# Patient Record
Sex: Female | Born: 1935 | Race: White | Hispanic: No | Smoking: Former smoker
Health system: Southern US, Community
[De-identification: ages and names within clinical notes are randomized; demographics above are authoritative.]

## PROBLEM LIST (undated history)

## (undated) DIAGNOSIS — M25512 Pain in left shoulder: Secondary | ICD-10-CM

## (undated) DIAGNOSIS — R269 Unspecified abnormalities of gait and mobility: Secondary | ICD-10-CM

## (undated) DIAGNOSIS — E079 Disorder of thyroid, unspecified: Secondary | ICD-10-CM

## (undated) DIAGNOSIS — IMO0001 Reserved for inherently not codable concepts without codable children: Secondary | ICD-10-CM

## (undated) DIAGNOSIS — H9190 Unspecified hearing loss, unspecified ear: Secondary | ICD-10-CM

## (undated) DIAGNOSIS — Z720 Tobacco use: Secondary | ICD-10-CM

## (undated) DIAGNOSIS — N390 Urinary tract infection, site not specified: Secondary | ICD-10-CM

## (undated) DIAGNOSIS — I639 Cerebral infarction, unspecified: Secondary | ICD-10-CM

## (undated) DIAGNOSIS — Z993 Dependence on wheelchair: Secondary | ICD-10-CM

## (undated) DIAGNOSIS — D649 Anemia, unspecified: Secondary | ICD-10-CM

## (undated) DIAGNOSIS — J449 Chronic obstructive pulmonary disease, unspecified: Secondary | ICD-10-CM

## (undated) DIAGNOSIS — F329 Major depressive disorder, single episode, unspecified: Secondary | ICD-10-CM

## (undated) DIAGNOSIS — K219 Gastro-esophageal reflux disease without esophagitis: Secondary | ICD-10-CM

## (undated) DIAGNOSIS — F32A Depression, unspecified: Secondary | ICD-10-CM

## (undated) DIAGNOSIS — S322XXA Fracture of coccyx, initial encounter for closed fracture: Secondary | ICD-10-CM

## (undated) DIAGNOSIS — I10 Essential (primary) hypertension: Secondary | ICD-10-CM

## (undated) DIAGNOSIS — K56609 Unspecified intestinal obstruction, unspecified as to partial versus complete obstruction: Secondary | ICD-10-CM

## (undated) HISTORY — PX: CATARACT EXTRACTION: SUR2

## (undated) HISTORY — PX: HIP FRACTURE SURGERY: SHX118

## (undated) HISTORY — PX: GASTRECTOMY: SHX58

## (undated) HISTORY — PX: ABDOMINAL HYSTERECTOMY: SHX81

## (undated) HISTORY — PX: HERNIA REPAIR: SHX51

## (undated) HISTORY — PX: JOINT REPLACEMENT: SHX530

---

## 1998-05-07 ENCOUNTER — Encounter (INDEPENDENT_AMBULATORY_CARE_PROVIDER_SITE_OTHER): Payer: Self-pay | Admitting: *Deleted

## 1998-05-07 LAB — CONVERTED CEMR LAB

## 1998-05-14 ENCOUNTER — Other Ambulatory Visit: Admission: RE | Admit: 1998-05-14 | Discharge: 1998-05-14 | Payer: Self-pay | Admitting: *Deleted

## 1998-05-14 ENCOUNTER — Encounter: Admission: RE | Admit: 1998-05-14 | Discharge: 1998-05-14 | Payer: Self-pay | Admitting: Family Medicine

## 1998-06-20 ENCOUNTER — Encounter: Admission: RE | Admit: 1998-06-20 | Discharge: 1998-06-20 | Payer: Self-pay | Admitting: Family Medicine

## 1998-07-04 ENCOUNTER — Encounter: Admission: RE | Admit: 1998-07-04 | Discharge: 1998-07-04 | Payer: Self-pay | Admitting: Family Medicine

## 1998-07-23 ENCOUNTER — Encounter: Admission: RE | Admit: 1998-07-23 | Discharge: 1998-07-23 | Payer: Self-pay | Admitting: Family Medicine

## 1998-08-20 ENCOUNTER — Encounter: Admission: RE | Admit: 1998-08-20 | Discharge: 1998-08-20 | Payer: Self-pay | Admitting: Family Medicine

## 1998-10-29 ENCOUNTER — Encounter: Payer: Self-pay | Admitting: Emergency Medicine

## 1998-10-30 ENCOUNTER — Inpatient Hospital Stay (HOSPITAL_COMMUNITY): Admission: EM | Admit: 1998-10-30 | Discharge: 1998-10-31 | Payer: Self-pay | Admitting: Emergency Medicine

## 1998-11-07 ENCOUNTER — Encounter: Admission: RE | Admit: 1998-11-07 | Discharge: 1998-11-07 | Payer: Self-pay | Admitting: Family Medicine

## 1998-11-14 ENCOUNTER — Encounter: Admission: RE | Admit: 1998-11-14 | Discharge: 1998-11-14 | Payer: Self-pay | Admitting: Family Medicine

## 1998-11-21 ENCOUNTER — Encounter: Admission: RE | Admit: 1998-11-21 | Discharge: 1998-11-21 | Payer: Self-pay | Admitting: Family Medicine

## 1998-11-28 ENCOUNTER — Encounter: Admission: RE | Admit: 1998-11-28 | Discharge: 1998-11-28 | Payer: Self-pay | Admitting: Family Medicine

## 1998-12-04 ENCOUNTER — Encounter: Admission: RE | Admit: 1998-12-04 | Discharge: 1998-12-04 | Payer: Self-pay | Admitting: Family Medicine

## 1999-09-22 ENCOUNTER — Encounter: Admission: RE | Admit: 1999-09-22 | Discharge: 1999-09-22 | Payer: Self-pay | Admitting: Family Medicine

## 1999-09-29 ENCOUNTER — Encounter: Admission: RE | Admit: 1999-09-29 | Discharge: 1999-09-29 | Payer: Self-pay | Admitting: Sports Medicine

## 1999-10-06 ENCOUNTER — Encounter: Admission: RE | Admit: 1999-10-06 | Discharge: 1999-10-06 | Payer: Self-pay | Admitting: Sports Medicine

## 1999-10-21 ENCOUNTER — Encounter: Admission: RE | Admit: 1999-10-21 | Discharge: 1999-10-21 | Payer: Self-pay | Admitting: Family Medicine

## 1999-10-30 ENCOUNTER — Emergency Department (HOSPITAL_COMMUNITY): Admission: EM | Admit: 1999-10-30 | Discharge: 1999-10-30 | Payer: Self-pay | Admitting: Emergency Medicine

## 1999-10-30 ENCOUNTER — Encounter: Payer: Self-pay | Admitting: Emergency Medicine

## 1999-11-12 ENCOUNTER — Encounter: Admission: RE | Admit: 1999-11-12 | Discharge: 1999-11-12 | Payer: Self-pay | Admitting: Family Medicine

## 1999-11-12 ENCOUNTER — Encounter: Admission: RE | Admit: 1999-11-12 | Discharge: 1999-11-12 | Payer: Self-pay | Admitting: Sports Medicine

## 1999-11-12 ENCOUNTER — Encounter: Payer: Self-pay | Admitting: Sports Medicine

## 1999-12-09 ENCOUNTER — Encounter: Admission: RE | Admit: 1999-12-09 | Discharge: 1999-12-09 | Payer: Self-pay | Admitting: Family Medicine

## 1999-12-10 ENCOUNTER — Encounter: Payer: Self-pay | Admitting: Sports Medicine

## 1999-12-10 ENCOUNTER — Encounter: Admission: RE | Admit: 1999-12-10 | Discharge: 1999-12-10 | Payer: Self-pay | Admitting: Sports Medicine

## 2000-12-16 ENCOUNTER — Encounter: Admission: RE | Admit: 2000-12-16 | Discharge: 2000-12-16 | Payer: Self-pay | Admitting: Family Medicine

## 2001-01-26 ENCOUNTER — Emergency Department (HOSPITAL_COMMUNITY): Admission: EM | Admit: 2001-01-26 | Discharge: 2001-01-26 | Payer: Self-pay | Admitting: Emergency Medicine

## 2001-01-26 ENCOUNTER — Encounter: Payer: Self-pay | Admitting: Emergency Medicine

## 2003-05-15 ENCOUNTER — Emergency Department (HOSPITAL_COMMUNITY): Admission: EM | Admit: 2003-05-15 | Discharge: 2003-05-16 | Payer: Self-pay

## 2003-05-16 ENCOUNTER — Encounter: Payer: Self-pay | Admitting: Emergency Medicine

## 2005-03-03 ENCOUNTER — Observation Stay (HOSPITAL_COMMUNITY): Admission: EM | Admit: 2005-03-03 | Discharge: 2005-03-05 | Payer: Self-pay | Admitting: Emergency Medicine

## 2006-11-04 ENCOUNTER — Encounter (INDEPENDENT_AMBULATORY_CARE_PROVIDER_SITE_OTHER): Payer: Self-pay | Admitting: *Deleted

## 2011-04-19 ENCOUNTER — Emergency Department (HOSPITAL_COMMUNITY): Payer: Medicare Other

## 2011-04-19 ENCOUNTER — Inpatient Hospital Stay (HOSPITAL_COMMUNITY)
Admission: EM | Admit: 2011-04-19 | Discharge: 2011-04-23 | DRG: 689 | Disposition: A | Discharge status: Home Health | Payer: Medicare Other | Attending: Emergency Medicine | Admitting: Emergency Medicine

## 2011-04-19 DIAGNOSIS — G119 Hereditary ataxia, unspecified: Secondary | ICD-10-CM | POA: Diagnosis present

## 2011-04-19 DIAGNOSIS — E039 Hypothyroidism, unspecified: Secondary | ICD-10-CM | POA: Diagnosis present

## 2011-04-19 DIAGNOSIS — R5381 Other malaise: Secondary | ICD-10-CM | POA: Diagnosis present

## 2011-04-19 DIAGNOSIS — R1310 Dysphagia, unspecified: Secondary | ICD-10-CM | POA: Diagnosis present

## 2011-04-19 DIAGNOSIS — S32509A Unspecified fracture of unspecified pubis, initial encounter for closed fracture: Secondary | ICD-10-CM | POA: Diagnosis present

## 2011-04-19 DIAGNOSIS — I2510 Atherosclerotic heart disease of native coronary artery without angina pectoris: Secondary | ICD-10-CM | POA: Diagnosis present

## 2011-04-19 DIAGNOSIS — K279 Peptic ulcer, site unspecified, unspecified as acute or chronic, without hemorrhage or perforation: Secondary | ICD-10-CM | POA: Diagnosis present

## 2011-04-19 DIAGNOSIS — I10 Essential (primary) hypertension: Secondary | ICD-10-CM | POA: Diagnosis present

## 2011-04-19 DIAGNOSIS — G92 Toxic encephalopathy: Secondary | ICD-10-CM | POA: Diagnosis present

## 2011-04-19 DIAGNOSIS — K219 Gastro-esophageal reflux disease without esophagitis: Secondary | ICD-10-CM | POA: Diagnosis present

## 2011-04-19 DIAGNOSIS — R627 Adult failure to thrive: Secondary | ICD-10-CM | POA: Diagnosis present

## 2011-04-19 DIAGNOSIS — Z7982 Long term (current) use of aspirin: Secondary | ICD-10-CM

## 2011-04-19 DIAGNOSIS — G929 Unspecified toxic encephalopathy: Secondary | ICD-10-CM | POA: Diagnosis present

## 2011-04-19 DIAGNOSIS — E876 Hypokalemia: Secondary | ICD-10-CM | POA: Diagnosis present

## 2011-04-19 DIAGNOSIS — N12 Tubulo-interstitial nephritis, not specified as acute or chronic: Principal | ICD-10-CM | POA: Diagnosis present

## 2011-04-19 DIAGNOSIS — E785 Hyperlipidemia, unspecified: Secondary | ICD-10-CM | POA: Diagnosis present

## 2011-04-19 DIAGNOSIS — D649 Anemia, unspecified: Secondary | ICD-10-CM | POA: Diagnosis present

## 2011-04-19 LAB — DIFFERENTIAL
Basophils Absolute: 0 10*3/uL (ref 0.0–0.1)
Basophils Relative: 0 % (ref 0–1)
Lymphocytes Relative: 10 % — ABNORMAL LOW (ref 12–46)
Neutro Abs: 7.8 10*3/uL — ABNORMAL HIGH (ref 1.7–7.7)
Neutrophils Relative %: 78 % — ABNORMAL HIGH (ref 43–77)

## 2011-04-19 LAB — POCT I-STAT, CHEM 8
HCT: 35 % — ABNORMAL LOW (ref 36.0–46.0)
Hemoglobin: 11.9 g/dL — ABNORMAL LOW (ref 12.0–15.0)
Potassium: 3.3 mEq/L — ABNORMAL LOW (ref 3.5–5.1)
Sodium: 140 mEq/L (ref 135–145)
TCO2: 29 mmol/L (ref 0–100)

## 2011-04-19 LAB — CBC
HCT: 35 % — ABNORMAL LOW (ref 36.0–46.0)
Hemoglobin: 11.3 g/dL — ABNORMAL LOW (ref 12.0–15.0)
MCHC: 32.3 g/dL (ref 30.0–36.0)
RBC: 3.95 MIL/uL (ref 3.87–5.11)
WBC: 10.1 10*3/uL (ref 4.0–10.5)

## 2011-04-20 ENCOUNTER — Inpatient Hospital Stay (HOSPITAL_COMMUNITY): Payer: Medicare Other

## 2011-04-20 LAB — IRON AND TIBC
Iron: 13 ug/dL — ABNORMAL LOW (ref 42–135)
UIBC: 194 ug/dL

## 2011-04-20 LAB — FERRITIN: Ferritin: 191 ng/mL (ref 10–291)

## 2011-04-20 LAB — URINALYSIS, ROUTINE W REFLEX MICROSCOPIC
Nitrite: POSITIVE — AB
Protein, ur: 30 mg/dL — AB
Urobilinogen, UA: 1 mg/dL (ref 0.0–1.0)

## 2011-04-20 LAB — FOLATE: Folate: 12.2 ng/mL

## 2011-04-20 LAB — LIPID PANEL
Cholesterol: 155 mg/dL (ref 0–200)
Total CHOL/HDL Ratio: 4.2 RATIO
Triglycerides: 123 mg/dL (ref <150)

## 2011-04-20 LAB — URINE MICROSCOPIC-ADD ON

## 2011-04-20 LAB — CARDIAC PANEL(CRET KIN+CKTOT+MB+TROPI)
Relative Index: INVALID (ref 0.0–2.5)
Total CK: 11 U/L (ref 7–177)

## 2011-04-21 LAB — CBC
Hemoglobin: 10.4 g/dL — ABNORMAL LOW (ref 12.0–15.0)
MCH: 28.3 pg (ref 26.0–34.0)
MCV: 89.4 fL (ref 78.0–100.0)
Platelets: 203 10*3/uL (ref 150–400)
RBC: 3.67 MIL/uL — ABNORMAL LOW (ref 3.87–5.11)

## 2011-04-21 LAB — BASIC METABOLIC PANEL
CO2: 29 mEq/L (ref 19–32)
Calcium: 10.4 mg/dL (ref 8.4–10.5)
Creatinine, Ser: <0.47 mg/dL — ABNORMAL LOW (ref 0.50–1.10)
Glucose, Bld: 113 mg/dL — ABNORMAL HIGH (ref 70–99)

## 2011-04-22 LAB — CBC
Platelets: 224 10*3/uL (ref 150–400)
RBC: 3.83 MIL/uL — ABNORMAL LOW (ref 3.87–5.11)
WBC: 7.2 10*3/uL (ref 4.0–10.5)

## 2011-04-22 LAB — COMPREHENSIVE METABOLIC PANEL
ALT: 7 U/L (ref 0–35)
AST: 9 U/L (ref 0–37)
CO2: 27 mEq/L (ref 19–32)
Calcium: 10.3 mg/dL (ref 8.4–10.5)
Chloride: 106 mEq/L (ref 96–112)
Sodium: 138 mEq/L (ref 135–145)
Total Bilirubin: 0.2 mg/dL — ABNORMAL LOW (ref 0.3–1.2)

## 2011-04-23 LAB — URINE CULTURE

## 2011-05-25 NOTE — Discharge Summary (Signed)
April Mccoy, RIDGELY NO.:  [id number]  MEDICAL RECORD NO.:  [id number]  LOCATION:  3013                         FACILITY:  [facility]  PHYSICIAN:  Salman Wellen, DO         DATE OF BIRTH:  07/18/1929  DATE OF ADMISSION:  04/19/2011 DATE OF DISCHARGE:  04/23/2011                              DISCHARGE SUMMARY   ADMISSION DIAGNOSES:  Generalized weakness, failure to thrive, dysphasia, coronary artery disease, hypertension, gastroesophageal reflux disease, peptic ulcer disease, cerebellar ataxia, hyperlipidemia, urinary tract infection, normocytic anemia, and hypokalemia.  The patient was admitted, was given IV antibiotics.  Speech Pathology was consulted for the patient's dysphasia.  She was found to have a stage II dysphasia.  The consistencies for diet were changed and measures were taken to lower her risk of aspiration.  The patient was also found to be dehydrated.  She was given IV fluids.  She also had some thrush and was given Diflucan for that likely due to her malnutrition.  The patient was evaluated by Physical Therapy.  Their recommendation was for SNF as of her evaluation on April 22, 2011.  The patient was noted to have some difficulty due to her ataxia and difficulty with foot placement.  She has a flat footed gait pattern and that she required prompting for safety and hand placement with rolling walker.  The patient was ultimately diagnosed with pyelonephritis.  Her antibiotics were continued.  Her delirium cleared.  Her hypercalcemia cleared with IV fluids.  I spoke with the patient's family this morning and strongly recommended SNF for this patient versus 24 hours supervision and outpatient PT, OT and ST.  The daughter, [name], whom I spoke to this morning was in agreement and stated that she would counsel the patient to accept SNF.  I discussed this with the patient in detail and the patient seemed to agree at the time.  Since received notification  back from nursing, the patient's family now refuses SNF, the patient refuses SNF and she wishes to go home.  Nutrition has been by to assess her in the meantime.  They found her to be somewhat malnourished and recommended Ensure t.i.d.  This patient will be discharged to home today in fair condition.  She is discharged to her family who will provide 24 hours supervision.  The patient's ambulation is to be with assistance only with a rolling walker.  She is to have home health PT, OT and ST and a home health aide.  MEDICATIONS AT HOME: 1. Keflex 500 mg one p.o. b.i.d. times 4 days. 2. Aspirin 81 mg one p.o. daily. 3. Benazepril 10 mg one p.o. daily. 4. Lexapro 5 mg one p.o. daily. 5. Synthroid 25 mcg one p.o. daily. 6. Omeprazole 40 mg one p.o. daily. 7. Percocet 5/325 one p.o. q. 6 h. p.r.n. pain.  She is to follow up with her PCP in 2-4 weeks.  DIET:  Cardiac.  She is to have Ensure 1 can t.i.d. with meals.  DISCHARGE DIAGNOSES:  Pyelonephritis, delirium which is resolved, hypercalcemia secondary to dehydration, resolved, chronic dysphasia, generalized weakness, ataxic gait.  I spent 40 minutes on this discharge.  ______________________________ Fran Lowes, DO     AS/MEDQ  D:  04/23/2011  T:  04/23/2011  Job:  [id number]  Electronically Signed by Fran Lowes DO on 05/25/2011 10:35:26 PM

## 2011-05-25 NOTE — Discharge Summary (Signed)
  NAMELITSY, EPTING NO.:  [id number]  MEDICAL RECORD NO.:  [id number]  LOCATION:  3013                         FACILITY:  [facility]  PHYSICIAN:  Jeronimo Hellberg, DO         DATE OF BIRTH:  07/22/1929  DATE OF ADMISSION:  04/19/2011 DATE OF DISCHARGE:  04/23/2011                              DISCHARGE SUMMARY   ADDENDUM  The patient has a stage II dysphasia.  Aspiration precautions include mechanical soft diet, no straws, nectar thick liquids.  Pills are to be administered whole in applesauce.  The patient is to sit up at 90 degrees during meals and for 1 hour afterwards.  The patient is to follow swallows, follow each bite with free swallows.  She is to have small bites followed by small sips of liquids.  She is to eat slowly.  DISCHARGE MEDICATIONS:  Thick-It.  This is to be added to thin liquids to a nectar-thick consistency whenever she drinks liquids.          ______________________________ Fran Lowes, DO    AS/MEDQ  D:  04/23/2011  T:  04/23/2011  Job:  [id number]  Electronically Signed by Fran Lowes DO on 05/25/2011 10:35:29 PM

## 2011-06-19 NOTE — H&P (Signed)
NAMERENDA, POHLMAN NO.:  [id number]  MEDICAL RECORD NO.:  [id number]  LOCATION:  3733                         FACILITY:  [facility]  PHYSICIAN:  Lonia Blood, M.D.      DATE OF BIRTH:  02-20-1929  DATE OF ADMISSION:  04/19/2011 DATE OF DISCHARGE:                             HISTORY & PHYSICAL   PRIMARY CARE PHYSICIAN:  She is unassigned to [place].  PRESENTING COMPLAINT:  Fever, nausea, and weakness.  HISTORY OF PRESENT ILLNESS:  The patient is an 77 year old female who was apparently discharged from [facility] only recently.  She went home and staying with family.  She left 10 days ago and initially she was doing fine, but now has generalized weakness and poor oral intake today.  She has been lethargic according to the family.  She had some abdominal pain.  She had 1 episode of vomiting prior to arrival in the ED.  She has had fever but no chills and these all started only recently.  PAST MEDICAL HISTORY:  Significant for coronary artery disease, hypertension, GERD, hypothyroidism, history of peptic ulcer disease, history of cerebellar ataxia, hyperlipidemia, among other things.  ALLERGIES:  No known drug allergies.  CURRENT MEDICATIONS:  Include aspirin, benazepril, citalopram, Levothroid, and Prilosec.  SOCIAL HISTORY:  The patient currently lives in [place] since she left the [facility] 10 days ago.  She has people coming in to help out during the day.  No tobacco, alcohol, or IV drug use.  FAMILY HISTORY:  Noncontributory due to the patient's age.  REVIEW OF SYSTEMS:  Generalized weakness, otherwise all systems reviewed and negative except per HPI.  PHYSICAL EXAMINATION:  VITAL SIGNS:  T-max here 101, blood pressure 129/77 with pulse 86, respiratory rate 16, sats 94% on room air. GENERAL:  She is awake, alert, oriented, in no obvious distress, but looks weak globally. HEENT:  PERRLA.  EOMI.  No pallor, no jaundice, no rhinorrhea. NECK:   Supple.  No visible JVD, no lymphadenopathy, no carotid bruits. RESPIRATORY:  Good air entry bilaterally.  No significant wheezing or rales. CARDIOVASCULAR:  She has S1 and S2.  No murmur. ABDOMEN:  Soft, full, nontender with positive bowel sounds. EXTREMITIES:  No edema, cyanosis, or clubbing. SKIN:  No rashes or ulcers.  LABS:  Sodium is 140, potassium 3.3, chloride 102, BUN 17, creatinine 0.80 with glucose 118, and ionized calcium 1.37.  White count 10.1 with left shift ANC of 7.8, her hemoglobin is 11.3 with platelet count of 222.  Urinalysis showed cloudy urine with moderate hemoglobin, some proteinuria, positive nitrite, small leukocyte esterase.  Urine wbc's 7- 10, rbc's 3-6 with many bacteria.  Chest x-ray showed emphysematous changes in the lungs, but no active disease.  Head CT without contrast showed periventricular and corona radiata white matter hypodensities, compatible with chronic ischemic white matter change.  Otherwise, no significant change.  ASSESSMENT:  This is an 77 year old female, presenting with fever, weakness, and dysphagia.  She is apparently failed swallow evaluation in the ED.  From all indication, this is coming from urinary tract infection.  She may have actual pyelo based on her presentation with abdominal pain and CVA tenderness.  PLAN:  1. Generalized weakness.  We will treat underlying cause, hydrate the     patient, giver her some antibiotics, replete her potassium.  She     will get PT/OT consult.  She just left the rehab and may be able to     go back or at least go home with some Home Health.  We will get     PT/OT to make recommendations. 2. Failure to thrive.  The patient again had deteriorated apparently     since going home.  We will address her underlying problems and then     see if that improves her condition. 3. UTI/pyelonephritis.  I will keep her on IV Rocephin until we get     blood cultures and urine cultures back, then we can  narrow down her     antibiotic. 4. Hypothyroidism.  Check her TSH level and continue levothyroxine. 5. GERD.  Continue with PPI. 6. Hypertension, seems controlled.  Continue with current medications. 7. Normocytic anemia.  We will check anemia panel. 8. Hypokalemia.  Again, we will replete her potassium. 9. Dysphagia.  I will her keep her n.p.o. until she will get formal     speech therapy evaluation for swallowing.  This may all be due to     some weakness from her UTI and general medical condition and she     might improve after antibiotics and hydration.     Lonia Blood, M.D.     Verlin Grills  D:  04/20/2011  T:  04/20/2011  Job:  [id number]  Electronically Signed by Lonia Blood M.D. on 06/19/2011 02:54:33 PM

## 2014-08-13 ENCOUNTER — Observation Stay (HOSPITAL_COMMUNITY): Payer: Medicare Other

## 2014-08-13 ENCOUNTER — Encounter (HOSPITAL_COMMUNITY): Payer: Self-pay | Admitting: Emergency Medicine

## 2014-08-13 ENCOUNTER — Emergency Department (HOSPITAL_COMMUNITY): Payer: Medicare Other

## 2014-08-13 ENCOUNTER — Observation Stay (HOSPITAL_COMMUNITY)
Admission: EM | Admit: 2014-08-13 | Discharge: 2014-08-15 | Disposition: A | Discharge status: Home / Self Care | Payer: Medicare Other | Attending: Internal Medicine | Admitting: Internal Medicine

## 2014-08-13 DIAGNOSIS — F329 Major depressive disorder, single episode, unspecified: Secondary | ICD-10-CM | POA: Insufficient documentation

## 2014-08-13 DIAGNOSIS — R1310 Dysphagia, unspecified: Secondary | ICD-10-CM

## 2014-08-13 DIAGNOSIS — Z7982 Long term (current) use of aspirin: Secondary | ICD-10-CM | POA: Insufficient documentation

## 2014-08-13 DIAGNOSIS — M8580 Other specified disorders of bone density and structure, unspecified site: Secondary | ICD-10-CM | POA: Diagnosis present

## 2014-08-13 DIAGNOSIS — E079 Disorder of thyroid, unspecified: Secondary | ICD-10-CM | POA: Diagnosis present

## 2014-08-13 DIAGNOSIS — R269 Unspecified abnormalities of gait and mobility: Secondary | ICD-10-CM

## 2014-08-13 DIAGNOSIS — R072 Precordial pain: Principal | ICD-10-CM | POA: Insufficient documentation

## 2014-08-13 DIAGNOSIS — Z720 Tobacco use: Secondary | ICD-10-CM | POA: Insufficient documentation

## 2014-08-13 DIAGNOSIS — S2249XA Multiple fractures of ribs, unspecified side, initial encounter for closed fracture: Secondary | ICD-10-CM | POA: Diagnosis present

## 2014-08-13 DIAGNOSIS — Z87440 Personal history of urinary (tract) infections: Secondary | ICD-10-CM | POA: Diagnosis not present

## 2014-08-13 DIAGNOSIS — J441 Chronic obstructive pulmonary disease with (acute) exacerbation: Secondary | ICD-10-CM | POA: Insufficient documentation

## 2014-08-13 DIAGNOSIS — M549 Dorsalgia, unspecified: Secondary | ICD-10-CM | POA: Diagnosis present

## 2014-08-13 DIAGNOSIS — R7989 Other specified abnormal findings of blood chemistry: Secondary | ICD-10-CM

## 2014-08-13 DIAGNOSIS — J449 Chronic obstructive pulmonary disease, unspecified: Secondary | ICD-10-CM | POA: Diagnosis present

## 2014-08-13 DIAGNOSIS — D649 Anemia, unspecified: Secondary | ICD-10-CM | POA: Diagnosis not present

## 2014-08-13 DIAGNOSIS — J410 Simple chronic bronchitis: Secondary | ICD-10-CM

## 2014-08-13 DIAGNOSIS — K219 Gastro-esophageal reflux disease without esophagitis: Secondary | ICD-10-CM | POA: Diagnosis not present

## 2014-08-13 DIAGNOSIS — R079 Chest pain, unspecified: Secondary | ICD-10-CM | POA: Diagnosis present

## 2014-08-13 DIAGNOSIS — R109 Unspecified abdominal pain: Secondary | ICD-10-CM | POA: Diagnosis present

## 2014-08-13 DIAGNOSIS — R0781 Pleurodynia: Secondary | ICD-10-CM

## 2014-08-13 DIAGNOSIS — I10 Essential (primary) hypertension: Secondary | ICD-10-CM | POA: Diagnosis present

## 2014-08-13 DIAGNOSIS — Z79899 Other long term (current) drug therapy: Secondary | ICD-10-CM | POA: Insufficient documentation

## 2014-08-13 DIAGNOSIS — Z8673 Personal history of transient ischemic attack (TIA), and cerebral infarction without residual deficits: Secondary | ICD-10-CM | POA: Diagnosis not present

## 2014-08-13 DIAGNOSIS — F32A Depression, unspecified: Secondary | ICD-10-CM | POA: Diagnosis present

## 2014-08-13 HISTORY — DX: Depression, unspecified: F32.A

## 2014-08-13 HISTORY — DX: Major depressive disorder, single episode, unspecified: F32.9

## 2014-08-13 HISTORY — DX: Dependence on wheelchair: Z99.3

## 2014-08-13 HISTORY — DX: Essential (primary) hypertension: I10

## 2014-08-13 HISTORY — DX: Anemia, unspecified: D64.9

## 2014-08-13 HISTORY — DX: Urinary tract infection, site not specified: N39.0

## 2014-08-13 HISTORY — DX: Unspecified abnormalities of gait and mobility: R26.9

## 2014-08-13 HISTORY — DX: Fracture of coccyx, initial encounter for closed fracture: S32.2XXA

## 2014-08-13 HISTORY — DX: Disorder of thyroid, unspecified: E07.9

## 2014-08-13 HISTORY — DX: Gastro-esophageal reflux disease without esophagitis: K21.9

## 2014-08-13 HISTORY — DX: Cerebral infarction, unspecified: I63.9

## 2014-08-13 HISTORY — DX: Unspecified hearing loss, unspecified ear: H91.90

## 2014-08-13 HISTORY — DX: Pain in left shoulder: M25.512

## 2014-08-13 HISTORY — DX: Chronic obstructive pulmonary disease, unspecified: J44.9

## 2014-08-13 HISTORY — DX: Reserved for inherently not codable concepts without codable children: IMO0001

## 2014-08-13 HISTORY — DX: Unspecified intestinal obstruction, unspecified as to partial versus complete obstruction: K56.609

## 2014-08-13 HISTORY — DX: Tobacco use: Z72.0

## 2014-08-13 LAB — CBC
HCT: 39.7 % (ref 36.0–46.0)
Hemoglobin: 11.8 g/dL — ABNORMAL LOW (ref 12.0–15.0)
MCH: 25.2 pg — ABNORMAL LOW (ref 26.0–34.0)
MCHC: 29.7 g/dL — AB (ref 30.0–36.0)
MCV: 84.6 fL (ref 78.0–100.0)
PLATELETS: 238 10*3/uL (ref 150–400)
RBC: 4.69 MIL/uL (ref 3.87–5.11)
RDW: 13.7 % (ref 11.5–15.5)
WBC: 8 10*3/uL (ref 4.0–10.5)

## 2014-08-13 LAB — LIPID PANEL
CHOL/HDL RATIO: 4.7 ratio
CHOLESTEROL: 192 mg/dL (ref 0–200)
HDL: 41 mg/dL (ref >39)
LDL Cholesterol: 124 mg/dL — ABNORMAL HIGH (ref 0–99)
Triglycerides: 133 mg/dL (ref <150)
VLDL: 27 mg/dL (ref 0–40)

## 2014-08-13 LAB — RAPID URINE DRUG SCREEN, HOSP PERFORMED
Amphetamines: NOT DETECTED
Barbiturates: NOT DETECTED
Benzodiazepines: NOT DETECTED
Cocaine: NOT DETECTED
Opiates: NOT DETECTED
TETRAHYDROCANNABINOL: NOT DETECTED

## 2014-08-13 LAB — COMPREHENSIVE METABOLIC PANEL
ALBUMIN: 3.7 g/dL (ref 3.5–5.2)
ALT: 20 U/L (ref 0–35)
AST: 15 U/L (ref 0–37)
Alkaline Phosphatase: 104 U/L (ref 39–117)
Anion gap: 10 (ref 5–15)
BUN: 32 mg/dL — ABNORMAL HIGH (ref 6–23)
CO2: 26 meq/L (ref 19–32)
CREATININE: 0.65 mg/dL (ref 0.50–1.10)
Calcium: 10.4 mg/dL (ref 8.4–10.5)
Chloride: 105 mEq/L (ref 96–112)
GFR calc Af Amer: >90 mL/min (ref >90)
GFR, EST NON AFRICAN AMERICAN: 80 mL/min — AB (ref >90)
Glucose, Bld: 91 mg/dL (ref 70–99)
Potassium: 4.6 mEq/L (ref 3.7–5.3)
Sodium: 141 mEq/L (ref 137–147)
Total Bilirubin: 0.3 mg/dL (ref 0.3–1.2)
Total Protein: 7.1 g/dL (ref 6.0–8.3)

## 2014-08-13 LAB — TROPONIN I
Troponin I: <0.3 ng/mL (ref <0.30)
Troponin I: <0.3 ng/mL (ref <0.30)
Troponin I: <0.3 ng/mL (ref <0.30)

## 2014-08-13 LAB — I-STAT TROPONIN, ED: TROPONIN I, POC: 0.02 ng/mL (ref 0.00–0.08)

## 2014-08-13 LAB — HEMOGLOBIN A1C
Hgb A1c MFr Bld: 5.9 % — ABNORMAL HIGH (ref <5.7)
Mean Plasma Glucose: 123 mg/dL — ABNORMAL HIGH (ref <117)

## 2014-08-13 LAB — TSH: TSH: 1.99 u[IU]/mL (ref 0.350–4.500)

## 2014-08-13 LAB — D-DIMER, QUANTITATIVE: D-Dimer, Quant: 1.91 ug/mL-FEU — ABNORMAL HIGH (ref 0.00–0.48)

## 2014-08-13 LAB — PRO B NATRIURETIC PEPTIDE: Pro B Natriuretic peptide (BNP): 230.3 pg/mL (ref 0–450)

## 2014-08-13 MED ORDER — POLYETHYLENE GLYCOL 3350 17 G PO PACK: 17.0000 g | PACK | Freq: Every day | ORAL | Status: DC | PRN

## 2014-08-13 MED ORDER — ASPIRIN 81 MG PO CHEW
324.0000 mg | CHEWABLE_TABLET | Freq: Once | ORAL | Status: AC
  Administered 2014-08-13: 324 mg via ORAL
  Filled 2014-08-13: qty 4

## 2014-08-13 MED ORDER — ESCITALOPRAM OXALATE 10 MG PO TABS
5.0000 mg | ORAL_TABLET | Freq: Every day | ORAL | Status: DC
  Administered 2014-08-13 – 2014-08-15 (×3): 5 mg via ORAL
  Filled 2014-08-13 (×3): qty 1

## 2014-08-13 MED ORDER — ENSURE COMPLETE PO LIQD
237.0000 mL | Freq: Two times a day (BID) | ORAL | Status: DC
  Administered 2014-08-14 – 2014-08-15 (×2): 237 mL via ORAL

## 2014-08-13 MED ORDER — SODIUM CHLORIDE 0.9 % IJ SOLN
3.0000 mL | Freq: Two times a day (BID) | INTRAMUSCULAR | Status: DC
  Administered 2014-08-13 – 2014-08-14 (×2): 3 mL via INTRAVENOUS

## 2014-08-13 MED ORDER — NYSTATIN 100000 UNIT/GM EX POWD
Freq: Three times a day (TID) | CUTANEOUS | Status: DC
  Administered 2014-08-14 (×3): via TOPICAL
  Administered 2014-08-15: 1 via TOPICAL
  Filled 2014-08-13: qty 15

## 2014-08-13 MED ORDER — DM-GUAIFENESIN ER 30-600 MG PO TB12
1.0000 | ORAL_TABLET | Freq: Two times a day (BID) | ORAL | Status: DC
  Administered 2014-08-14 – 2014-08-15 (×2): 1 via ORAL
  Filled 2014-08-13 (×3): qty 1

## 2014-08-13 MED ORDER — IOHEXOL 350 MG/ML SOLN
100.0000 mL | Freq: Once | INTRAVENOUS | Status: AC | PRN
  Administered 2014-08-13: 100 mL via INTRAVENOUS

## 2014-08-13 MED ORDER — PANTOPRAZOLE SODIUM 40 MG PO TBEC
40.0000 mg | DELAYED_RELEASE_TABLET | Freq: Every day | ORAL | Status: DC
  Administered 2014-08-13 – 2014-08-15 (×3): 40 mg via ORAL
  Filled 2014-08-13 (×3): qty 1

## 2014-08-13 MED ORDER — NICOTINE 21 MG/24HR TD PT24
21.0000 mg | MEDICATED_PATCH | Freq: Every day | TRANSDERMAL | Status: DC
  Administered 2014-08-14 – 2014-08-15 (×2): 21 mg via TRANSDERMAL
  Filled 2014-08-13 (×2): qty 1

## 2014-08-13 MED ORDER — LEVOTHYROXINE SODIUM 25 MCG PO TABS
25.0000 ug | ORAL_TABLET | Freq: Every day | ORAL | Status: DC
  Administered 2014-08-14 – 2014-08-15 (×2): 25 ug via ORAL
  Filled 2014-08-13 (×2): qty 1

## 2014-08-13 MED ORDER — ASPIRIN EC 81 MG PO TBEC
81.0000 mg | DELAYED_RELEASE_TABLET | Freq: Every day | ORAL | Status: DC
  Administered 2014-08-14 – 2014-08-15 (×2): 81 mg via ORAL
  Filled 2014-08-13 (×2): qty 1

## 2014-08-13 MED ORDER — IPRATROPIUM-ALBUTEROL 0.5-2.5 (3) MG/3ML IN SOLN: 3.0000 mL | Freq: Four times a day (QID) | RESPIRATORY_TRACT | Status: DC | PRN

## 2014-08-13 MED ORDER — ACETAMINOPHEN 650 MG RE SUPP: 650.0000 mg | Freq: Four times a day (QID) | RECTAL | Status: DC | PRN

## 2014-08-13 MED ORDER — ACETAMINOPHEN 325 MG PO TABS: 650.0000 mg | ORAL_TABLET | Freq: Four times a day (QID) | ORAL | Status: DC | PRN

## 2014-08-13 MED ORDER — BENAZEPRIL HCL 5 MG PO TABS
5.0000 mg | ORAL_TABLET | Freq: Every day | ORAL | Status: DC
  Administered 2014-08-14 – 2014-08-15 (×2): 5 mg via ORAL
  Filled 2014-08-13 (×3): qty 1

## 2014-08-13 MED ORDER — ENOXAPARIN SODIUM 40 MG/0.4ML ~~LOC~~ SOLN
40.0000 mg | SUBCUTANEOUS | Status: DC
  Administered 2014-08-13 – 2014-08-14 (×2): 40 mg via SUBCUTANEOUS
  Filled 2014-08-13 (×2): qty 0.4

## 2014-08-13 MED ORDER — IPRATROPIUM-ALBUTEROL 0.5-2.5 (3) MG/3ML IN SOLN
3.0000 mL | RESPIRATORY_TRACT | Status: DC
  Administered 2014-08-13: 3 mL via RESPIRATORY_TRACT
  Filled 2014-08-13: qty 3

## 2014-08-13 NOTE — ED Notes (Signed)
Pt returned from xray; coffee given to family; call bell at bedside.

## 2014-08-13 NOTE — ED Notes (Signed)
PT in xray 

## 2014-08-13 NOTE — ED Notes (Signed)
Pt reports waking up with centralized chest pain; hurts with palpation and deep inspiration. Also c/o right sided flank pain from where she fell a week ago. Pt lives alone at home.

## 2014-08-13 NOTE — ED Provider Notes (Signed)
CSN: [id number]     Arrival date & time 08/13/14  [id number] History   First MD Initiated Contact with Patient 08/13/14 [phone]     Chief Complaint  Patient presents with  . Chest Pain     (Consider location/radiation/quality/duration/timing/severity/associated sxs/prior Treatment) HPI  The patient reports right sided chest pain for a week. The patient had a fall approximately a week ago. The pain is worse with pressure and with movement. She reports this morning she awoke with a central chest pain which was new for her. It has a pressure quality to it. She denies associated symptoms. The patient reports she has had a cough for "a long time". She does cough up mucus intermittently. She denies fever. The patient denies increased short of breath. The patient endorses nausea but denies vomiting or diarrhea. She denies abdominal pain. The patient has a long smoking history. She reports she has had to cut back over the past year. Now she reports she only smokes intermittently. Past Medical History  Diagnosis Date  . Hypertension   . Stroke     with h/o dysphagia post stroke  . Thyroid disease   . Left shoulder pain     DJD  . UTI (lower urinary tract infection)     Enterobacter aerogenes  . COPD (chronic obstructive pulmonary disease)   . GERD (gastroesophageal reflux disease)   . Abnormality of gait   . Hard of hearing   . Anemia   . Depression   . Tobacco abuse    Past Surgical History  Procedure Laterality Date  . Hernia repair    . Hip fracture surgery    . Joint replacement    . Gastrectomy    . Other surgical history      hysterectomy    Family History  Problem Relation Age of Onset  . Heart disease Mother   . Hypertension Mother   . Alcohol abuse Father   . Cancer      sister, brother, daughter x 2   History  Substance Use Topics  . Smoking status: Current Some Day Smoker  . Smokeless tobacco: Not on file  . Alcohol Use: Not on file   OB History    No data available      Review of Systems 10 Systems reviewed and are negative for acute change except as noted in the HPI.    Allergies  Review of patient's allergies indicates no known allergies.  Home Medications   Prior to Admission medications   Medication Sig Start Date End Date Taking? Authorizing Provider  aspirin EC 81 MG tablet Take 81 mg by mouth daily.   Yes Historical Provider, MD  benazepril (LOTENSIN) 10 MG tablet Take 5 mg by mouth. 02/28/14  Yes Historical Provider, MD  ciprofloxacin (CIPRO) 500 MG tablet TAKE 1 TABLET BY MOUTH TWO TIMES A DAY. FOR 10 DAYS 04/26/14  Yes Historical Provider, MD  escitalopram (LEXAPRO) 5 MG tablet Take 5 mg by mouth daily.   Yes Historical Provider, MD  levothyroxine (SYNTHROID, LEVOTHROID) 25 MCG tablet Take 25 mcg by mouth daily before breakfast.   Yes Historical Provider, MD  nystatin (MYCOSTATIN) powder Apply to affected area 3 times daily 02/28/14 02/28/15 Yes Historical Provider, MD  omeprazole (PRILOSEC) 40 MG capsule Take 40 mg by mouth daily.   Yes Historical Provider, MD  polyethylene glycol (MIRALAX / GLYCOLAX) packet Take by mouth. 11/28/13  Yes Historical Provider, MD  traMADol (ULTRAM) 50 MG tablet Take 50 mg by mouth  every 6 (six) hours as needed (for back pain).   Yes Historical Provider, MD  alendronate (FOSAMAX) 70 MG tablet  07/31/14   Historical Provider, MD  amoxicillin (AMOXIL) 875 MG tablet  06/30/14   Historical Provider, MD  ipratropium-albuterol (DUONEB) 0.5-2.5 (3) MG/3ML SOLN  07/25/14   Historical Provider, MD   BP 125/67 mmHg  Pulse 77  Temp(Src) 98.3 F (36.8 C) (Oral)  Resp 25  SpO2 89% Physical Exam  Constitutional: She is oriented to person, place, and time. She appears well-nourished. No distress.  HENT:  Head: Normocephalic and atraumatic.  Nose: Nose normal.  Mouth/Throat: Oropharynx is clear and moist.  Eyes: EOM are normal. Pupils are equal, round, and reactive to light.  Neck: Neck supple.  Cardiovascular: Normal  rate, regular rhythm, normal heart sounds and intact distal pulses.   Pulmonary/Chest: Effort normal. She has wheezes.  The patient has bilateral expiratory wheeze. Soft breath sounds at the bases consistent with COPD. Chest wall has reproducible tenderness on the right lateral and right anterior portions at approximately the fourth rib through the 10th. There is no crepitus present. No objective abrasions or contusions.  Abdominal: Soft. Bowel sounds are normal. She exhibits no distension and no mass. There is no tenderness. There is no rebound and no guarding.  Musculoskeletal: Normal range of motion. She exhibits no edema or tenderness.  Neurological: She is alert and oriented to person, place, and time. No cranial nerve deficit. Coordination normal.  Skin: Skin is warm and dry.  Psychiatric: She has a normal mood and affect.    ED Course  Procedures (including critical care time) Labs Review Labs Reviewed  CBC - Abnormal; Notable for the following:    Hemoglobin 11.8 (*)    MCH 25.2 (*)    MCHC 29.7 (*)    All other components within normal limits  COMPREHENSIVE METABOLIC PANEL - Abnormal; Notable for the following:    BUN 32 (*)    GFR calc non Af Amer 80 (*)    All other components within normal limits  PRO B NATRIURETIC PEPTIDE  TROPONIN I  TROPONIN I  TROPONIN I  URINE RAPID DRUG SCREEN (HOSP PERFORMED)  TSH  I-STAT TROPOININ, ED    Imaging Review Dg Chest 2 View  08/13/2014   CLINICAL DATA:  Chest pain  EXAM: CHEST  2 VIEW  COMPARISON:  04/19/2011  FINDINGS: Atelectasis versus airspace disease at the left base. Upper normal heart size. No pneumothorax. No pleural effusion. Osteopenia. Kyphosis of the upper thoracic spine is stable. No new compression deformities.  IMPRESSION: Atelectasis versus airspace disease at the left base.   Electronically Signed   By: Maryclare Bean M.D.   On: 08/13/2014 08:39     EKG Interpretation   Date/Time:  Tuesday August 13 2014 07:46:36  EST Ventricular Rate:  77 PR Interval:  164 QRS Duration: 85 QT Interval:  398 QTC Calculation: 450 R Axis:   8 Text Interpretation:  Sinus rhythm Borderline repolarization abnormality  no STEMI. no change Confirmed by Donnald Garre, MD, Lebron Conners [phone]) on 08/13/2014  7:54:58 AM     Consult: Patient's case is consult with internal medicine for admission. MDM   Final diagnoses:  Precordial pain  Simple chronic bronchitis    The patient has developed chest pain this morning that is acute in onset and new for her. Her daughter reports that she had a pale appearance and it is very atypical for her to wish to come to the hospital for  any condition. At this point time there is concern for possible ischemia. Her first set of cardiac enzymes and EKG do not show acute ischemic changes. The patient has COPD and at this point does not have acute respiratory distress. Chest x-ray does not show infiltrate and no fever has been reported.    Arby BarretteMarcy Maekayla Giorgio, MD 08/13/14 [phone]

## 2014-08-13 NOTE — ED Notes (Signed)
Admitting MD at bedside; snack given.

## 2014-08-13 NOTE — H&P (Signed)
Date: 08/13/2014               Patient Name:  April Mccoy MRN: [id number]  DOB: [birth year] Age / Sex: 80 y.o., female   PCP: [facility]         Medical Service: Internal Medicine Teaching Service         Attending Physician: Dr. Rogelia Boga    First Contact: Dr. Tasia Catchings Pager: [phone]  Second Contact: Dr. Delane Ginger  Pager: [phone]       After Hours (After 5p/  First Contact Pager: [phone]  weekends / holidays): Second Contact Pager: [phone]   Chief Complaint: chest pain, back pain, right flank pain  History of Present Illness:  80 y.o PMH stroke, HTN, tobacco abuse, GERD, depression anemia, dysphagia, osteopenia with fractures (osteoporosis), falls.  She presented after waking up with mid chest pain which is now improve but initially "hurt" this am was 10/10 now 5/10. Chest pain was constant w/o radiation. Nothing made the chest pain worse or better.  She had shortness of breath initially with chest pain that is now improved/resolved.  She denies nausea/vomiting or sweating.  Daughter noted paleness to skin associated with episode of chest pain this am.  Ms. Padia was scared this am and told her daughter she wanted to come to the hospital which she never wants to come in to the hospital so her daughter was concerned as well.   She also reports other symptoms.  She has a chronic cough with white mucous intermittently but her cough is worse the last couple of days.  She complains of right flank/upper quadrant pain noticed since fall within the las week. Pain is worse with with coughing and deep breathing (nothing makes better).  She has a history of falls with a recent fall a few days ago when her wheelchair flipped over while she was getting up from the toilet.  Patient also complains of mid back pain resolved now was 3/10 now 0/10.  She has had this back pain previously relieved in the past with Tylenol and nothing makes worse but it has been present for years.  ED RN checked BP  in both arms and it was 127/67 and 125/64 per report.    Meds: Current Facility-Administered Medications  Medication Dose Route Frequency Provider Last Rate Last Dose  . ipratropium-albuterol (DUONEB) 0.5-2.5 (3) MG/3ML nebulizer solution 3 mL  3 mL Nebulization Q6H PRN Annett Gula, MD       Current Outpatient Prescriptions  Medication Sig Dispense Refill  . acetaminophen (TYLENOL) 325 MG tablet Take 650 mg by mouth every 6 (six) hours as needed for mild pain.    Marland Kitchen alendronate (FOSAMAX) 70 MG tablet Take 70 mg by mouth once a week.   4  . aspirin EC 81 MG tablet Take 81 mg by mouth daily.    . benazepril (LOTENSIN) 10 MG tablet Take 5 mg by mouth daily.     Marland Kitchen escitalopram (LEXAPRO) 5 MG tablet Take 5 mg by mouth daily.    Marland Kitchen ipratropium-albuterol (DUONEB) 0.5-2.5 (3) MG/3ML SOLN Inhale 3 mLs into the lungs daily.   5  . levothyroxine (SYNTHROID, LEVOTHROID) 25 MCG tablet Take 25 mcg by mouth daily before breakfast.    . omeprazole (PRILOSEC) 40 MG capsule Take 40 mg by mouth daily.    . traMADol (ULTRAM) 50 MG tablet Take 50 mg by mouth every 6 (six) hours as needed (for back pain).    Pt is not  taking Ultram at home  Fosamax is on Mondays Duonebs only doing 1x per day    Allergies: Allergies as of 08/13/2014  . (No Known Allergies)   Past Medical History  Diagnosis Date  . Hypertension   . Stroke     with h/o dysphagia post stroke  . Thyroid disease   . Left shoulder pain     DJD  . UTI (lower urinary tract infection)     Enterobacter aerogenes  . COPD (chronic obstructive pulmonary disease)   . GERD (gastroesophageal reflux disease)   . Abnormality of gait   . Hard of hearing   . Anemia   . Depression   . Tobacco abuse   . Wheelchair bound   . Bowel obstruction   . Fracture of coccyx     history   Past Surgical History  Procedure Laterality Date  . Hernia repair    . Hip fracture surgery    . Joint replacement      b/l hips  . Gastrectomy      for ulcer  .  Other surgical history      hysterectomy   . Other surgical history     Family History  Problem Relation Age of Onset  . Heart disease Mother     died age 78 MI  . Hypertension Mother   . Alcohol abuse Father   . Cancer      sister, 1 brother (lung) 1 brother (colon), daughter x 2 deceased 1 died age 78 pancreatitic another died age 78 lung  . Diabetes Son   . COPD Son    History   Social History  . Marital Status: Widowed    Spouse Name: N/A    Number of Children: N/A  . Years of Education: N/A   Occupational History  . Not on file.   Social History Main Topics  . Smoking status: Current Some Day Smoker  . Smokeless tobacco: Not on file  . Alcohol Use: Not on file  . Drug Use: Not on file  . Sexual Activity: Not on file   Other Topics Concern  . Not on file   Social History Narrative   [place]   Smoking since age 80 3 packs will last 3-4 days, trying to cut back and quit   1 living daughter, 2 daughters deceased    3 living sons    Unable to do ADLs daughter Darel HongJudy is helping with these at home 6 days per week from 7:30 a to 5:30/6 p       Review of Systems: General: denies fever/chills/appetite changes/sweating HEENT: +hard of hearing  Cardiac: +chest pain improved. Initially chest pain "hurt" this am was 10/10 now 5/10. CP was constant w/o radiation. Nothing made the chest pain worse or better.   Pulm: +chronic cough with white mucous intermittently but cough worse the last couple of days, denies sob now but was sob with chest pain earlier Abd/GU: denies nausea or vomiting or diarrhea, +right flank pain from fall a week ago, denies abdominal pain, denies dysuria, + urinary incontinence (wears depends) and increased frequency and urine odor Ext: denies calf pain b/l Neuro: +falls a few days ago when wheelchair flipped over while pt getting up from the toilet, +resolved dizziness, denies syncope, +right leg weakness (chronic and worsening) MSK:  +mid back pain resolved was 3/10 now 0/10 (relieved in the past with Tylenol nothing makes worse but been present for years), +right flank pain with  coughing and deep breathing (nothing makes better) Skin: paleness noted with chest pain this am Other:+wheelchair bound  Physical Exam: Vital signs on exam 88, 91% on room air, 16-22-->24, 113/77 (81) Blood pressure 121/90, pulse 88, temperature 98.3 F (36.8 C), temperature source Oral, resp. rate 18, SpO2 100 %. Vitals reviewed. General: resting in bed, NAD, frail appearing HEENT: PERRL b/l, [place]/at, no scleral icterus Cardiac: RRR, no rubs, murmurs or gallops, no chest wall ttp Pulm: clear to auscultation bilaterally, no wheezes, rales, or rhonchi Abd: soft, mildly tender to palpation right flank, nondistended, BS present Ext: warm and well perfused, no pedal edema MSK: no ttp in mid back Neuro: alert and oriented X3, cranial nerves II-XII grossly intact, moving all 4 extremities though pt is physically deconditioned.     Lab results: Basic Metabolic Panel:  Recent Labs  40/98/1111/04/21 0806  NA 141  K 4.6  CL 105  CO2 26  GLUCOSE 91  BUN 32*  CREATININE 0.65  CALCIUM 10.4   Liver Function Tests:  Recent Labs  08/13/14 0806  AST 15  ALT 20  ALKPHOS 104  BILITOT 0.3  PROT 7.1  ALBUMIN 3.7   CBC:  Recent Labs  08/13/14 0806  WBC 8.0  HGB 11.8*  HCT 39.7  MCV 84.6  PLT 238   Cardiac Enzymes:  Recent Labs  08/13/14 0915  TROPONINI <0.30   BNP:  Recent Labs  08/13/14 0806  PROBNP 230.3   D-Dimer: No results for input(s): DDIMER in the last 72 hours.  Hemoglobin A1C: No results for input(s): HGBA1C in the last 72 hours. Fasting Lipid Panel: No results for input(s): CHOL, HDL, LDLCALC, TRIG, CHOLHDL, LDLDIRECT in the last 72 hours. Thyroid Function Tests: No results for input(s): TSH, T4TOTAL, FREET4, T3FREE, THYROIDAB in the last 72 hours.  Urine Drug Screen: Drugs of Abuse  No results found for:  LABOPIA, COCAINSCRNUR, LABBENZ, AMPHETMU, THCU, LABBARB   Misc. Labs: UDS Trop x 2  Lipid HA1C  TSH D dimer UA  Imaging results:  Dg Chest 2 View  08/13/2014   CLINICAL DATA:  Chest pain  EXAM: CHEST  2 VIEW  COMPARISON:  04/19/2011  FINDINGS: Atelectasis versus airspace disease at the left base. Upper normal heart size. No pneumothorax. No pleural effusion. Osteopenia. Kyphosis of the upper thoracic spine is stable. No new compression deformities.  IMPRESSION: Atelectasis versus airspace disease at the left base.   Electronically Signed   By: Maryclare BeanArt  Hoss M.D.   On: 08/13/2014 08:39    Other results: EKG: NSR, rate 77, nl axis, nl intervals, no acute ST changes, no LVH, artifact, pending repeat EKG  Assessment & Plan by Problem: 80 y.o presented with chest pain, right flank pain and back pain.   #Chest pain -Unclear etiology at this time.  Will trend troponin x 3 to r/o ACS (negative x 1). Timi score 2 (for aspirin use and age).  BP in both arms almost equal 127/67 and 125/64 per RN report in the ED and no mediastinal widening on CXR less likely aortic dissection. Will check D dimer to r/o thromboembolism (at 3:15 pm). Wells score low risk and Geneva moderate risk PE.  CP does not seem MSK as not reproducible on exam.  Pt denies heartburn at the time of chest pain.   -admit to tele-obs -check UDS, lipid, HA1C, pending repeat EKG a lot of artifact in initial EKG, f/u d dimer if + get further CTA chest  -prn O2 if needed  -  Given Aspirin 324 in the ED. Continue Aspirin 81 mg qd in am  #Right flank pain  -with recent fall ? Rib fracture -will do rib xray right -prn Tylenol, incentive spirometry  #mid back pain -kyhphosis may be etiology no evidence of compression fracture on CXR -prn Tylenol 650 mg q6 prn  #dizziness  -mentioned initially in the ED.  Pt states the room was spinning during last fall so may be BPPV but will check orthostatics as well  #COPD (chronic obstructive  pulmonary disease) -stable. No acute exacerbation.   -no pfts on file per chart review has COPD -Duonebs q 6 prn, pt takes daily at home, given Mucinex DM for cough  #Abnormality of gait/physical deconditioning  -PT/OT prior to discharge for falls. Pt prefers to stay at home with care of daughter 6 days of week for 9 hours.    #Hypertension, uncontrolled.  -BP as high as 140/111.  Pt had not taken BP medication this am -will resume home Lotensin 5 mg qd, verify dose per pharm  #GERD (gastroesophageal reflux disease) -on Prilosec 40 mg at home given Protonix 40 mg qd now to continue   #Normocytic Anemia -at baseline  #History of stroke -continue Aspirin 81   #Thyroid disease -will check tsh continue synthryoid 25 mcg verify correct dose   #History of osteopenia with fractures likely osteoporosis  -Pt takes Fosamax on Mondays  -DEXA 07/2014 per care everywhere but unable to see result -will check Vitamin D level in am  #Depression -continued Lexapro 5 mg, verify dose  #Tobacco abuse -counseling cessation -Nicotine 21 mcg patch  #Chronic constipation -Miralax qd prn  #history of dysphagia  -previously barium swallow indicated aspiration in 07/2013 and rec SLP at that time.  ? If needs speech now with h/o aspiration -pt currently does not have special diet though does have trouble swallowing food (i.e meats)  #DVT px -Lov, scds   #F/E/N -no IVF -BMET in am -HH diet   Dispo: Disposition is deferred at this time, awaiting improvement of current medical problems. Anticipated discharge in approximately 1-2 day(s).   The patient does have a current PCP [facility]-Dr. Hamrick) and does not need an [facility] hospital follow-up appointment after discharge.  The patient does not have transportation limitations that hinder transportation to clinic appointments.  Signed: Annett Gula, MD [phone] 08/13/2014, 11:47 AM

## 2014-08-13 NOTE — ED Notes (Signed)
TSH cannot be added on. Gold top needs to be drawn.

## 2014-08-13 NOTE — Progress Notes (Signed)
Order received to perform RN stroke swallow screen. Unable to perform due to patient's history of dysphagia. MD notified. NPO order placed pending speech eval; SLP evaluation already ordered.

## 2014-08-14 ENCOUNTER — Observation Stay (HOSPITAL_COMMUNITY): Payer: Medicare Other

## 2014-08-14 DIAGNOSIS — J441 Chronic obstructive pulmonary disease with (acute) exacerbation: Secondary | ICD-10-CM | POA: Diagnosis not present

## 2014-08-14 DIAGNOSIS — M8580 Other specified disorders of bone density and structure, unspecified site: Secondary | ICD-10-CM | POA: Diagnosis present

## 2014-08-14 DIAGNOSIS — I10 Essential (primary) hypertension: Secondary | ICD-10-CM | POA: Diagnosis not present

## 2014-08-14 DIAGNOSIS — S2249XA Multiple fractures of ribs, unspecified side, initial encounter for closed fracture: Secondary | ICD-10-CM | POA: Diagnosis present

## 2014-08-14 DIAGNOSIS — R072 Precordial pain: Secondary | ICD-10-CM | POA: Diagnosis not present

## 2014-08-14 DIAGNOSIS — R1310 Dysphagia, unspecified: Secondary | ICD-10-CM

## 2014-08-14 DIAGNOSIS — R0789 Other chest pain: Secondary | ICD-10-CM

## 2014-08-14 DIAGNOSIS — K219 Gastro-esophageal reflux disease without esophagitis: Secondary | ICD-10-CM | POA: Diagnosis not present

## 2014-08-14 DIAGNOSIS — M810 Age-related osteoporosis without current pathological fracture: Secondary | ICD-10-CM

## 2014-08-14 DIAGNOSIS — S2241XA Multiple fractures of ribs, right side, initial encounter for closed fracture: Secondary | ICD-10-CM

## 2014-08-14 LAB — BASIC METABOLIC PANEL
Anion gap: 10 (ref 5–15)
BUN: 23 mg/dL (ref 6–23)
CALCIUM: 9.6 mg/dL (ref 8.4–10.5)
CO2: 26 meq/L (ref 19–32)
Chloride: 103 mEq/L (ref 96–112)
Creatinine, Ser: 0.67 mg/dL (ref 0.50–1.10)
GFR calc Af Amer: >90 mL/min (ref >90)
GFR, EST NON AFRICAN AMERICAN: 79 mL/min — AB (ref >90)
Glucose, Bld: 83 mg/dL (ref 70–99)
Potassium: 3.9 mEq/L (ref 3.7–5.3)
Sodium: 139 mEq/L (ref 137–147)

## 2014-08-14 LAB — URINALYSIS, ROUTINE W REFLEX MICROSCOPIC
Bilirubin Urine: NEGATIVE
GLUCOSE, UA: NEGATIVE mg/dL
HGB URINE DIPSTICK: NEGATIVE
Ketones, ur: NEGATIVE mg/dL
Leukocytes, UA: NEGATIVE
Nitrite: NEGATIVE
Protein, ur: NEGATIVE mg/dL
Urobilinogen, UA: 1 mg/dL (ref 0.0–1.0)
pH: 6.5 (ref 5.0–8.0)

## 2014-08-14 LAB — VITAMIN D 25 HYDROXY (VIT D DEFICIENCY, FRACTURES): Vit D, 25-Hydroxy: 16 ng/mL — ABNORMAL LOW (ref 30–100)

## 2014-08-14 MED ORDER — CALCIUM CARBONATE 1250 (500 CA) MG PO TABS: 1.0000 | ORAL_TABLET | Freq: Two times a day (BID) | ORAL | Status: AC

## 2014-08-14 MED ORDER — VITAMIN D (ERGOCALCIFEROL) 1.25 MG (50000 UNIT) PO CAPS: 50000.0000 [IU] | ORAL_CAPSULE | ORAL | Status: DC

## 2014-08-14 MED ORDER — CALCIUM CARBONATE ANTACID 420 MG PO CHEW: 420.0000 mg | CHEWABLE_TABLET | Freq: Two times a day (BID) | ORAL | Status: DC

## 2014-08-14 MED ORDER — VITAMIN D (ERGOCALCIFEROL) 1.25 MG (50000 UNIT) PO CAPS
50000.0000 [IU] | ORAL_CAPSULE | ORAL | Status: DC
Start: 2014-08-14 — End: 2014-08-15
  Administered 2014-08-14: 50000 [IU] via ORAL
  Filled 2014-08-14: qty 1

## 2014-08-14 MED ORDER — CALCIUM CARBONATE 1250 (500 CA) MG PO TABS
1.0000 | ORAL_TABLET | Freq: Two times a day (BID) | ORAL | Status: DC
  Administered 2014-08-14 – 2014-08-15 (×2): 500 mg via ORAL
  Filled 2014-08-14 (×2): qty 1

## 2014-08-14 MED ORDER — NICOTINE 21 MG/24HR TD PT24: 21.0000 mg | MEDICATED_PATCH | Freq: Every day | TRANSDERMAL | Status: DC

## 2014-08-14 NOTE — Discharge Summary (Signed)
Name: April Mccoy MRN: [id number] DOB: 09/06/1929 80 y.o. PCP: Ailene RavelMaura L Hamrick, MD ______________________________________________________________  Date of Admission: 08/13/2014  7:36 AM Date of Discharge: 08/14/2014 Attending Physician: Burns SpainElizabeth A Butcher, MD   Discharge Diagnosis: Patient Active Problem List   Diagnosis Date Noted  . Fracture of multiple ribs 08/14/2014  . Osteopenia 08/14/2014  . Chest pain 08/13/2014  . GERD (gastroesophageal reflux disease) 08/13/2014  . Anemia 08/13/2014  . Depression 08/13/2014  . History of stroke 08/13/2014  . Hypertension 08/13/2014  . Thyroid disease 08/13/2014  . COPD (chronic obstructive pulmonary disease) 08/13/2014  . Dysphagia 08/13/2014  . Right flank pain 08/13/2014  . Mid back pain 08/13/2014  . Abnormality of gait 08/13/2014  . Tobacco abuse 08/13/2014     Discharge Medications:   Medication List    STOP taking these medications        nystatin powder  Commonly known as:  MYCOSTATIN     polyethylene glycol packet  Commonly known as:  MIRALAX / GLYCOLAX      TAKE these medications        acetaminophen 325 MG tablet  Commonly known as:  TYLENOL  Take 650 mg by mouth every 6 (six) hours as needed for mild pain.     alendronate 70 MG tablet  Commonly known as:  FOSAMAX  Take 70 mg by mouth once a week.     aspirin EC 81 MG tablet  Take 81 mg by mouth daily.     benazepril 10 MG tablet  Commonly known as:  LOTENSIN  Take 5 mg by mouth daily.     calcium carbonate 1250 MG tablet  Commonly known as:  OS-CAL - dosed in mg of elemental calcium  Take 1 tablet (500 mg of elemental calcium total) by mouth 2 (two) times daily with a meal.     escitalopram 5 MG tablet  Commonly known as:  LEXAPRO  Take 5 mg by mouth daily.     ipratropium-albuterol 0.5-2.5 (3) MG/3ML Soln  Commonly known as:  DUONEB  Inhale 3 mLs into the lungs daily.     levothyroxine 25 MCG tablet  Commonly known as:  SYNTHROID,  LEVOTHROID  Take 25 mcg by mouth daily before breakfast.     nicotine 21 mg/24hr patch  Commonly known as:  NICODERM CQ - dosed in mg/24 hours  Place 1 patch (21 mg total) onto the skin daily.     omeprazole 40 MG capsule  Commonly known as:  PRILOSEC  Take 40 mg by mouth daily.     traMADol 50 MG tablet  Commonly known as:  ULTRAM  Take 50 mg by mouth every 6 (six) hours as needed (for back pain).     Vitamin D (Ergocalciferol) 50000 UNITS Caps capsule  Commonly known as:  DRISDOL  Take 1 capsule (50,000 Units total) by mouth every 7 (seven) days.        Disposition and follow-up:   Ms.April Judie PetitM Hollice Esp[facility]Gibson [facility].  Please address the following problems post-discharge:  1. Please consider parathyroid treatment for her severe osteoporesis. Please consider GI follow up for her dysphagia.  Added vitamin D 50,000 weekly and calcium.   Has 9 mm pseudoaneurysm seen on CTA chest. This may need to be followed up.  Discharging with PT/OT and SLP at home.  Discussed with daughter and patient about DNR option. Form was given to them for discussion.  Labs / imaging needed at time of follow-up:   Pending labs/ test needing follow-up:   Follow-up Appointments: Follow-up Information    Follow up with [facility], MD On 08/23/2014.   Specialty:  Family Medicine   Why:  2:15PM   Contact information:   Dr. [name] [address] [place] [address] [phone]       Discharge Instructions: Discharge Instructions    Call MD for:  difficulty breathing, headache or visual disturbances    Complete by:  As directed      Call MD for:  persistant dizziness or light-headedness    Complete by:  As directed      Call MD for:  severe uncontrolled pain    Complete by:  As directed      Diet - low sodium heart healthy    Complete by:  As directed      Discharge instructions    Complete by:  As  directed   Please keep your follow-up appointments.     Increase activity slowly    Complete by:  As directed            Consultations:    Procedures Performed:  Dg Chest 2 View  08/13/2014   CLINICAL DATA:  Chest pain  EXAM: CHEST  2 VIEW  COMPARISON:  04/19/2011  FINDINGS: Atelectasis versus airspace disease at the left base. Upper normal heart size. No pneumothorax. No pleural effusion. Osteopenia. Kyphosis of the upper thoracic spine is stable. No new compression deformities.  IMPRESSION: Atelectasis versus airspace disease at the left base.   Electronically Signed   By: Maryclare Bean M.D.   On: 08/13/2014 08:39   Dg Ribs Unilateral Right  08/13/2014   CLINICAL DATA:  Larey Seat in bathroom yesterday.  Rib pain  EXAM: RIGHT RIBS - 2 VIEW  COMPARISON:  Chest x-ray today  FINDINGS: Fractures of 2 right lower lateral ribs with minimal displacement. Generalized osteopenia. Right lung is clear without effusion.  IMPRESSION: Fracture of 2 right lower lateral ribs.   Electronically Signed   By: Marlan Palau M.D.   On: 08/13/2014 13:36   Ct Angio Chest Pe W/cm &/or Wo Cm  08/13/2014   CLINICAL DATA:  Elevated D-dimer, chest pain  EXAM: CT ANGIOGRAPHY CHEST WITH CONTRAST  TECHNIQUE: Multidetector CT imaging of the chest was performed using the standard protocol during bolus administration of intravenous contrast. Multiplanar CT image reconstructions and MIPs were obtained to evaluate the vascular anatomy.  CONTRAST:  OMNIPAQUE IOHEXOL 350 MG/ML SOLN  COMPARISON:  None.  FINDINGS: There is adequate opacification of the pulmonary arteries. There is no pulmonary embolus. The main pulmonary artery, right main pulmonary artery and left main pulmonary arteries are normal in size. The heart size is normal. There is no pericardial effusion. There is a small, 9 mm, pseudoaneurysm arising from the distal aortic arch along the anterolateral aspect.  There is a small right pleural effusion. There is no focal  consolidation or pneumothorax. There is bibasilar atelectasis. There is mild centrilobular emphysema.  There is no axillary, hilar, or mediastinal adenopathy.  There is no lytic or blastic osseous lesion. Chronic T9 and T11 compression fractures.  The visualized portions of the upper abdomen are unremarkable.  Review of the MIP images confirms the above findings.  IMPRESSION: 1. No evidence of pulmonary embolus. 2. Small right pleural effusion. 3. Small 9 mm pseudoaneurysm arising from the distal intra lateral aortic arch.   Electronically Signed  By: Elige Ko   On: 08/13/2014 20:59   Dg Swallowing Func-speech Pathology  08/14/2014   Mammie Lorenzo, CCC-SLP     08/14/2014 12:11 PM Objective Swallowing Evaluation: Bedside swallow evaluation  Patient Details  Name: CHANICE BRENTON MRN: [id number] Date of Birth: 25-Feb-1929  Today's Date: 08/14/2014 Time: 7829-5621 SLP Time Calculation (min) (ACUTE ONLY): 14 min  Past Medical History:  Past Medical History  Diagnosis Date  . Hypertension   . Stroke     with h/o dysphagia post stroke  . Thyroid disease   . Left shoulder pain     DJD  . UTI (lower urinary tract infection)     Enterobacter aerogenes  . COPD (chronic obstructive pulmonary disease)   . GERD (gastroesophageal reflux disease)   . Abnormality of gait   . Hard of hearing   . Anemia   . Depression   . Tobacco abuse   . Wheelchair bound   . Bowel obstruction   . Fracture of coccyx     history  . Shortness of breath dyspnea    Past Surgical History:  Past Surgical History  Procedure Laterality Date  . Hernia repair    . Hip fracture surgery    . Joint replacement      b/l hips  . Gastrectomy      for ulcer  . Other surgical history      hysterectomy   . Other surgical history    . Cataract extraction    . Abdominal hysterectomy     HPI:  80 y.o  female admitted 08/13/14 with PMH stroke, HTN, tobacco  abuse, GERD, depression anemia, dysphagia, osteopenia with  fractures (osteoporosis), falls.  She presented  after waking up  with mild chest pain.She had shortness of breath initially with  chest pain that is now improved/resolved.  She denies  nausea/vomiting or sweating.  She has a chronic cough with white  mucous intermittently but her cough is worse the last couple of  days.  She complains of right flank/upper quadrant pain noticed  since fall within the las week. Pain is worse with coughing and  deep breathing (nothing makes better).  She has a history of  falls with a recent fall a few days ago when her wheelchair  flipped over while she was getting up from the toilet.       Assessment / Plan / Recommendation Clinical Impression  Dysphagia Diagnosis: Moderate pharyngeal phase dysphagia  Clinical impression: Pt has a moderate pharyngeal dysphagia with  suspected esophageal component. Oral phase is functional overall  with quick transit into the pharynx; however, there is a delay in  pharyngeal response that leads to intermittently silent  aspiration with thin and nectar thick liquids. Sensed aspiration  elicits a weak cough response that is not able to clear  aspirates. A chin tuck is effective at containing nectar thick  liquids in the valleculae until swallow response can be  triggered, therefore effectively increasing airway protection.  Solids transit through the pharynx well without airway compromise  or significant residual. There appeared to be barium material  retained in the esophagus (MD not present to confirm), which may  also be contributing to patient's reported symptoms. Recommend  Dys 3 diet and nectar thick liquids via chin tuck. SLP to  continue to follow for tolerance and utilization of compensatory  strategies.     Treatment Recommendation  Therapy as outlined in treatment plan below    Diet Recommendation Dysphagia 3 (  Mechanical Soft);Nectar-thick  liquid   Liquid Administration via: Straw Medication Administration: Whole meds with puree Supervision: Patient able to self feed;Full  supervision/cueing  for compensatory strategies Compensations: Slow rate;Small sips/bites;Follow solids with  liquid Postural Changes and/or Swallow Maneuvers: Seated upright 90  degrees;Upright 30-60 min after meal;Chin tuck    Other  Recommendations Recommended Consults: MBS Oral Care Recommendations: Oral care BID Other Recommendations: Order thickener from pharmacy;Prohibited  food (jello, ice cream, thin soups);Remove water pitcher   Follow Up Recommendations  Home health SLP    Frequency and Duration min 2x/week  2 weeks   Pertinent Vitals/Pain n/a    SLP Swallow Goals     General Date of Onset: 08/13/14 HPI: 80 y.o  female admitted 08/13/14 with PMH stroke, HTN,  tobacco abuse, GERD, depression anemia, dysphagia, osteopenia  with fractures (osteoporosis), falls.  She presented after waking  up with mild chest pain.She had shortness of breath initially  with chest pain that is now improved/resolved.  She denies  nausea/vomiting or sweating.  She has a chronic cough with white  mucous intermittently but her cough is worse the last couple of  days.  She complains of right flank/upper quadrant pain noticed  since fall within the las week. Pain is worse with coughing and  deep breathing (nothing makes better).  She has a history of  falls with a recent fall a few days ago when her wheelchair  flipped over while she was getting up from the toilet.   Type of Study: Bedside swallow evaluation Reason for Referral: Objectively evaluate swallowing function Previous Swallow Assessment: none in chart, however documentation  of need for nectar thick liquids in the past Diet Prior to this Study: NPO Temperature Spikes Noted: Yes (low grade) Respiratory Status: Room air History of Recent Intubation: No Behavior/Cognition: Alert;Cooperative;Pleasant mood;Requires  cueing Oral Cavity - Dentition: Dentures, top Self-Feeding Abilities: Able to feed self Patient Positioning: Upright in chair Baseline Vocal Quality: Clear  Volitional Cough: Weak Volitional Swallow: Able to elicit Anatomy: Within functional limits Pharyngeal Secretions: Not observed secondary MBS    Reason for Referral Objectively evaluate swallowing function   Oral Phase Oral Preparation/Oral Phase Oral Phase: WFL   Pharyngeal Phase Pharyngeal Phase Pharyngeal Phase: Impaired Pharyngeal - Nectar Pharyngeal - Nectar Cup: Delayed swallow initiation;Reduced  anterior laryngeal mobility;Reduced laryngeal  elevation;Penetration/Aspiration during swallow Penetration/Aspiration details (nectar cup): Material enters  airway, passes BELOW cords without attempt by patient to eject  out (silent aspiration) Pharyngeal - Nectar Straw: Delayed swallow initiation;Reduced  anterior laryngeal mobility;Reduced laryngeal  elevation;Compensatory strategies attempted (Comment) (chin tuck  effective at increasing airway protection) Penetration/Aspiration details (nectar straw): Material does not  enter airway Pharyngeal - Thin Pharyngeal - Thin Teaspoon: Delayed swallow initiation;Reduced  anterior laryngeal mobility;Reduced laryngeal  elevation;Penetration/Aspiration before swallow Penetration/Aspiration details (thin teaspoon): Material enters  airway, passes BELOW cords without attempt by patient to eject  out (silent aspiration) Pharyngeal - Thin Cup: Delayed swallow initiation;Reduced  anterior laryngeal mobility;Reduced laryngeal  elevation;Penetration/Aspiration before swallow;Compensatory  strategies attempted (Comment) (chin tuck ineffective) Penetration/Aspiration details (thin cup): Material enters  airway, passes BELOW cords and not ejected out despite cough  attempt by patient Pharyngeal - Solids Pharyngeal - Puree: Delayed swallow initiation;Reduced anterior  laryngeal mobility;Reduced laryngeal elevation Penetration/Aspiration details (puree): Material does not enter  airway Pharyngeal - Mechanical Soft: Delayed swallow initiation;Reduced  anterior laryngeal mobility;Reduced  laryngeal elevation Penetration/Aspiration details (mechanical soft): Material does  not enter airway  Cervical Esophageal Phase    GO  Cervical Esophageal Phase Cervical Esophageal Phase: Impaired Cervical Esophageal Phase - Comment Cervical Esophageal Comment: barium material appeared to remain  in esophagus    Functional Assessment Tool Used: skilled clinical judgment Functional Limitations: Swallowing Swallow Current Status ([id number]): At least 40 percent but less than  60 percent impaired, limited or restricted Swallow Goal Status [phone]): At least 20 percent but less than 40  percent impaired, limited or restricted     Maxcine Ham, M.A. CCC-SLP [phone]  Maxcine Ham 08/14/2014, 12:10 PM     2D Echo: N/A  Cardiac Cath: N/A  Admission HPI:  80 y.o PMH stroke, HTN, tobacco abuse, GERD, depression anemia, dysphagia, osteopenia with fractures (osteoporosis), falls. She presented after waking up with mid chest pain which is now improve but initially "hurt" this am was 10/10 now 5/10. Chest pain was constant w/o radiation. Nothing made the chest pain worse or better. She had shortness of breath initially with chest pain that is now improved/resolved. She denies nausea/vomiting or sweating. Daughter noted paleness to skin associated with episode of chest pain this am. Ms. Netzer was scared this am and told her daughter she wanted to come to the hospital which she never wants to come in to the hospital so her daughter was concerned as well.   She also reports other symptoms. She has a chronic cough with white mucous intermittently but her cough is worse the last couple of days. She complains of right flank/upper quadrant pain noticed since fall within the las week. Pain is worse with with coughing and deep breathing (nothing makes better). She has a history of falls with a recent fall a few days ago when her wheelchair flipped over while she was getting up from the toilet. Patient also  complains of mid back pain resolved now was 3/10 now 0/10. She has had this back pain previously relieved in the past with Tylenol and nothing makes worse but it has been present for years. ED RN checked BP in both arms and it was 127/67 and 125/64 per report.   Annett Gula, MD  Hospital Course by problem list: Principal Problem:   Fracture of multiple ribs Active Problems:   Chest pain   GERD (gastroesophageal reflux disease)   Anemia   Depression   History of stroke   Hypertension   Thyroid disease   COPD (chronic obstructive pulmonary disease)   Dysphagia   Right flank pain   Mid back pain   Abnormality of gait   Tobacco abuse   Osteopenia  80 yo female with HTN, Stroke, COPD, GERD, gait abnormality and recurrent falls comes in with chest pain found to have right ribs fractures.   Chest pain/rib pain - 2/2 to rib fracture . had a fall. Had chest pain s/p fall. Ruled out MI (trops negative, no ekg changes), D-dimer elevated, CT angio negative for PE. Does show small right pleural effusion, 9mm pseudoaneurysm arising from distal lateral aortic arch. No esophageal rupture seen. - Xray rib shows fracture of 2 right lower lateral ribs. pain remained well controlled on tylenol.  - gave incenstive spirometer to avoid atelectasis in the setting of rib fracture. - should work with PT/OT at home.  Dysphagia - has dysphagia chronically due to previous CVA. Usually she coughs when eats. Daughter was surprised that she wasn't coughing during SLP eval. Suspected silent aspiration. Did barium swallow test. Shows normal oral phase, but has delayed pharyngeal response that leads to intermittent silent aspiration with thin and nectar thick  liquids. Has weak cough responsive not able to clear aspirates. Some barium retained in the esophagus.  - Dysphagia diet 3 now and chin tuck recommended. Appreciate SLP recs. - likely needs outpatient GI workup. Will leave upto PCP.  Osteoporesis - with  had dexa at PCP office 3 weeks ago per daughter which showed severe osteoporesis. Was put on fosamax weekly for her osteopenia previously with fracture.  - continue fosamax outpatient. Likely needs PTH therapy since osteoporesis is progressing despite being on fosamax. This should be decided outpatient with PCP - vit D 16. Started vit d 50,000 weekly + calcium carbonate 500mg  BID - advised smoking cessation and sent nicotine patches.  Mid back pain - chronic. No compression fracture visible on CXR - improving. Continue tylenol.   COPD - stable.  - duonebs q6hr prn, mucinex for cough.  GERD - prilosec at home. protonix here. Hx of Stroke - on asa 34. Continue Thyroid disease: on synthyroid . TSH 1.99. Continue synthyroid.    Discharge Vitals:   BP 150/76 mmHg  Pulse 79  Temp(Src) 98 F (36.7 C) (Oral)  Resp 18  Ht 5\' 5"  (1.651 m)  Wt 102 lb 9.6 oz (46.539 kg)  BMI 17.07 kg/m2  SpO2 94%  Discharge Labs:  Results for orders placed or performed during the hospital encounter of 08/13/14 (from the past 24 hour(s))  Basic metabolic panel     Status: Abnormal   Collection Time: 08/14/14  2:57 AM  Result Value Ref Range   Sodium 139 137 - 147 mEq/L   Potassium 3.9 3.7 - 5.3 mEq/L   Chloride 103 96 - 112 mEq/L   CO2 26 19 - 32 mEq/L   Glucose, Bld 83 70 - 99 mg/dL   BUN 23 6 - 23 mg/dL   Creatinine, Ser 1.61 0.50 - 1.10 mg/dL   Calcium 9.6 8.4 - 09.6 mg/dL   GFR calc non Af Amer 79 (L) >90 mL/min   GFR calc Af Amer >90 >90 mL/min   Anion gap 10 5 - 15  Vit D  25 hydroxy (rtn osteoporosis monitoring)     Status: Abnormal   Collection Time: 08/14/14  2:57 AM  Result Value Ref Range   Vit D, 25-Hydroxy 16 (L) 30 - 100 ng/mL  Urinalysis, Routine w reflex microscopic     Status: Abnormal   Collection Time: 08/14/14  3:11 AM  Result Value Ref Range   Color, Urine YELLOW YELLOW   APPearance CLEAR CLEAR   Specific Gravity, Urine <1.005 (L) 1.005 - 1.030   pH 6.5 5.0 - 8.0     Glucose, UA NEGATIVE NEGATIVE mg/dL   Hgb urine dipstick NEGATIVE NEGATIVE   Bilirubin Urine NEGATIVE NEGATIVE   Ketones, ur NEGATIVE NEGATIVE mg/dL   Protein, ur NEGATIVE NEGATIVE mg/dL   Urobilinogen, UA 1.0 0.0 - 1.0 mg/dL   Nitrite NEGATIVE NEGATIVE   Leukocytes, UA NEGATIVE NEGATIVE    Signed: Marrian Salvage, MD 08/14/2014, 9:12 PM   Services Ordered on Discharge:PT/OT/Speech  Equipment Ordered on Discharge: None

## 2014-08-14 NOTE — Progress Notes (Signed)
Subjective:  Patient doing well today. Pain is very mild today on her right rib area. Has some with coughing. Talked with daughter in the room. Has hx of falls, lives at elderly community where is alone most of the time. Had DEXA 3 weeks ago, per daughter, it showed severe osteoporesis. She has been on fosamax. She has been wheel chair bound because of her hx of falls.   Rib xray shows right sided rib fracture.  Breathing fine. Denies any sob, n/v, fever, chills, headache, numbness, weakness, tingling.  Objective: Vital signs in last 24 hours: Filed Vitals:   08/13/14 1215 08/13/14 2103 08/14/14 0545 08/14/14 1340  BP: 121/61 125/74 119/62 116/69  Pulse: 81 84 81 79  Temp: 98.9 F (37.2 C) 99.3 F (37.4 C) 98.1 F (36.7 C) 98.5 F (36.9 C)  TempSrc: Oral Oral Oral Oral  Resp: 18 18 18 18   Height: 5\' 5"  (1.651 m)     Weight: 216 lb 14.9 oz (98.4 kg)  102 lb 9.6 oz (46.539 kg)   SpO2: 93% 94% 92% 91%   Weight change:   Intake/Output Summary (Last 24 hours) at 08/14/14 1402 Last data filed at 08/13/14 2200  Gross per 24 hour  Intake      3 ml  Output      0 ml  Net      3 ml   Vitals reviewed. General: resting in bed, NAD HEENT: PERRL, EOMI, no scleral icterus Cardiac: RRR, no rubs, murmurs or gallops Pulm: clear to auscultation bilaterally, no wheezes, rales, or rhonchi. Chest wall and rib non tender. Abd: soft, nontender, nondistended, BS present Ext: warm and well perfused, no pedal edema Neuro: alert and oriented X3, cranial nerves II-XII grossly intact, strength and sensation to light touch equal in bilateral upper and lower extremities  Lab Results: Basic Metabolic Panel:  Recent Labs Lab 08/13/14 0806 08/14/14 0257  NA 141 139  K 4.6 3.9  CL 105 103  CO2 26 26  GLUCOSE 91 83  BUN 32* 23  CREATININE 0.65 0.67  CALCIUM 10.4 9.6   Liver Function Tests:  Recent Labs Lab 08/13/14 0806  AST 15  ALT 20  ALKPHOS 104  BILITOT 0.3  PROT 7.1  ALBUMIN  3.7   No results for input(s): LIPASE, AMYLASE in the last 168 hours. No results for input(s): AMMONIA in the last 168 hours. CBC:  Recent Labs Lab 08/13/14 0806  WBC 8.0  HGB 11.8*  HCT 39.7  MCV 84.6  PLT 238   Cardiac Enzymes:  Recent Labs Lab 08/13/14 0915 08/13/14 1500 08/13/14 2104  TROPONINI <0.30 <0.30 <0.30   BNP:  Recent Labs Lab 08/13/14 0806  PROBNP 230.3   D-Dimer:  Recent Labs Lab 08/13/14 1500  DDIMER 1.91*   CBG: No results for input(s): GLUCAP in the last 168 hours. Hemoglobin A1C:  Recent Labs Lab 08/13/14 1500  HGBA1C 5.9*   Fasting Lipid Panel:  Recent Labs Lab 08/13/14 1500  CHOL 192  HDL 41  LDLCALC 124*  TRIG 133  CHOLHDL 4.7   Thyroid Function Tests:  Recent Labs Lab 08/13/14 0815  TSH 1.990   Coagulation: No results for input(s): LABPROT, INR in the last 168 hours. Anemia Panel: No results for input(s): VITAMINB12, FOLATE, FERRITIN, TIBC, IRON, RETICCTPCT in the last 168 hours. Urine Drug Screen: Drugs of Abuse     Component Value Date/Time   LABOPIA NONE DETECTED 08/13/2014 1105   COCAINSCRNUR NONE DETECTED 08/13/2014 1105  LABBENZ NONE DETECTED 08/13/2014 1105   AMPHETMU NONE DETECTED 08/13/2014 1105   THCU NONE DETECTED 08/13/2014 1105   LABBARB NONE DETECTED 08/13/2014 1105    Alcohol Level: No results for input(s): ETH in the last 168 hours. Urinalysis:  Recent Labs Lab 08/14/14 0311  COLORURINE YELLOW  LABSPEC <1.005*  PHURINE 6.5  GLUCOSEU NEGATIVE  HGBUR NEGATIVE  BILIRUBINUR NEGATIVE  KETONESUR NEGATIVE  PROTEINUR NEGATIVE  UROBILINOGEN 1.0  NITRITE NEGATIVE  LEUKOCYTESUR NEGATIVE   Misc. Labs:  Micro Results: No results found for this or any previous visit (from the past 240 hour(s)). Studies/Results: Dg Chest 2 View  08/13/2014   CLINICAL DATA:  Chest pain  EXAM: CHEST  2 VIEW  COMPARISON:  04/19/2011  FINDINGS: Atelectasis versus airspace disease at the left base. Upper  normal heart size. No pneumothorax. No pleural effusion. Osteopenia. Kyphosis of the upper thoracic spine is stable. No new compression deformities.  IMPRESSION: Atelectasis versus airspace disease at the left base.   Electronically Signed   By: Maryclare BeanArt  Hoss M.D.   On: 08/13/2014 08:39   Dg Ribs Unilateral Right  08/13/2014   CLINICAL DATA:  Larey SeatFell in bathroom yesterday.  Rib pain  EXAM: RIGHT RIBS - 2 VIEW  COMPARISON:  Chest x-ray today  FINDINGS: Fractures of 2 right lower lateral ribs with minimal displacement. Generalized osteopenia. Right lung is clear without effusion.  IMPRESSION: Fracture of 2 right lower lateral ribs.   Electronically Signed   By: Marlan Palauharles  Clark M.D.   On: 08/13/2014 13:36   Ct Angio Chest Pe W/cm &/or Wo Cm  08/13/2014   CLINICAL DATA:  Elevated D-dimer, chest pain  EXAM: CT ANGIOGRAPHY CHEST WITH CONTRAST  TECHNIQUE: Multidetector CT imaging of the chest was performed using the standard protocol during bolus administration of intravenous contrast. Multiplanar CT image reconstructions and MIPs were obtained to evaluate the vascular anatomy.  CONTRAST:  100mL OMNIPAQUE IOHEXOL 350 MG/ML SOLN  COMPARISON:  None.  FINDINGS: There is adequate opacification of the pulmonary arteries. There is no pulmonary embolus. The main pulmonary artery, right main pulmonary artery and left main pulmonary arteries are normal in size. The heart size is normal. There is no pericardial effusion. There is a small, 9 mm, pseudoaneurysm arising from the distal aortic arch along the anterolateral aspect.  There is a small right pleural effusion. There is no focal consolidation or pneumothorax. There is bibasilar atelectasis. There is mild centrilobular emphysema.  There is no axillary, hilar, or mediastinal adenopathy.  There is no lytic or blastic osseous lesion. Chronic T9 and T11 compression fractures.  The visualized portions of the upper abdomen are unremarkable.  Review of the MIP images confirms the above  findings.  IMPRESSION: 1. No evidence of pulmonary embolus. 2. Small right pleural effusion. 3. Small 9 mm pseudoaneurysm arising from the distal intra lateral aortic arch.   Electronically Signed   By: Elige KoHetal  Patel   On: 08/13/2014 20:59   Dg Swallowing Func-speech Pathology  08/14/2014   Mammie LorenzoLaura A Paiewonsky, CCC-SLP     08/14/2014 12:11 PM Objective Swallowing Evaluation: Bedside swallow evaluation  Patient Details  Name: Henderson NewcomerVallie M Coiner MRN: [id number] Date of Birth: 07/22/1929  Today's Date: 08/14/2014 Time: [id number] SLP Time Calculation (min) (ACUTE ONLY): 14 min  Past Medical History:  Past Medical History  Diagnosis Date  . Hypertension   . Stroke     with h/o dysphagia post stroke  . Thyroid disease   . Left shoulder pain  DJD  . UTI (lower urinary tract infection)     Enterobacter aerogenes  . COPD (chronic obstructive pulmonary disease)   . GERD (gastroesophageal reflux disease)   . Abnormality of gait   . Hard of hearing   . Anemia   . Depression   . Tobacco abuse   . Wheelchair bound   . Bowel obstruction   . Fracture of coccyx     history  . Shortness of breath dyspnea    Past Surgical History:  Past Surgical History  Procedure Laterality Date  . Hernia repair    . Hip fracture surgery    . Joint replacement      b/l hips  . Gastrectomy      for ulcer  . Other surgical history      hysterectomy   . Other surgical history    . Cataract extraction    . Abdominal hysterectomy     HPI:  80 y.o  female admitted 08/13/14 with PMH stroke, HTN, tobacco  abuse, GERD, depression anemia, dysphagia, osteopenia with  fractures (osteoporosis), falls.  She presented after waking up  with mild chest pain.She had shortness of breath initially with  chest pain that is now improved/resolved.  She denies  nausea/vomiting or sweating.  She has a chronic cough with white  mucous intermittently but her cough is worse the last couple of  days.  She complains of right flank/upper quadrant pain noticed  since fall within the las  week. Pain is worse with coughing and  deep breathing (nothing makes better).  She has a history of  falls with a recent fall a few days ago when her wheelchair  flipped over while she was getting up from the toilet.       Assessment / Plan / Recommendation Clinical Impression  Dysphagia Diagnosis: Moderate pharyngeal phase dysphagia  Clinical impression: Pt has a moderate pharyngeal dysphagia with  suspected esophageal component. Oral phase is functional overall  with quick transit into the pharynx; however, there is a delay in  pharyngeal response that leads to intermittently silent  aspiration with thin and nectar thick liquids. Sensed aspiration  elicits a weak cough response that is not able to clear  aspirates. A chin tuck is effective at containing nectar thick  liquids in the valleculae until swallow response can be  triggered, therefore effectively increasing airway protection.  Solids transit through the pharynx well without airway compromise  or significant residual. There appeared to be barium material  retained in the esophagus (MD not present to confirm), which may  also be contributing to patient's reported symptoms. Recommend  Dys 3 diet and nectar thick liquids via chin tuck. SLP to  continue to follow for tolerance and utilization of compensatory  strategies.     Treatment Recommendation  Therapy as outlined in treatment plan below    Diet Recommendation Dysphagia 3 (Mechanical Soft);Nectar-thick  liquid   Liquid Administration via: Straw Medication Administration: Whole meds with puree Supervision: Patient able to self feed;Full supervision/cueing  for compensatory strategies Compensations: Slow rate;Small sips/bites;Follow solids with  liquid Postural Changes and/or Swallow Maneuvers: Seated upright 90  degrees;Upright 30-60 min after meal;Chin tuck    Other  Recommendations Recommended Consults: MBS Oral Care Recommendations: Oral care BID Other Recommendations: Order thickener from  pharmacy;Prohibited  food (jello, ice cream, thin soups);Remove water pitcher   Follow Up Recommendations  Home health SLP    Frequency and Duration min 2x/week  2 weeks   Pertinent Vitals/Pain  n/a    SLP Swallow Goals     General Date of Onset: 08/13/14 HPI: 80 y.o  female admitted 08/13/14 with PMH stroke, HTN,  tobacco abuse, GERD, depression anemia, dysphagia, osteopenia  with fractures (osteoporosis), falls.  She presented after waking  up with mild chest pain.She had shortness of breath initially  with chest pain that is now improved/resolved.  She denies  nausea/vomiting or sweating.  She has a chronic cough with white  mucous intermittently but her cough is worse the last couple of  days.  She complains of right flank/upper quadrant pain noticed  since fall within the las week. Pain is worse with coughing and  deep breathing (nothing makes better).  She has a history of  falls with a recent fall a few days ago when her wheelchair  flipped over while she was getting up from the toilet.   Type of Study: Bedside swallow evaluation Reason for Referral: Objectively evaluate swallowing function Previous Swallow Assessment: none in chart, however documentation  of need for nectar thick liquids in the past Diet Prior to this Study: NPO Temperature Spikes Noted: Yes (low grade) Respiratory Status: Room air History of Recent Intubation: No Behavior/Cognition: Alert;Cooperative;Pleasant mood;Requires  cueing Oral Cavity - Dentition: Dentures, top Self-Feeding Abilities: Able to feed self Patient Positioning: Upright in chair Baseline Vocal Quality: Clear Volitional Cough: Weak Volitional Swallow: Able to elicit Anatomy: Within functional limits Pharyngeal Secretions: Not observed secondary MBS    Reason for Referral Objectively evaluate swallowing function   Oral Phase Oral Preparation/Oral Phase Oral Phase: WFL   Pharyngeal Phase Pharyngeal Phase Pharyngeal Phase: Impaired Pharyngeal - Nectar Pharyngeal - Nectar Cup:  Delayed swallow initiation;Reduced  anterior laryngeal mobility;Reduced laryngeal  elevation;Penetration/Aspiration during swallow Penetration/Aspiration details (nectar cup): Material enters  airway, passes BELOW cords without attempt by patient to eject  out (silent aspiration) Pharyngeal - Nectar Straw: Delayed swallow initiation;Reduced  anterior laryngeal mobility;Reduced laryngeal  elevation;Compensatory strategies attempted (Comment) (chin tuck  effective at increasing airway protection) Penetration/Aspiration details (nectar straw): Material does not  enter airway Pharyngeal - Thin Pharyngeal - Thin Teaspoon: Delayed swallow initiation;Reduced  anterior laryngeal mobility;Reduced laryngeal  elevation;Penetration/Aspiration before swallow Penetration/Aspiration details (thin teaspoon): Material enters  airway, passes BELOW cords without attempt by patient to eject  out (silent aspiration) Pharyngeal - Thin Cup: Delayed swallow initiation;Reduced  anterior laryngeal mobility;Reduced laryngeal  elevation;Penetration/Aspiration before swallow;Compensatory  strategies attempted (Comment) (chin tuck ineffective) Penetration/Aspiration details (thin cup): Material enters  airway, passes BELOW cords and not ejected out despite cough  attempt by patient Pharyngeal - Solids Pharyngeal - Puree: Delayed swallow initiation;Reduced anterior  laryngeal mobility;Reduced laryngeal elevation Penetration/Aspiration details (puree): Material does not enter  airway Pharyngeal - Mechanical Soft: Delayed swallow initiation;Reduced  anterior laryngeal mobility;Reduced laryngeal elevation Penetration/Aspiration details (mechanical soft): Material does  not enter airway  Cervical Esophageal Phase    GO    Cervical Esophageal Phase Cervical Esophageal Phase: Impaired Cervical Esophageal Phase - Comment Cervical Esophageal Comment: barium material appeared to remain  in esophagus    Functional Assessment Tool Used: skilled clinical  judgment Functional Limitations: Swallowing Swallow Current Status (Z6109): At least 40 percent but less than  60 percent impaired, limited or restricted Swallow Goal Status [phone]): At least 20 percent but less than 40  percent impaired, limited or restricted     Maxcine Ham, M.A. CCC-SLP [phone]  Maxcine Ham 08/14/2014, 12:10 PM    Medications: I have reviewed the patient's current medications. Scheduled Meds: . aspirin EC  81 mg Oral Daily  . benazepril  5 mg Oral Daily  . dextromethorphan-guaiFENesin  1 tablet Oral BID  . enoxaparin (LOVENOX) injection  40 mg Subcutaneous Q24H  . escitalopram  5 mg Oral Daily  . feeding supplement (ENSURE COMPLETE)  237 mL Oral BID BM  . levothyroxine  25 mcg Oral QAC breakfast  . nicotine  21 mg Transdermal Daily  . nystatin   Topical TID  . pantoprazole  40 mg Oral Daily  . sodium chloride  3 mL Intravenous Q12H   Continuous Infusions:  PRN Meds:.acetaminophen **OR** acetaminophen, ipratropium-albuterol, polyethylene glycol Assessment/Plan: Principal Problem:   Chest pain Active Problems:   GERD (gastroesophageal reflux disease)   Anemia   Depression   History of stroke   Hypertension   Thyroid disease   COPD (chronic obstructive pulmonary disease)   Dysphagia   Right flank pain   Mid back pain   Abnormality of gait   Tobacco abuse  80 yo female with HTN, Stroke, COPD, GERD, gait abnormality and recurrent falls comes in with chest pain found to have right ribs fractures.   Chest pain/rib pain - 2/2 to rib fracture . had a fall. Cp s/p fall. Ruled out MI (trops negative, no ekg changes), D-dimer elevated, CT angio negative for PE. Does show small right pleural effusion, 9mm pseudoaneurysm arising from distal lateral aortic arch. No esophageal rupture seen. - Xray rib shows fracture of 2 right lower lateral ribs.  - pain well controlled on tylenol. Continue this. - SSI to avoid atelectasis in the setting of rib  fracture.  Dysphagia - has dysphagia chronically. Usually she coughs when eats. Today daughter was surprised that she wasn't coughing during SLP eval. Suspected silent aspiration. Did barium swallow test.  Shows normal oral phase, but has delayed pharyngeal response that leads to intermittent silent aspiration with thin and nectar thick liquids. Has weak cough responsive not able to clear aspirates. Some barium retained in the esophagus.  - Dysphagia diet 3 now and chin tuck recommended. Appreciate SLP recs. - likely needs outpatient GI workup.  Osteoporesis - with had dexa at PCP office 3 weeks ago per daughter which showed severe osteoporesis. Already was on fosamax weekly for her osteopenia previously with fracture.  - continue fosamax outpatient. Likely needs PTH therapy since osteoporesis is progressing despite being on fosamax. This should be decided outpatient with PCP - vit D 16. Starting vit d 50,000.  Mid back pain - chronic. No compression fracture visible on CXR - improving. Continue tylenol.   COPD - stable.  - duonebs q6hr prn, mucinex for cough.  GERD - prilosec at home. protonix here. Hx of Stroke - on asa 56. Continue Thyroid disease: on synthyroid . TSH 1.99. Continue synthyroid.  Dispo: Disposition is deferred at this time, awaiting improvement of current medical problems.  Anticipated discharge in approximately 1-2 day(s).   The patient does have a current PCP (Ailene Ravel, MD) and does need an [facility] hospital follow-up appointment after discharge.  The patient does have transportation limitations that hinder transportation to clinic appointments.  .Services Needed at time of discharge: Y = Yes, Blank = No PT:   OT:   RN:   Equipment:   Other:     LOS: 1 day   Hyacinth Meeker, MD 08/14/2014, 2:02 PM

## 2014-08-14 NOTE — Evaluation (Signed)
Physical Therapy Evaluation Patient Details Name: April Mccoy MRN: [id number] DOB: 12/15/1928 Today's Date: 08/14/2014   History of Present Illness  Pt admitted with cp determined to be caused by R rib fractures sustained in a recent fall while transfering w/c to/from toilet.  Clinical Impression  Pt admitted with/for cp from rib fracture.  Pt currently limited functionally due to the problems listed below.  (see problems list.)  Pt will benefit from PT to maximize function and safety to be able to get home safely with available assist of family .     Follow Up Recommendations Home health PT    Equipment Recommendations  None recommended by PT    Recommendations for Other Services       Precautions / Restrictions Precautions Precautions: Fall      Mobility  Bed Mobility Overal bed mobility: Needs Assistance Bed Mobility: Supine to Sit;Sit to Supine     Supine to sit: Supervision Sit to supine: Supervision   General bed mobility comments: pt able to mobilize to EOB and back to supine including repositioning, but rather uncoordinated at each task.  Transfers Overall transfer level: Needs assistance   Transfers: Sit to/from BJ'sStand;Stand Pivot Transfers Sit to Stand: Min guard Stand pivot transfers: Min guard       General transfer comment: pt does not stay in full control of the transfer once she has to move her feet and reach back to sit.  The result is an uncontrolled fall into the chair or bed.  Pt able to mildly improve mobility with cueing.  Ambulation/Gait                Stairs            Wheelchair Mobility    Modified Rankin (Stroke Patients Only)       Balance Overall balance assessment: Needs assistance Sitting-balance support: No upper extremity supported Sitting balance-Leahy Scale: Fair Sitting balance - Comments: loses balance posteriorly with any challenge     Standing balance-Leahy Scale: Poor Standing balance comment: must  hold to w/c for stability                             Pertinent Vitals/Pain Pain Assessment: No/denies pain    Home Living Family/patient expects to be discharged to:: Private residence Living Arrangements: Children Available Help at Discharge: Family;Other (Comment) (daughter comes to pt's home to help out from 6:30 to 14:00) Type of Home: Apartment Home Access: Level entry     Home Layout: One level Home Equipment: Wheelchair - manual;Shower seat;Bedside commode      Prior Function Level of Independence: Needs assistance   Gait / Transfers Assistance Needed: uses w/c for daily mobility  ADL's / Homemaking Assistance Needed: daughter assist with bathing, dressing and meal prep        Hand Dominance        Extremity/Trunk Assessment   Upper Extremity Assessment: Overall WFL for tasks assessed           Lower Extremity Assessment: Generalized weakness         Communication   Communication: Other (comment);No difficulties (slurred speech)  Cognition Arousal/Alertness: Awake/alert Behavior During Therapy: WFL for tasks assessed/performed Overall Cognitive Status: Within Functional Limits for tasks assessed                      General Comments      Exercises  Assessment/Plan    PT Assessment Patient needs continued PT services  PT Diagnosis Difficulty walking   PT Problem List Decreased strength;Decreased activity tolerance;Decreased balance;Decreased mobility;Decreased knowledge of use of DME;Decreased knowledge of precautions;Pain  PT Treatment Interventions DME instruction;Gait training;Functional mobility training;Therapeutic activities;Therapeutic exercise;Balance training;Neuromuscular re-education;Patient/family education   PT Goals (Current goals can be found in the Care Plan section) Acute Rehab PT Goals Patient Stated Goal: get back home PT Goal Formulation: With patient Time For Goal Achievement:  08/21/14 Potential to Achieve Goals: Fair    Frequency Min 3X/week   Barriers to discharge   pt is alone from 14:00 until bedtime and through the night    Co-evaluation               End of Session   Activity Tolerance: Patient tolerated treatment well Patient left: in bed;with call bell/phone within reach;with bed alarm set Nurse Communication: Mobility status    Functional Assessment Tool Used: clinical judgement Functional Limitation: Mobility: Walking and moving around Mobility: Walking and Moving Around Current Status ([id number](G8978): At least 1 percent but less than 20 percent impaired, limited or restricted Mobility: Walking and Moving Around Goal Status [phone](G8979): At least 1 percent but less than 20 percent impaired, limited or restricted    Time: 1650-1712 PT Time Calculation (min) (ACUTE ONLY): 22 min   Charges:   PT Evaluation $Initial PT Evaluation Tier I: 1 Procedure PT Treatments $Gait Training: 8-22 mins   PT G Codes:   Functional Assessment Tool Used: clinical judgement Functional Limitation: Mobility: Walking and moving around    April Mccoy, April Mccoy 08/14/2014, 5:26 PM 08/14/2014  [place] BingKen Reathel Turi, PT [phone]  (pager)

## 2014-08-14 NOTE — Progress Notes (Signed)
  Date: 08/14/2014  Patient name: April Mccoy  Medical record number: [id number]  Date of birth: 03/31/1929   I have seen and evaluated April Mccoy and discussed their care with the Residency Team. [name] was admitted for a CP R/O. She had a fall a few days ago [facility] She is non ambulatory. Her daughter visits and stays for hours QD. She lives in a retirement community and plans to return there.   Vitals stable HOH HRRR LCTAB ABD benign Ext no edema Able to sit up in bed rather independently, able to lift LE off bed  Labs and imaging reviewed.  Assessment and Plan: I have seen and evaluated the patient as outlined above. I agree with the formulated Assessment and Plan as detailed in the residents' admission note, with the following changes:   1. R rib fractures - this is the cause of her R sided pain as all other causes of CP (AMI, PE, pericarditis, eso rupture) have been r/o. The fx resulted from a fall. She has adequate pain control now. She is WC confined 2/2 freq falls and gen weakness and we will get PT consult to eval transfers.   2. Osteoporosis - she had a DEXA 3 weeks ago and started on alendronate. Her Vit d is 16 and we will provide high dose Vit D tx to replete. She will also need calcium supplementation.   3. Dysphagia - Dys 3 with nectar thick and chin tuck has been rec.   4. End of life - daughter has not have GOC discussion nor DNR/DNI discussion with her mother. We encouraged this as we would break every rib with CPR.  Likely D/C 10th with home health, PT, speech.   Burns SpainElizabeth A Butcher, MD 12/9/20153:14 PM

## 2014-08-14 NOTE — Progress Notes (Signed)
UR completed 

## 2014-08-14 NOTE — Procedures (Signed)
Objective Swallowing Evaluation: Bedside swallow evaluation  Patient Details  Name: April Mccoy MRN: [id number] Date of Birth: 12/19/1928  Today's Date: 08/14/2014 Time: [id number] SLP Time Calculation (min) (ACUTE ONLY): 14 min  Past Medical History:  Past Medical History  Diagnosis Date  . Hypertension   . Stroke     with h/o dysphagia post stroke  . Thyroid disease   . Left shoulder pain     DJD  . UTI (lower urinary tract infection)     Enterobacter aerogenes  . COPD (chronic obstructive pulmonary disease)   . GERD (gastroesophageal reflux disease)   . Abnormality of gait   . Hard of hearing   . Anemia   . Depression   . Tobacco abuse   . Wheelchair bound   . Bowel obstruction   . Fracture of coccyx     history  . Shortness of breath dyspnea    Past Surgical History:  Past Surgical History  Procedure Laterality Date  . Hernia repair    . Hip fracture surgery    . Joint replacement      b/l hips  . Gastrectomy      for ulcer  . Other surgical history      hysterectomy   . Other surgical history    . Cataract extraction    . Abdominal hysterectomy     HPI:  80 y.o  female admitted 08/13/14 with PMH stroke, HTN, tobacco abuse, GERD, depression anemia, dysphagia, osteopenia with fractures (osteoporosis), falls.  She presented after waking up with mild chest pain.She had shortness of breath initially with chest pain that is now improved/resolved.  She denies nausea/vomiting or sweating.  She has a chronic cough with white mucous intermittently but her cough is worse the last couple of days.  She complains of right flank/upper quadrant pain noticed since fall within the las week. Pain is worse with coughing and deep breathing (nothing makes better).  She has a history of falls with a recent fall a few days ago when her wheelchair flipped over while she was getting up from the toilet.       Assessment / Plan / Recommendation Clinical Impression  Dysphagia Diagnosis:  Moderate pharyngeal phase dysphagia  Clinical impression: Pt has a moderate pharyngeal dysphagia with suspected esophageal component. Oral phase is functional overall with quick transit into the pharynx; however, there is a delay in pharyngeal response that leads to intermittently silent aspiration with thin and nectar thick liquids. Sensed aspiration elicits a weak cough response that is not able to clear aspirates. A chin tuck is effective at containing nectar thick liquids in the valleculae until swallow response can be triggered, therefore effectively increasing airway protection. Solids transit through the pharynx well without airway compromise or significant residual. There appeared to be barium material retained in the esophagus (MD not present to confirm), which may also be contributing to patient's reported symptoms. Recommend Dys 3 diet and nectar thick liquids via chin tuck. SLP to continue to follow for tolerance and utilization of compensatory strategies.     Treatment Recommendation  Therapy as outlined in treatment plan below    Diet Recommendation Dysphagia 3 (Mechanical Soft);Nectar-thick liquid   Liquid Administration via: Straw Medication Administration: Whole meds with puree Supervision: Patient able to self feed;Full supervision/cueing for compensatory strategies Compensations: Slow rate;Small sips/bites;Follow solids with liquid Postural Changes and/or Swallow Maneuvers: Seated upright 90 degrees;Upright 30-60 min after meal;Chin tuck    Other  Recommendations Recommended Consults: MBS Oral  Care Recommendations: Oral care BID Other Recommendations: Order thickener from pharmacy;Prohibited food (jello, ice cream, thin soups);Remove water pitcher   Follow Up Recommendations  Home health SLP    Frequency and Duration min 2x/week  2 weeks   Pertinent Vitals/Pain n/a    SLP Swallow Goals     General Date of Onset: 08/13/14 HPI: 80 y.o  female admitted 08/13/14 with PMH  stroke, HTN, tobacco abuse, GERD, depression anemia, dysphagia, osteopenia with fractures (osteoporosis), falls.  She presented after waking up with mild chest pain.She had shortness of breath initially with chest pain that is now improved/resolved.  She denies nausea/vomiting or sweating.  She has a chronic cough with white mucous intermittently but her cough is worse the last couple of days.  She complains of right flank/upper quadrant pain noticed since fall within the las week. Pain is worse with coughing and deep breathing (nothing makes better).  She has a history of falls with a recent fall a few days ago when her wheelchair flipped over while she was getting up from the toilet.   Type of Study: Bedside swallow evaluation Reason for Referral: Objectively evaluate swallowing function Previous Swallow Assessment: none in chart, however documentation of need for nectar thick liquids in the past Diet Prior to this Study: NPO Temperature Spikes Noted: Yes (low grade) Respiratory Status: Room air History of Recent Intubation: No Behavior/Cognition: Alert;Cooperative;Pleasant mood;Requires cueing Oral Cavity - Dentition: Dentures, top Self-Feeding Abilities: Able to feed self Patient Positioning: Upright in chair Baseline Vocal Quality: Clear Volitional Cough: Weak Volitional Swallow: Able to elicit Anatomy: Within functional limits Pharyngeal Secretions: Not observed secondary MBS    Reason for Referral Objectively evaluate swallowing function   Oral Phase Oral Preparation/Oral Phase Oral Phase: WFL   Pharyngeal Phase Pharyngeal Phase Pharyngeal Phase: Impaired Pharyngeal - Nectar Pharyngeal - Nectar Cup: Delayed swallow initiation;Reduced anterior laryngeal mobility;Reduced laryngeal elevation;Penetration/Aspiration during swallow Penetration/Aspiration details (nectar cup): Material enters airway, passes BELOW cords without attempt by patient to eject out (silent aspiration) Pharyngeal  - Nectar Straw: Delayed swallow initiation;Reduced anterior laryngeal mobility;Reduced laryngeal elevation;Compensatory strategies attempted (Comment) (chin tuck effective at increasing airway protection) Penetration/Aspiration details (nectar straw): Material does not enter airway Pharyngeal - Thin Pharyngeal - Thin Teaspoon: Delayed swallow initiation;Reduced anterior laryngeal mobility;Reduced laryngeal elevation;Penetration/Aspiration before swallow Penetration/Aspiration details (thin teaspoon): Material enters airway, passes BELOW cords without attempt by patient to eject out (silent aspiration) Pharyngeal - Thin Cup: Delayed swallow initiation;Reduced anterior laryngeal mobility;Reduced laryngeal elevation;Penetration/Aspiration before swallow;Compensatory strategies attempted (Comment) (chin tuck ineffective) Penetration/Aspiration details (thin cup): Material enters airway, passes BELOW cords and not ejected out despite cough attempt by patient Pharyngeal - Solids Pharyngeal - Puree: Delayed swallow initiation;Reduced anterior laryngeal mobility;Reduced laryngeal elevation Penetration/Aspiration details (puree): Material does not enter airway Pharyngeal - Mechanical Soft: Delayed swallow initiation;Reduced anterior laryngeal mobility;Reduced laryngeal elevation Penetration/Aspiration details (mechanical soft): Material does not enter airway  Cervical Esophageal Phase    GO    Cervical Esophageal Phase Cervical Esophageal Phase: Impaired Cervical Esophageal Phase - Comment Cervical Esophageal Comment: barium material appeared to remain in esophagus    Functional Assessment Tool Used: skilled clinical judgment Functional Limitations: Swallowing Swallow Current Status (N5621(G8996): At least 40 percent but less than 60 percent impaired, limited or restricted Swallow Goal Status [phone](G8997): At least 20 percent but less than 40 percent impaired, limited or restricted     Maxcine HamLaura Paiewonsky,  M.A. CCC-SLP [phone]  Maxcine Hamaiewonsky, Manaal Mandala 08/14/2014, 12:10 PM

## 2014-08-14 NOTE — Evaluation (Signed)
Clinical/Bedside Swallow Evaluation Patient Details  Name: April NewcomerVallie M Renier MRN: [id number] Date of Birth: 05/07/1929  Today's Date: 08/14/2014 Time: 0950-1015 SLP Time Calculation (min) (ACUTE ONLY): 25 min  Past Medical History:  Past Medical History  Diagnosis Date  . Hypertension   . Stroke     with h/o dysphagia post stroke  . Thyroid disease   . Left shoulder pain     DJD  . UTI (lower urinary tract infection)     Enterobacter aerogenes  . COPD (chronic obstructive pulmonary disease)   . GERD (gastroesophageal reflux disease)   . Abnormality of gait   . Hard of hearing   . Anemia   . Depression   . Tobacco abuse   . Wheelchair bound   . Bowel obstruction   . Fracture of coccyx     history  . Shortness of breath dyspnea    Past Surgical History:  Past Surgical History  Procedure Laterality Date  . Hernia repair    . Hip fracture surgery    . Joint replacement      b/l hips  . Gastrectomy      for ulcer  . Other surgical history      hysterectomy   . Other surgical history    . Cataract extraction    . Abdominal hysterectomy     HPI:  80 y.o  female admitted 08/13/14 with PMH stroke, HTN, tobacco abuse, GERD, depression anemia, dysphagia, osteopenia with fractures (osteoporosis), falls.  She presented after waking up with mild chest pain.She had shortness of breath initially with chest pain that is now improved/resolved.  She denies nausea/vomiting or sweating.  She has a chronic cough with white mucous intermittently but her cough is worse the last couple of days.  She complains of right flank/upper quadrant pain noticed since fall within the las week. Pain is worse with coughing and deep breathing (nothing makes better).  She has a history of falls with a recent fall a few days ago when her wheelchair flipped over while she was getting up from the toilet.     Assessment / Plan / Recommendation Clinical Impression  Orders received and BSE administered today. Per  patient's daughter, patient has a long history of dysphagia with constant coughing while eating and drinking. Patient also avoids certain meats like chicken and steak due to "choking" with shortness of breath. Patient was administered thin liquids via cup and puree textures during the evaluation and demonstrated an intermittent wet vocal quality, however, she did not demonstrate any overt coughing episodes which the daughter reports is "shocking."  Due to patient's h/o of CVA's, GERD and dysphagia and recent difficulty at home, recommend a MBS to objectively assess her swallow function and for possible silent aspiration.  Patient's daughter verbalized understanding and agreement.     Aspiration Risk  Moderate    Diet Recommendation NPO        Other  Recommendations Recommended Consults: MBS   Follow Up Recommendations   (TBD)    Frequency and Duration TBD    Pertinent Vitals/Pain N/A      Swallow Study       General Date of Onset: 08/13/14 HPI: 80 y.o  female admitted 08/13/14 with PMH stroke, HTN, tobacco abuse, GERD, depression anemia, dysphagia, osteopenia with fractures (osteoporosis), falls.  She presented after waking up with mild chest pain.She had shortness of breath initially with chest pain that is now improved/resolved.  She denies nausea/vomiting or sweating.  She has a  chronic cough with white mucous intermittently but her cough is worse the last couple of days.  She complains of right flank/upper quadrant pain noticed since fall within the las week. Pain is worse with coughing and deep breathing (nothing makes better).  She has a history of falls with a recent fall a few days ago when her wheelchair flipped over while she was getting up from the toilet.   Type of Study: Bedside swallow evaluation Previous Swallow Assessment: N/A Diet Prior to this Study: NPO Temperature Spikes Noted: No (low grade fever) Respiratory Status: Room air History of Recent Intubation:  No Behavior/Cognition: Alert;Cooperative;Pleasant mood Oral Cavity - Dentition: Dentures, top (edentulous on bottom) Self-Feeding Abilities: Able to feed self Patient Positioning: Upright in bed Baseline Vocal Quality: Clear Volitional Cough: Weak Volitional Swallow: Able to elicit    Oral/Motor/Sensory Function Overall Oral Motor/Sensory Function: Appears within functional limits for tasks assessed   Ice Chips Ice chips: Impaired Presentation: Spoon Pharyngeal Phase Impairments: Wet Vocal Quality;Throat Clearing - Immediate   Thin Liquid Thin Liquid: Impaired Presentation: Cup;Self Fed Pharyngeal  Phase Impairments: Wet Vocal Quality    Nectar Thick Nectar Thick Liquid: Not tested   Honey Thick Honey Thick Liquid: Not tested   Puree Puree: Impaired Presentation: Self Fed;Spoon Pharyngeal Phase Impairments: Wet Vocal Quality   Solid   GO    Solid: Not tested       Meerab Maselli 08/14/2014,10:40 AM  Feliberto Gottronourtney Jessicamarie Amiri, MA, CCC-SLP [phone]

## 2014-08-14 NOTE — Plan of Care (Signed)
Problem: Phase II Progression Outcomes Goal: Hemodynamically stable Outcome: Completed/Met Date Met:  08/14/14 Goal: Anginal pain relieved Outcome: Completed/Met Date Met:  08/14/14 Goal: CV Risk Factors identified Outcome: Completed/Met Date Met:  08/14/14     

## 2014-08-15 DIAGNOSIS — R072 Precordial pain: Secondary | ICD-10-CM | POA: Diagnosis not present

## 2014-08-15 DIAGNOSIS — E039 Hypothyroidism, unspecified: Secondary | ICD-10-CM

## 2014-08-15 DIAGNOSIS — K219 Gastro-esophageal reflux disease without esophagitis: Secondary | ICD-10-CM

## 2014-08-15 DIAGNOSIS — I712 Thoracic aortic aneurysm, without rupture: Secondary | ICD-10-CM

## 2014-08-15 DIAGNOSIS — J449 Chronic obstructive pulmonary disease, unspecified: Secondary | ICD-10-CM

## 2014-08-15 MED ORDER — RESOURCE THICKENUP CLEAR PO POWD
ORAL | Status: DC | PRN
  Filled 2014-08-15: qty 125

## 2014-08-15 NOTE — Progress Notes (Addendum)
Subjective:   Day of hospitalization: 2  VSS.  No overnight events.  Pt is feeling better, denies any pain.  Wants to go home.     Objective:   Vital signs in last 24 hours: Filed Vitals:   08/14/14 0545 08/14/14 1340 08/14/14 2111 08/15/14 0406  BP: 119/62 116/69 150/76 142/75  Pulse: 81 79 79 83  Temp: 98.1 F (36.7 C) 98.5 F (36.9 C) 98 F (36.7 C) 98.4 F (36.9 C)  TempSrc: Oral Oral Oral Oral  Resp: 18 18 18 18   Height:      Weight: 102 lb 9.6 oz (46.539 kg)   102 lb 11.2 oz (46.584 kg)  SpO2: 92% 91% 94% 91%    Weight: Filed Weights   08/13/14 1215 08/14/14 0545 08/15/14 0406  Weight: 216 lb 14.9 oz (98.4 kg) 102 lb 9.6 oz (46.539 kg) 102 lb 11.2 oz (46.584 kg)    I/Os:  Intake/Output Summary (Last 24 hours) at 08/15/14 1109 Last data filed at 08/14/14 1700  Gross per 24 hour  Intake    480 ml  Output      0 ml  Net    480 ml    Physical Exam: Constitutional: Vital signs reviewed.  Patient is sitting up in bed in NAD and cooperative with exam.   HEENT: [place]/AT; PERRL, EOMI, conjunctivae normal, no scleral icterus  Cardiovascular: RRR, no MRG Pulmonary/Chest: normal respiratory effort, no accessory muscle use, CTAB, no wheezes, rales, or rhonchi Abdominal: Soft. +BS, NT/ND Neurological: A&O x3, CN II-XII grossly intact; non-focal exam Extremities: 2+DP b/l, no C/C/E  Skin: Warm, dry and intact.   Lab Results:  BMP:  Recent Labs Lab 08/13/14 0806 08/14/14 0257  NA 141 139  K 4.6 3.9  CL 105 103  CO2 26 26  GLUCOSE 91 83  BUN 32* 23  CREATININE 0.65 0.67  CALCIUM 10.4 9.6    CBC:  Recent Labs Lab 08/13/14 0806  WBC 8.0  HGB 11.8*  HCT 39.7  MCV 84.6  PLT 238      HA1C:     Recent Labs Lab 08/13/14 1500  HGBA1C 5.9*    Lipid Panel:  Recent Labs Lab 08/13/14 1500  CHOL 192  HDL 41  LDLCALC 124*  TRIG 133  CHOLHDL 4.7    LFTs:  Recent Labs Lab 08/13/14 0806  AST 15  ALT 20  ALKPHOS 104  BILITOT 0.3    PROT 7.1  ALBUMIN 3.7   Cardiac Enzymes:  Recent Labs Lab 08/13/14 0915 08/13/14 1500 08/13/14 2104  TROPONINI <0.30 <0.30 <0.30    EKG: EKG Interpretation  Date/Time:  Tuesday August 13 2014 07:46:36 EST Ventricular Rate:  77 PR Interval:  164 QRS Duration: 85 QT Interval:  398 QTC Calculation: 450 R Axis:   8 Text Interpretation:  Sinus rhythm Borderline repolarization abnormality no STEMI. no change Confirmed by Donnald GarrePfeiffer, MD, Lebron ConnersMarcy [phone]([id number]) on 08/13/2014 7:54:58 AM   BNP:  Recent Labs Lab 08/13/14 0806  PROBNP 230.3    D-Dimer:  Recent Labs Lab 08/13/14 1500  DDIMER 1.91*    Urinalysis:  Recent Labs Lab 08/14/14 0311  COLORURINE YELLOW  LABSPEC <1.005*  PHURINE 6.5  GLUCOSEU NEGATIVE  HGBUR NEGATIVE  BILIRUBINUR NEGATIVE  KETONESUR NEGATIVE  PROTEINUR NEGATIVE  UROBILINOGEN 1.0  NITRITE NEGATIVE  LEUKOCYTESUR NEGATIVE    Micro Results: No results found for this or any previous visit (from the past 240 hour(s)).  Blood Culture:    Component Value Date/Time  SDES URINE, RANDOM 04/19/2011 2110   SPECREQUEST WU:JWJXB ON [id number] @0041  04/19/2011 2110   CULT KLEBSIELLA OXYTOCA 04/19/2011 2110   REPTSTATUS 04/23/2011 FINAL 04/19/2011 2110    Studies/Results: Dg Ribs Unilateral Right  08/13/2014   CLINICAL DATA:  Larey Seat in bathroom yesterday.  Rib pain  EXAM: RIGHT RIBS - 2 VIEW  COMPARISON:  Chest x-ray today  FINDINGS: Fractures of 2 right lower lateral ribs with minimal displacement. Generalized osteopenia. Right lung is clear without effusion.  IMPRESSION: Fracture of 2 right lower lateral ribs.   Electronically Signed   By: Marlan Palau M.D.   On: 08/13/2014 13:36   Ct Angio Chest Pe W/cm &/or Wo Cm  08/13/2014   CLINICAL DATA:  Elevated D-dimer, chest pain  EXAM: CT ANGIOGRAPHY CHEST WITH CONTRAST  TECHNIQUE: Multidetector CT imaging of the chest was performed using the standard protocol during bolus administration of intravenous  contrast. Multiplanar CT image reconstructions and MIPs were obtained to evaluate the vascular anatomy.  CONTRAST:  OMNIPAQUE IOHEXOL 350 MG/ML SOLN  COMPARISON:  None.  FINDINGS: There is adequate opacification of the pulmonary arteries. There is no pulmonary embolus. The main pulmonary artery, right main pulmonary artery and left main pulmonary arteries are normal in size. The heart size is normal. There is no pericardial effusion. There is a small, 9 mm, pseudoaneurysm arising from the distal aortic arch along the anterolateral aspect.  There is a small right pleural effusion. There is no focal consolidation or pneumothorax. There is bibasilar atelectasis. There is mild centrilobular emphysema.  There is no axillary, hilar, or mediastinal adenopathy.  There is no lytic or blastic osseous lesion. Chronic T9 and T11 compression fractures.  The visualized portions of the upper abdomen are unremarkable.  Review of the MIP images confirms the above findings.  IMPRESSION: 1. No evidence of pulmonary embolus. 2. Small right pleural effusion. 3. Small 9 mm pseudoaneurysm arising from the distal intra lateral aortic arch.   Electronically Signed   By: Elige Ko   On: 08/13/2014 20:59   Dg Swallowing Func-speech Pathology  08/14/2014   Mammie Lorenzo, CCC-SLP     08/14/2014 12:11 PM Objective Swallowing Evaluation: Bedside swallow evaluation  Patient Details  Name: April Mccoy MRN: [id number] Date of Birth: Jan 15, 1929  Today's Date: 08/14/2014 Time: 7846-9629 SLP Time Calculation (min) (ACUTE ONLY): 14 min  Past Medical History:  Past Medical History  Diagnosis Date  . Hypertension   . Stroke     with h/o dysphagia post stroke  . Thyroid disease   . Left shoulder pain     DJD  . UTI (lower urinary tract infection)     Enterobacter aerogenes  . COPD (chronic obstructive pulmonary disease)   . GERD (gastroesophageal reflux disease)   . Abnormality of gait   . Hard of hearing   . Anemia   . Depression   .  Tobacco abuse   . Wheelchair bound   . Bowel obstruction   . Fracture of coccyx     history  . Shortness of breath dyspnea    Past Surgical History:  Past Surgical History  Procedure Laterality Date  . Hernia repair    . Hip fracture surgery    . Joint replacement      b/l hips  . Gastrectomy      for ulcer  . Other surgical history      hysterectomy   . Other surgical history    . Cataract extraction    .  Abdominal hysterectomy     HPI:  80 y.o  female admitted 08/13/14 with PMH stroke, HTN, tobacco  abuse, GERD, depression anemia, dysphagia, osteopenia with  fractures (osteoporosis), falls.  She presented after waking up  with mild chest pain.She had shortness of breath initially with  chest pain that is now improved/resolved.  She denies  nausea/vomiting or sweating.  She has a chronic cough with white  mucous intermittently but her cough is worse the last couple of  days.  She complains of right flank/upper quadrant pain noticed  since fall within the las week. Pain is worse with coughing and  deep breathing (nothing makes better).  She has a history of  falls with a recent fall a few days ago when her wheelchair  flipped over while she was getting up from the toilet.       Assessment / Plan / Recommendation Clinical Impression  Dysphagia Diagnosis: Moderate pharyngeal phase dysphagia  Clinical impression: Pt has a moderate pharyngeal dysphagia with  suspected esophageal component. Oral phase is functional overall  with quick transit into the pharynx; however, there is a delay in  pharyngeal response that leads to intermittently silent  aspiration with thin and nectar thick liquids. Sensed aspiration  elicits a weak cough response that is not able to clear  aspirates. A chin tuck is effective at containing nectar thick  liquids in the valleculae until swallow response can be  triggered, therefore effectively increasing airway protection.  Solids transit through the pharynx well without airway compromise  or  significant residual. There appeared to be barium material  retained in the esophagus (MD not present to confirm), which may  also be contributing to patient's reported symptoms. Recommend  Dys 3 diet and nectar thick liquids via chin tuck. SLP to  continue to follow for tolerance and utilization of compensatory  strategies.     Treatment Recommendation  Therapy as outlined in treatment plan below    Diet Recommendation Dysphagia 3 (Mechanical Soft);Nectar-thick  liquid   Liquid Administration via: Straw Medication Administration: Whole meds with puree Supervision: Patient able to self feed;Full supervision/cueing  for compensatory strategies Compensations: Slow rate;Small sips/bites;Follow solids with  liquid Postural Changes and/or Swallow Maneuvers: Seated upright 90  degrees;Upright 30-60 min after meal;Chin tuck    Other  Recommendations Recommended Consults: MBS Oral Care Recommendations: Oral care BID Other Recommendations: Order thickener from pharmacy;Prohibited  food (jello, ice cream, thin soups);Remove water pitcher   Follow Up Recommendations  Home health SLP    Frequency and Duration min 2x/week  2 weeks   Pertinent Vitals/Pain n/a    SLP Swallow Goals     General Date of Onset: 08/13/14 HPI: 80 y.o  female admitted 08/13/14 with PMH stroke, HTN,  tobacco abuse, GERD, depression anemia, dysphagia, osteopenia  with fractures (osteoporosis), falls.  She presented after waking  up with mild chest pain.She had shortness of breath initially  with chest pain that is now improved/resolved.  She denies  nausea/vomiting or sweating.  She has a chronic cough with white  mucous intermittently but her cough is worse the last couple of  days.  She complains of right flank/upper quadrant pain noticed  since fall within the las week. Pain is worse with coughing and  deep breathing (nothing makes better).  She has a history of  falls with a recent fall a few days ago when her wheelchair  flipped over while she was  getting up from the toilet.   Type of  Study: Bedside swallow evaluation Reason for Referral: Objectively evaluate swallowing function Previous Swallow Assessment: none in chart, however documentation  of need for nectar thick liquids in the past Diet Prior to this Study: NPO Temperature Spikes Noted: Yes (low grade) Respiratory Status: Room air History of Recent Intubation: No Behavior/Cognition: Alert;Cooperative;Pleasant mood;Requires  cueing Oral Cavity - Dentition: Dentures, top Self-Feeding Abilities: Able to feed self Patient Positioning: Upright in chair Baseline Vocal Quality: Clear Volitional Cough: Weak Volitional Swallow: Able to elicit Anatomy: Within functional limits Pharyngeal Secretions: Not observed secondary MBS    Reason for Referral Objectively evaluate swallowing function   Oral Phase Oral Preparation/Oral Phase Oral Phase: WFL   Pharyngeal Phase Pharyngeal Phase Pharyngeal Phase: Impaired Pharyngeal - Nectar Pharyngeal - Nectar Cup: Delayed swallow initiation;Reduced  anterior laryngeal mobility;Reduced laryngeal  elevation;Penetration/Aspiration during swallow Penetration/Aspiration details (nectar cup): Material enters  airway, passes BELOW cords without attempt by patient to eject  out (silent aspiration) Pharyngeal - Nectar Straw: Delayed swallow initiation;Reduced  anterior laryngeal mobility;Reduced laryngeal  elevation;Compensatory strategies attempted (Comment) (chin tuck  effective at increasing airway protection) Penetration/Aspiration details (nectar straw): Material does not  enter airway Pharyngeal - Thin Pharyngeal - Thin Teaspoon: Delayed swallow initiation;Reduced  anterior laryngeal mobility;Reduced laryngeal  elevation;Penetration/Aspiration before swallow Penetration/Aspiration details (thin teaspoon): Material enters  airway, passes BELOW cords without attempt by patient to eject  out (silent aspiration) Pharyngeal - Thin Cup: Delayed swallow initiation;Reduced  anterior  laryngeal mobility;Reduced laryngeal  elevation;Penetration/Aspiration before swallow;Compensatory  strategies attempted (Comment) (chin tuck ineffective) Penetration/Aspiration details (thin cup): Material enters  airway, passes BELOW cords and not ejected out despite cough  attempt by patient Pharyngeal - Solids Pharyngeal - Puree: Delayed swallow initiation;Reduced anterior  laryngeal mobility;Reduced laryngeal elevation Penetration/Aspiration details (puree): Material does not enter  airway Pharyngeal - Mechanical Soft: Delayed swallow initiation;Reduced  anterior laryngeal mobility;Reduced laryngeal elevation Penetration/Aspiration details (mechanical soft): Material does  not enter airway  Cervical Esophageal Phase    GO    Cervical Esophageal Phase Cervical Esophageal Phase: Impaired Cervical Esophageal Phase - Comment Cervical Esophageal Comment: barium material appeared to remain  in esophagus    Functional Assessment Tool Used: skilled clinical judgment Functional Limitations: Swallowing Swallow Current Status ([id number]): At least 40 percent but less than  60 percent impaired, limited or restricted Swallow Goal Status [phone]): At least 20 percent but less than 40  percent impaired, limited or restricted     Maxcine Ham, M.A. CCC-SLP [phone]  Maxcine Ham 08/14/2014, 12:10 PM     Medications:  Scheduled Meds: . aspirin EC  81 mg Oral Daily  . benazepril  5 mg Oral Daily  . calcium carbonate  1 tablet Oral BID WC  . dextromethorphan-guaiFENesin  1 tablet Oral BID  . enoxaparin (LOVENOX) injection  40 mg Subcutaneous Q24H  . escitalopram  5 mg Oral Daily  . feeding supplement (ENSURE COMPLETE)  237 mL Oral BID BM  . levothyroxine  25 mcg Oral QAC breakfast  . nicotine  21 mg Transdermal Daily  . nystatin   Topical TID  . pantoprazole  40 mg Oral Daily  . sodium chloride  3 mL Intravenous Q12H  . Vitamin D (Ergocalciferol)  50,000 Units Oral Q7 days   Continuous Infusions:  PRN  Meds: acetaminophen **OR** acetaminophen, ipratropium-albuterol, polyethylene glycol, RESOURCE THICKENUP CLEAR  Antibiotics: Antibiotics Given (last 72 hours)    None      Day of Hospitalization: 2  Consults:    Assessment/Plan:   Principal Problem:  Fracture of multiple ribs Active Problems:   Chest pain   GERD (gastroesophageal reflux disease)   Anemia   Depression   History of stroke   Hypertension   Thyroid disease   COPD (chronic obstructive pulmonary disease)   Dysphagia   Right flank pain   Mid back pain   Abnormality of gait   Tobacco abuse   Osteopenia  Right rib fractures S/p fall.  Complains of minimal pain.  Has severe osteoporosis on fosamax for 3 weeks; placed on VitD and calcium yesterday.   -PT/OT HH -tylenol for pain   Dysphagia Dysphagia is chronic due to previous CVA.  MBS shows normal oral phase, but has delayed pharyngeal response that leads to intermittent silent aspiration with thin and nectar thick liquids. Has weak cough responsive not able to clear aspirates.  There was some barium retained barium retained in the esophagus.  Recommended dysphagia 3 diet with chin tuck recommended.   -will defer further w/u to PCP if need GI f/u  Osteoporosis DEXA at PCP office 3 weeks ago per daughter which showed severe osteoporesis-->was put on fosamax at that time.  VitD level low checked during admission and is 16.   -continue fosamax outpatient -added VitD 50000 units for 8 weeks -added calcium carbonate 500mg  bid  -sent nicotine patches to pharmacy  -advised smoking cessation   Chronic back pain -continue tylenol    COPD -duonebs q6hr prn, mucinex for cough  GERD  Prilosec at home.  -continue PPI   H/O CVA -continue ASA 81mg   Hypothyroidism On synthyroid 25mcg at home.  TSH level 1.99. -continue synthyroid  Aortic arch pseudoaneurysm 9mm pseudoaneurysm seen on CTA chest.   -f/u with PCP   Disposition Discharge today with PT/OT,  SLP.  Daughter and patient were given information regarding DNR, etc. to discuss further with PCP.   LOS: 2 days   Marrian SalvageJacquelyn S Shacola Schussler, MD PGY-2, Internal Medicine Teaching Service 08/15/2014, 11:09 AM

## 2014-08-15 NOTE — Progress Notes (Signed)
CSW (Clinical Child psychotherapistocial Worker) received consult. CSW to provide pt and family with Advanced Directives booklet. Please consider palliative care consult for goals of care and DNR/code status discussion or MD will need to address. CSW unable to assist with this.  Kevyn Wengert, LCSWA [phone]

## 2014-08-15 NOTE — Care Management Note (Signed)
    Page 1 of 1   08/15/2014     10:18:21 AM CARE MANAGEMENT NOTE 08/15/2014  Patient:  April Mccoy,April Mccoy   Account Number:  [id number]  Date Initiated:  08/15/2014  Documentation initiated by:  GRAVES-BIGELOW,Jamen Loiseau  Subjective/Objective Assessment:   Pt admitted for fall with rib fractures. [facility] Pt is from home alone, however daughter is her aide that stays in the home each day until 1:30. Per daughter she will not need HH RN at this time.     Action/Plan:   [facility] and SOC ot begin within 24-48 hrs post d/c.   Anticipated DC Date:  08/15/2014   Anticipated DC Plan:  HOME W HOME HEALTH SERVICES      DC Planning Services  CM consult      [facility]   Choice offered to / List presented to:  C-4 Adult Children        HH arranged  HH-2 PT  HH-3 OT  HH-5 SPEECH THERAPY      HH agency  [facility]   Status of service:  Completed, signed off Medicare Important Message given?  NO (If response is "NO", the following Medicare IM given date fields will be blank) Date Medicare IM given:   Medicare IM given by:   Date Additional Medicare IM given:   Additional Medicare IM given by:    Discharge Disposition:  HOME W HOME HEALTH SERVICES  Per UR Regulation:  Reviewed for med. necessity/level of care/duration of stay  If discussed at Long Length of Stay Meetings, dates discussed:    Comments:

## 2014-08-15 NOTE — Plan of Care (Signed)
Problem: Discharge Progression Outcomes Goal: No anginal pain Outcome: Completed/Met Date Met:  08/15/14 CONTINUES TO HAVE RIB FX PAIN Goal: Complications resolved/controlled Outcome: Adequate for Discharge Goal: Barriers To Progression Addressed/Resolved Outcome: Not Applicable Date Met:  79/48/01 Goal: Discharge plan in place and appropriate Outcome: Completed/Met Date Met:  08/15/14 Goal: Vascular site scale level 0 - I Vascular Site Scale Level 0: No bruising/bleeding/hematoma Level I (Mild): Bruising/Ecchymosis, minimal bleeding/ooozing, palpable hematoma < 3 cm Level II (Moderate): Bleeding not affecting hemodynamic parameters, pseudoaneurysm, palpable hematoma > 3 cm Level III (Severe) Bleeding which affects hemodynamic parameters or retroperitoneal hemorrhage  Outcome: Not Applicable Date Met:  65/53/74 Goal: Tolerates diet Outcome: Completed/Met Date Met:  08/15/14 Goal: Activity appropriate for discharge plan Outcome: Completed/Met Date Met:  08/15/14 BACK TO BASELINE

## 2014-08-15 NOTE — Progress Notes (Signed)
Speech Language Pathology Treatment: Dysphagia  Patient Details Name: April Mccoy MRN: [id number] DOB: 09/25/1928 Today's Date: 08/15/2014 Time: [id number] SLP Time Calculation (min) (ACUTE ONLY): 12 min  Assessment / Plan / Recommendation Clinical Impression  Skilled treatment session focused on addressing dysphagia goals.  Patient and daughter report that patient will be discharging today; SLP asked if there were questions present prior to discharge.  Daughter asked appropriate question regarding what liquids need to be thickened, where to obtain thickener and how to thicken liquids to a nectar-thick consistency; SLP educated using teach back method.  Patient reported being full but was agreeable to consuming nectar-thick liquid sips via cup, which resulted in overt cough in 50% of trials due to difficulty with timely coordination of chin prior to swallow initiation.  Per objective assessment straw was helpful in facilitating chin tuck throughout swallow and bedside presentation reflected this with cough x1.  Daughter observed and verbalized understanding.  Patient ready for discharge with home health follow-up.     HPI HPI: 80 y.o  female admitted 08/13/14 with PMH stroke, HTN, tobacco abuse, GERD, depression anemia, dysphagia, osteopenia with fractures (osteoporosis), falls.  She presented after waking up with mild chest pain. She had shortness of breath initially with chest pain that is now improved/resolved.  She denies nausea/vomiting or sweating.  She has a chronic cough with white mucous intermittently but her cough is worse the last couple of days.  She complains of right flank/upper quadrant pain noticed since fall within the las week. Pain is worse with coughing and deep breathing (nothing makes better).  She has a history of falls with a recent fall a few days ago when her wheelchair flipped over while she was getting up from the toilet.     Pertinent Vitals Pain Assessment: No/denies pain   SLP Plan  Continue with current plan of care    Recommendations Diet recommendations: Dysphagia 3 (mechanical soft);Nectar-thick liquid Liquids provided via: Cup;Straw Medication Administration: Whole meds with puree Supervision: Patient able to self feed;Full supervision/cueing for compensatory strategies Compensations: Slow rate;Small sips/bites;Follow solids with liquid Postural Changes and/or Swallow Maneuvers: Seated upright 90 degrees;Upright 30-60 min after meal;Chin tuck              Oral Care Recommendations: Oral care BID Follow up Recommendations: Home health SLP Plan: Continue with current plan of care    GO Functional Assessment Tool Used: skilled clinical judgment Functional Limitations: Swallowing Swallow Current Status (H0865(G8996): At least 40 percent but less than 60 percent impaired, limited or restricted Swallow Goal Status [phone](G8997): At least 20 percent but less than 40 percent impaired, limited or restricted   Charlane FerrettiMelissa Allen Basista, M.A., CCC-SLP [phone]  Dorrene Bently 08/15/2014, 11:46 AM

## 2014-08-15 NOTE — Progress Notes (Signed)
  Date: 08/15/2014  Patient name: April Mccoy  Medical record number: [id number]  Date of birth: 12/04/1928   This patient has been seen and the plan of care was discussed with the house staff. Please see their note for complete details. I concur with their findings with the following additions/corrections: [name] looks great today. She is ready to home home. Her pain is still present but controlled. Home with PT/OT/speech. Her daughter is working on DNR/POA/etc.   Burns SpainElizabeth A Butcher, MD 08/15/2014, 11:46 AM

## 2014-09-09 DIAGNOSIS — F329 Major depressive disorder, single episode, unspecified: Secondary | ICD-10-CM | POA: Diagnosis not present

## 2014-09-09 DIAGNOSIS — R32 Unspecified urinary incontinence: Secondary | ICD-10-CM | POA: Diagnosis not present

## 2014-09-09 DIAGNOSIS — I10 Essential (primary) hypertension: Secondary | ICD-10-CM | POA: Diagnosis not present

## 2014-09-09 DIAGNOSIS — R1310 Dysphagia, unspecified: Secondary | ICD-10-CM | POA: Diagnosis not present

## 2014-09-09 DIAGNOSIS — Z720 Tobacco use: Secondary | ICD-10-CM | POA: Diagnosis not present

## 2014-09-09 DIAGNOSIS — S2249XD Multiple fractures of ribs, unspecified side, subsequent encounter for fracture with routine healing: Secondary | ICD-10-CM | POA: Diagnosis not present

## 2014-09-09 DIAGNOSIS — W19XXXD Unspecified fall, subsequent encounter: Secondary | ICD-10-CM | POA: Diagnosis not present

## 2014-09-09 DIAGNOSIS — J449 Chronic obstructive pulmonary disease, unspecified: Secondary | ICD-10-CM | POA: Diagnosis not present

## 2014-09-09 DIAGNOSIS — H9193 Unspecified hearing loss, bilateral: Secondary | ICD-10-CM | POA: Diagnosis not present

## 2014-10-15 DIAGNOSIS — F418 Other specified anxiety disorders: Secondary | ICD-10-CM | POA: Diagnosis not present

## 2014-10-15 DIAGNOSIS — Z8673 Personal history of transient ischemic attack (TIA), and cerebral infarction without residual deficits: Secondary | ICD-10-CM | POA: Diagnosis not present

## 2014-10-15 DIAGNOSIS — E039 Hypothyroidism, unspecified: Secondary | ICD-10-CM | POA: Diagnosis not present

## 2014-10-15 DIAGNOSIS — Z79899 Other long term (current) drug therapy: Secondary | ICD-10-CM | POA: Diagnosis not present

## 2014-10-15 DIAGNOSIS — M810 Age-related osteoporosis without current pathological fracture: Secondary | ICD-10-CM | POA: Diagnosis not present

## 2014-10-15 DIAGNOSIS — I10 Essential (primary) hypertension: Secondary | ICD-10-CM | POA: Diagnosis not present

## 2014-10-19 ENCOUNTER — Inpatient Hospital Stay: Admit: 2014-10-19 | Payer: Self-pay | Admitting: Gastroenterology

## 2014-10-25 DIAGNOSIS — R32 Unspecified urinary incontinence: Secondary | ICD-10-CM | POA: Diagnosis not present

## 2014-10-28 DIAGNOSIS — R32 Unspecified urinary incontinence: Secondary | ICD-10-CM | POA: Diagnosis not present

## 2014-12-02 DIAGNOSIS — R739 Hyperglycemia, unspecified: Secondary | ICD-10-CM | POA: Diagnosis not present

## 2014-12-02 DIAGNOSIS — M6281 Muscle weakness (generalized): Secondary | ICD-10-CM | POA: Diagnosis not present

## 2014-12-02 DIAGNOSIS — R21 Rash and other nonspecific skin eruption: Secondary | ICD-10-CM | POA: Diagnosis not present

## 2014-12-02 DIAGNOSIS — R1310 Dysphagia, unspecified: Secondary | ICD-10-CM | POA: Diagnosis not present

## 2014-12-02 DIAGNOSIS — K219 Gastro-esophageal reflux disease without esophagitis: Secondary | ICD-10-CM | POA: Diagnosis not present

## 2014-12-02 DIAGNOSIS — R42 Dizziness and giddiness: Secondary | ICD-10-CM | POA: Diagnosis not present

## 2014-12-02 DIAGNOSIS — R05 Cough: Secondary | ICD-10-CM | POA: Diagnosis not present

## 2014-12-02 DIAGNOSIS — F329 Major depressive disorder, single episode, unspecified: Secondary | ICD-10-CM | POA: Diagnosis not present

## 2014-12-02 DIAGNOSIS — Z87891 Personal history of nicotine dependence: Secondary | ICD-10-CM | POA: Diagnosis not present

## 2014-12-02 DIAGNOSIS — J189 Pneumonia, unspecified organism: Secondary | ICD-10-CM | POA: Diagnosis not present

## 2014-12-02 DIAGNOSIS — D509 Iron deficiency anemia, unspecified: Secondary | ICD-10-CM | POA: Diagnosis not present

## 2014-12-02 DIAGNOSIS — H9190 Unspecified hearing loss, unspecified ear: Secondary | ICD-10-CM | POA: Diagnosis not present

## 2014-12-02 DIAGNOSIS — I10 Essential (primary) hypertension: Secondary | ICD-10-CM | POA: Diagnosis not present

## 2014-12-02 DIAGNOSIS — R0989 Other specified symptoms and signs involving the circulatory and respiratory systems: Secondary | ICD-10-CM | POA: Diagnosis not present

## 2014-12-02 DIAGNOSIS — R7309 Other abnormal glucose: Secondary | ICD-10-CM | POA: Diagnosis not present

## 2015-01-25 ENCOUNTER — Emergency Department (HOSPITAL_COMMUNITY): Payer: Medicare Other

## 2015-01-25 ENCOUNTER — Encounter (HOSPITAL_COMMUNITY): Payer: Self-pay | Admitting: Emergency Medicine

## 2015-01-25 ENCOUNTER — Observation Stay (HOSPITAL_COMMUNITY)
Admission: EM | Admit: 2015-01-25 | Discharge: 2015-01-29 | Disposition: A | Discharge status: Home / Self Care | Payer: Medicare Other | Attending: Internal Medicine | Admitting: Internal Medicine

## 2015-01-25 DIAGNOSIS — F329 Major depressive disorder, single episode, unspecified: Secondary | ICD-10-CM | POA: Insufficient documentation

## 2015-01-25 DIAGNOSIS — K2210 Ulcer of esophagus without bleeding: Principal | ICD-10-CM | POA: Diagnosis present

## 2015-01-25 DIAGNOSIS — Z87891 Personal history of nicotine dependence: Secondary | ICD-10-CM | POA: Insufficient documentation

## 2015-01-25 DIAGNOSIS — Z993 Dependence on wheelchair: Secondary | ICD-10-CM | POA: Diagnosis not present

## 2015-01-25 DIAGNOSIS — Z90710 Acquired absence of both cervix and uterus: Secondary | ICD-10-CM | POA: Diagnosis not present

## 2015-01-25 DIAGNOSIS — R0602 Shortness of breath: Secondary | ICD-10-CM

## 2015-01-25 DIAGNOSIS — Z833 Family history of diabetes mellitus: Secondary | ICD-10-CM | POA: Insufficient documentation

## 2015-01-25 DIAGNOSIS — J432 Centrilobular emphysema: Secondary | ICD-10-CM | POA: Diagnosis not present

## 2015-01-25 DIAGNOSIS — Z720 Tobacco use: Secondary | ICD-10-CM | POA: Diagnosis present

## 2015-01-25 DIAGNOSIS — R531 Weakness: Secondary | ICD-10-CM | POA: Diagnosis not present

## 2015-01-25 DIAGNOSIS — K219 Gastro-esophageal reflux disease without esophagitis: Secondary | ICD-10-CM | POA: Insufficient documentation

## 2015-01-25 DIAGNOSIS — R0902 Hypoxemia: Secondary | ICD-10-CM

## 2015-01-25 DIAGNOSIS — Z8249 Family history of ischemic heart disease and other diseases of the circulatory system: Secondary | ICD-10-CM | POA: Insufficient documentation

## 2015-01-25 DIAGNOSIS — R1310 Dysphagia, unspecified: Secondary | ICD-10-CM

## 2015-01-25 DIAGNOSIS — R05 Cough: Secondary | ICD-10-CM | POA: Diagnosis not present

## 2015-01-25 DIAGNOSIS — I10 Essential (primary) hypertension: Secondary | ICD-10-CM

## 2015-01-25 DIAGNOSIS — Z8673 Personal history of transient ischemic attack (TIA), and cerebral infarction without residual deficits: Secondary | ICD-10-CM | POA: Diagnosis not present

## 2015-01-25 DIAGNOSIS — R112 Nausea with vomiting, unspecified: Secondary | ICD-10-CM | POA: Diagnosis present

## 2015-01-25 DIAGNOSIS — Z809 Family history of malignant neoplasm, unspecified: Secondary | ICD-10-CM | POA: Insufficient documentation

## 2015-01-25 DIAGNOSIS — K449 Diaphragmatic hernia without obstruction or gangrene: Secondary | ICD-10-CM | POA: Diagnosis not present

## 2015-01-25 DIAGNOSIS — K224 Dyskinesia of esophagus: Secondary | ICD-10-CM | POA: Diagnosis present

## 2015-01-25 DIAGNOSIS — Z96643 Presence of artificial hip joint, bilateral: Secondary | ICD-10-CM | POA: Diagnosis not present

## 2015-01-25 DIAGNOSIS — J90 Pleural effusion, not elsewhere classified: Secondary | ICD-10-CM | POA: Diagnosis not present

## 2015-01-25 DIAGNOSIS — E079 Disorder of thyroid, unspecified: Secondary | ICD-10-CM

## 2015-01-25 DIAGNOSIS — Z7982 Long term (current) use of aspirin: Secondary | ICD-10-CM | POA: Diagnosis not present

## 2015-01-25 DIAGNOSIS — J449 Chronic obstructive pulmonary disease, unspecified: Secondary | ICD-10-CM

## 2015-01-25 DIAGNOSIS — R404 Transient alteration of awareness: Secondary | ICD-10-CM | POA: Diagnosis not present

## 2015-01-25 DIAGNOSIS — K269 Duodenal ulcer, unspecified as acute or chronic, without hemorrhage or perforation: Secondary | ICD-10-CM | POA: Diagnosis present

## 2015-01-25 LAB — I-STAT ARTERIAL BLOOD GAS, ED
ACID-BASE DEFICIT: 1 mmol/L (ref 0.0–2.0)
Bicarbonate: 25.5 mEq/L — ABNORMAL HIGH (ref 20.0–24.0)
O2 SAT: 95 %
PCO2 ART: 49.6 mmHg — AB (ref 35.0–45.0)
PO2 ART: 83 mmHg (ref 80.0–100.0)
Patient temperature: 98.6
TCO2: 27 mmol/L (ref 0–100)
pH, Arterial: 7.32 — ABNORMAL LOW (ref 7.350–7.450)

## 2015-01-25 LAB — CBC WITH DIFFERENTIAL/PLATELET
BASOS ABS: 0.1 10*3/uL (ref 0.0–0.1)
Basophils Relative: 1 % (ref 0–1)
Eosinophils Absolute: 0.2 10*3/uL (ref 0.0–0.7)
Eosinophils Relative: 3 % (ref 0–5)
HCT: 33.3 % — ABNORMAL LOW (ref 36.0–46.0)
HEMOGLOBIN: 9.9 g/dL — AB (ref 12.0–15.0)
LYMPHS PCT: 21 % (ref 12–46)
Lymphs Abs: 1.4 10*3/uL (ref 0.7–4.0)
MCH: 23.9 pg — ABNORMAL LOW (ref 26.0–34.0)
MCHC: 29.7 g/dL — ABNORMAL LOW (ref 30.0–36.0)
MCV: 80.4 fL (ref 78.0–100.0)
MONO ABS: 0.7 10*3/uL (ref 0.1–1.0)
Monocytes Relative: 11 % (ref 3–12)
NEUTROS ABS: 4.1 10*3/uL (ref 1.7–7.7)
Neutrophils Relative %: 64 % (ref 43–77)
Platelets: 238 10*3/uL (ref 150–400)
RBC: 4.14 MIL/uL (ref 3.87–5.11)
RDW: 14.1 % (ref 11.5–15.5)
WBC: 6.5 10*3/uL (ref 4.0–10.5)

## 2015-01-25 LAB — COMPREHENSIVE METABOLIC PANEL
ALBUMIN: 3.1 g/dL — AB (ref 3.5–5.0)
ALT: 14 U/L (ref 14–54)
AST: 16 U/L (ref 15–41)
Alkaline Phosphatase: 70 U/L (ref 38–126)
Anion gap: 7 (ref 5–15)
BILIRUBIN TOTAL: 0.5 mg/dL (ref 0.3–1.2)
BUN: 15 mg/dL (ref 6–20)
CALCIUM: 9.4 mg/dL (ref 8.9–10.3)
CO2: 25 mmol/L (ref 22–32)
Chloride: 106 mmol/L (ref 101–111)
Creatinine, Ser: 0.67 mg/dL (ref 0.44–1.00)
GFR calc Af Amer: >60 mL/min (ref >60)
GFR calc non Af Amer: >60 mL/min (ref >60)
GLUCOSE: 96 mg/dL (ref 65–99)
Potassium: 4 mmol/L (ref 3.5–5.1)
SODIUM: 138 mmol/L (ref 135–145)
Total Protein: 6 g/dL — ABNORMAL LOW (ref 6.5–8.1)

## 2015-01-25 LAB — URINALYSIS, ROUTINE W REFLEX MICROSCOPIC
Bilirubin Urine: NEGATIVE
GLUCOSE, UA: NEGATIVE mg/dL
Hgb urine dipstick: NEGATIVE
Ketones, ur: NEGATIVE mg/dL
Leukocytes, UA: NEGATIVE
NITRITE: NEGATIVE
PH: 7 (ref 5.0–8.0)
Protein, ur: NEGATIVE mg/dL
SPECIFIC GRAVITY, URINE: 1.011 (ref 1.005–1.030)
Urobilinogen, UA: 1 mg/dL (ref 0.0–1.0)

## 2015-01-25 LAB — LIPASE, BLOOD: Lipase: 17 U/L — ABNORMAL LOW (ref 22–51)

## 2015-01-25 LAB — TROPONIN I: Troponin I: 0.03 ng/mL (ref <0.031)

## 2015-01-25 MED ORDER — SODIUM CHLORIDE 0.9 % IV SOLN: 250.0000 mL | INTRAVENOUS | Status: DC | PRN

## 2015-01-25 MED ORDER — IPRATROPIUM-ALBUTEROL 0.5-2.5 (3) MG/3ML IN SOLN
3.0000 mL | RESPIRATORY_TRACT | Status: DC
  Filled 2015-01-25: qty 3

## 2015-01-25 MED ORDER — HYDROMORPHONE HCL 1 MG/ML IJ SOLN: 0.5000 mg | INTRAMUSCULAR | Status: DC | PRN

## 2015-01-25 MED ORDER — SODIUM CHLORIDE 0.9 % IJ SOLN
3.0000 mL | Freq: Two times a day (BID) | INTRAMUSCULAR | Status: DC
  Administered 2015-01-26 – 2015-01-28 (×4): 3 mL via INTRAVENOUS

## 2015-01-25 MED ORDER — ESCITALOPRAM OXALATE 5 MG PO TABS
5.0000 mg | ORAL_TABLET | Freq: Every day | ORAL | Status: DC
  Administered 2015-01-26 – 2015-01-29 (×4): 5 mg via ORAL
  Filled 2015-01-25 (×4): qty 1

## 2015-01-25 MED ORDER — ALUM & MAG HYDROXIDE-SIMETH 200-200-20 MG/5ML PO SUSP: 30.0000 mL | Freq: Four times a day (QID) | ORAL | Status: DC | PRN

## 2015-01-25 MED ORDER — ONDANSETRON HCL 4 MG/2ML IJ SOLN
4.0000 mg | Freq: Once | INTRAMUSCULAR | Status: AC
  Administered 2015-01-25: 4 mg via INTRAVENOUS
  Filled 2015-01-25: qty 2

## 2015-01-25 MED ORDER — ACETAMINOPHEN 650 MG RE SUPP: 650.0000 mg | Freq: Four times a day (QID) | RECTAL | Status: DC | PRN

## 2015-01-25 MED ORDER — IOHEXOL 350 MG/ML SOLN
100.0000 mL | Freq: Once | INTRAVENOUS | Status: AC | PRN
  Administered 2015-01-25: 100 mL via INTRAVENOUS

## 2015-01-25 MED ORDER — ENOXAPARIN SODIUM 30 MG/0.3ML ~~LOC~~ SOLN
30.0000 mg | SUBCUTANEOUS | Status: DC
  Administered 2015-01-26 – 2015-01-27 (×2): 30 mg via SUBCUTANEOUS
  Filled 2015-01-25 (×3): qty 0.3

## 2015-01-25 MED ORDER — CALCIUM CARBONATE 1250 (500 CA) MG PO TABS
1.0000 | ORAL_TABLET | Freq: Two times a day (BID) | ORAL | Status: DC
  Administered 2015-01-26 – 2015-01-29 (×7): 500 mg via ORAL
  Filled 2015-01-25 (×10): qty 1

## 2015-01-25 MED ORDER — BENAZEPRIL HCL 5 MG PO TABS
5.0000 mg | ORAL_TABLET | Freq: Every day | ORAL | Status: DC
  Administered 2015-01-26 – 2015-01-29 (×4): 5 mg via ORAL
  Filled 2015-01-25 (×4): qty 1

## 2015-01-25 MED ORDER — ASPIRIN EC 81 MG PO TBEC
81.0000 mg | DELAYED_RELEASE_TABLET | Freq: Every day | ORAL | Status: DC
  Administered 2015-01-26 – 2015-01-29 (×4): 81 mg via ORAL
  Filled 2015-01-25 (×4): qty 1

## 2015-01-25 MED ORDER — ONDANSETRON HCL 4 MG PO TABS
4.0000 mg | ORAL_TABLET | Freq: Four times a day (QID) | ORAL | Status: DC | PRN
Start: 2015-01-25 — End: 2015-01-29

## 2015-01-25 MED ORDER — SODIUM CHLORIDE 0.9 % IV BOLUS (SEPSIS)
1000.0000 mL | Freq: Once | INTRAVENOUS | Status: AC
  Administered 2015-01-25: 1000 mL via INTRAVENOUS

## 2015-01-25 MED ORDER — ACETAMINOPHEN 325 MG PO TABS: 650.0000 mg | ORAL_TABLET | Freq: Four times a day (QID) | ORAL | Status: DC | PRN

## 2015-01-25 MED ORDER — ONDANSETRON HCL 4 MG/2ML IJ SOLN: 4.0000 mg | Freq: Four times a day (QID) | INTRAMUSCULAR | Status: DC | PRN

## 2015-01-25 MED ORDER — OXYCODONE HCL 5 MG PO TABS: 5.0000 mg | ORAL_TABLET | ORAL | Status: DC | PRN

## 2015-01-25 MED ORDER — SODIUM CHLORIDE 0.9 % IJ SOLN
3.0000 mL | Freq: Two times a day (BID) | INTRAMUSCULAR | Status: DC
Start: 2015-01-25 — End: 2015-01-29
  Administered 2015-01-26 – 2015-01-28 (×2): 3 mL via INTRAVENOUS

## 2015-01-25 MED ORDER — SODIUM CHLORIDE 0.9 % IJ SOLN: 3.0000 mL | INTRAMUSCULAR | Status: DC | PRN

## 2015-01-25 MED ORDER — PANTOPRAZOLE SODIUM 40 MG PO TBEC
40.0000 mg | DELAYED_RELEASE_TABLET | Freq: Every day | ORAL | Status: DC
  Administered 2015-01-26 – 2015-01-29 (×4): 40 mg via ORAL
  Filled 2015-01-25 (×4): qty 1

## 2015-01-25 MED ORDER — LEVOTHYROXINE SODIUM 25 MCG PO TABS
25.0000 ug | ORAL_TABLET | Freq: Every day | ORAL | Status: DC
  Administered 2015-01-26 – 2015-01-29 (×3): 25 ug via ORAL
  Filled 2015-01-25 (×5): qty 1

## 2015-01-25 NOTE — H&P (Signed)
[facility] Admission History and Physical       April Mccoy ZOX:[id number] DOB: 06/17/1929 DOA: 01/25/2015  Referring physician: EDP PCP: Ailene Ravel, MD  Specialists:   Chief Complaint: SOB and Nausea and Vomiting  HPI: April Mccoy is a 81 y.o. female with a history of COPD, HTN CVA hx, Hypothyroid who was brought to the ED due to 3-4 days of increased nausea and vomiting after eating.  She reports having increased SOB but denies any chest pain.   She denies any fevers or chills.   She was evaluated in the ED and was found to have hypoxemia with decreased O2 sats of 82% on RA.   She was started on 2 liters NCO2 and had improvement.      Review of Systems:  Constitutional: No Weight Loss, No Weight Gain, Night Sweats, Fevers, Chills, Dizziness, Light Headedness, Fatigue, or Generalized Weakness HEENT: No Headaches, +Chronic Difficulty Swallowing,Tooth/Dental Problems,Sore Throat,  No Sneezing, Rhinitis, Ear Ache, Nasal Congestion, or Post Nasal Drip,  Cardio-vascular:  No Chest pain, Orthopnea, PND, Edema in Lower Extremities, Anasarca, Dizziness, Palpitations  Resp:  +Dyspnea, No DOE, No Productive Cough, No Non-Productive Cough, No Hemoptysis, No Wheezing.    GI: No Heartburn, Indigestion, Abdominal Pain, +Nausea, +Vomiting, Diarrhea, Constipation, Hematemesis, Hematochezia, Melena, Change in Bowel Habits,  Loss of Appetite  GU: No Dysuria, No Change in Color of Urine, No Urgency or Urinary Frequency, No Flank pain.  Musculoskeletal: No Joint Pain or Swelling, No Decreased Range of Motion, No Back Pain.  Neurologic: No Syncope, No Seizures, Muscle Weakness, Paresthesia, Vision Disturbance or Loss, No Diplopia, No Vertigo, No Difficulty Walking,  Skin: No Rash or Lesions. Psych: No Change in Mood or Affect, No Depression or Anxiety, No Memory loss, No Confusion, or Hallucinations   Past Medical History  Diagnosis Date  . Hypertension   . Stroke     with h/o  dysphagia post stroke  . Thyroid disease   . Left shoulder pain     DJD  . UTI (lower urinary tract infection)     Enterobacter aerogenes  . COPD (chronic obstructive pulmonary disease)   . GERD (gastroesophageal reflux disease)   . Abnormality of gait   . Hard of hearing   . Anemia   . Depression   . Tobacco abuse   . Wheelchair bound   . Bowel obstruction   . Fracture of coccyx     history  . Shortness of breath dyspnea      Past Surgical History  Procedure Laterality Date  . Hernia repair    . Hip fracture surgery    . Joint replacement      b/l hips  . Gastrectomy      for ulcer  . Other surgical history      hysterectomy   . Other surgical history    . Cataract extraction    . Abdominal hysterectomy        Prior to Admission medications   Medication Sig Start Date End Date Taking? Authorizing Provider  acetaminophen (TYLENOL) 325 MG tablet Take 650 mg by mouth every 6 (six) hours as needed for mild pain.   Yes Historical Provider, MD  alendronate (FOSAMAX) 70 MG tablet Take 70 mg by mouth once a week.  07/31/14  Yes Historical Provider, MD  aspirin EC 81 MG tablet Take 81 mg by mouth daily.   Yes Historical Provider, MD  benazepril (LOTENSIN) 10 MG tablet Take 5 mg by mouth daily.  02/28/14  Yes Historical Provider, MD  calcium carbonate (OS-CAL - DOSED IN MG OF ELEMENTAL CALCIUM) 1250 MG tablet Take 1 tablet (500 mg of elemental calcium total) by mouth 2 (two) times daily with a meal. 08/14/14  Yes Marrian SalvageJacquelyn S Gill, MD  escitalopram (LEXAPRO) 5 MG tablet Take 5 mg by mouth daily.   Yes Historical Provider, MD  ipratropium-albuterol (DUONEB) 0.5-2.5 (3) MG/3ML SOLN Inhale 3 mLs into the lungs daily.  07/25/14  Yes Historical Provider, MD  levothyroxine (SYNTHROID, LEVOTHROID) 25 MCG tablet Take 25 mcg by mouth daily before breakfast.   Yes Historical Provider, MD  nicotine (NICODERM CQ - DOSED IN MG/24 HOURS) 21 mg/24hr patch Place 1 patch (21 mg total) onto the skin  daily. Patient not taking: Reported on 01/25/2015 08/14/14   Marrian SalvageJacquelyn S Gill, MD  omeprazole (PRILOSEC) 40 MG capsule Take 40 mg by mouth daily.   Yes Historical Provider, MD  Vitamin D, Ergocalciferol, (DRISDOL) 50000 UNITS CAPS capsule Take 1 capsule (50,000 Units total) by mouth every 7 (seven) days. Patient taking differently: Take 50,000 Units by mouth every 7 (seven) days. Takes on Mondays 08/14/14  Yes Marrian SalvageJacquelyn S Gill, MD     No Known Allergies  Social History:  Wheelchair Bound, has Home Care During day   reports that she has quit smoking. She has quit using smokeless tobacco. She reports that she does not drink alcohol or use illicit drugs.    Family History  Problem Relation Age of Onset  . Heart disease Mother     died age 79 MI  . Hypertension Mother   . Alcohol abuse Father   . Cancer      sister, 1 brother (lung) 1 brother (colon), daughter x 2 deceased 1 died age 79 pancreatitic another died age 79 lung  . Diabetes Son   . COPD Son        Physical Exam:  GEN:  Pleasant Elderly thin 81 y.o. Caucasian female examined and in no acute distress; cooperative with exam Filed Vitals:   01/25/15 1900 01/25/15 1930 01/25/15 2015 01/25/15 2115  BP: 110/68 113/56 131/69 120/64  Pulse: 67 72 88 73  Temp:      TempSrc:      Resp:    18  SpO2: 95% 86% 98% 97%   Blood pressure 120/64, pulse 73, temperature 98.1 F (36.7 C), temperature source Oral, resp. rate 18, SpO2 97 %. PSYCH: She is alert and oriented x4; does not appear anxious does not appear depressed; affect is normal HEENT: Normocephalic and Atraumatic, Mucous membranes pink; PERRLA; EOM intact; Fundi:  Benign;  No scleral icterus, Nares: Patent, Oropharynx: Clear, Edentulous,    Neck:  FROM, No Cervical Lymphadenopathy nor Thyromegaly or Carotid Bruit; No JVD; Breasts:: Not examined CHEST WALL: No tenderness CHEST: Normal respiration, clear to auscultation bilaterally HEART: Regular rate and rhythm; no murmurs  rubs or gallops BACK: No kyphosis or scoliosis; No CVA tenderness ABDOMEN: Positive Bowel Sounds, Scaphoid, Soft Non-Tender, No Rebound or Guarding; No Masses, No Organomegaly. Rectal Exam: Not done EXTREMITIES: No Cyanosis, Clubbing, or Edema; No Ulcerations. Genitalia: not examined PULSES: 2+ and symmetric SKIN: Normal hydration no rash or ulceration CNS:  Alert and Oriented x 4, Generalized Weakness,  No Focal Deficits Vascular: pulses palpable throughout    Labs on Admission:  Basic Metabolic Panel:  Recent Labs Lab 01/25/15 1600  NA 138  K 4.0  CL 106  CO2 25  GLUCOSE 96  BUN 15  CREATININE 0.67  CALCIUM 9.4   Liver Function Tests:  Recent Labs Lab 01/25/15 1600  AST 16  ALT 14  ALKPHOS 70  BILITOT 0.5  PROT 6.0*  ALBUMIN 3.1*    Recent Labs Lab 01/25/15 1600  LIPASE 17*   No results for input(s): AMMONIA in the last 168 hours. CBC:  Recent Labs Lab 01/25/15 1600  WBC 6.5  NEUTROABS 4.1  HGB 9.9*  HCT 33.3*  MCV 80.4  PLT 238   Cardiac Enzymes:  Recent Labs Lab 01/25/15 1940  TROPONINI 0.03    BNP (last 3 results) No results for input(s): BNP in the last 8760 hours.  ProBNP (last 3 results)  Recent Labs  08/13/14 0806  PROBNP 230.3    CBG: No results for input(s): GLUCAP in the last 168 hours.  Radiological Exams on Admission: Dg Chest 2 View  01/25/2015   CLINICAL DATA:  Patient with vomiting. Feels as if food is getting stuck. Complaining of diarrhea and cough.  EXAM: CHEST  2 VIEW  COMPARISON:  Rib radiographs 08/13/2014; chest CT 08/13/2014  FINDINGS: Stable cardiac and mediastinal contours with tortuosity of the thoracic aorta. Lungs are hyperexpanded. No consolidative pulmonary opacities. No pleural effusion or pneumothorax. Mid thoracic spine degenerative changes.  IMPRESSION: No acute osseous abnormality.   Electronically Signed   By: Annia Belt M.D.   On: 01/25/2015 17:10   Ct Angio Chest Pe W/cm &/or Wo  Cm  01/25/2015   CLINICAL DATA:  Hypoxia, cough  EXAM: CT ANGIOGRAPHY CHEST WITH CONTRAST  TECHNIQUE: Multidetector CT imaging of the chest was performed using the standard protocol during bolus administration of intravenous contrast. Multiplanar CT image reconstructions and MIPs were obtained to evaluate the vascular anatomy.  CONTRAST:  OMNIPAQUE IOHEXOL 350 MG/ML SOLN  COMPARISON:  Chest radiographs dated 01/25/2015. CTA chest dated 08/13/2014.  FINDINGS: No evidence of pulmonary embolism.  Although not tailored for evaluation of the aorta, there is a stable penetrating atherosclerotic ulcer along the left lateral aspect of the aortic arch just prior to the proximal descending thoracic aorta (series 406/image 96).  Mediastinum/Nodes: The heart is top-normal in size. No pericardial effusion.  Coronary atherosclerosis.  Atherosclerotic calcifications of the aortic arch.  No suspicious mediastinal, hilar, or axillary lymphadenopathy.  Lungs/Pleura: No suspicious pulmonary nodules.  Mild centrilobular emphysematous changes.  Dependent atelectasis in the bilateral lower lobes, left greater than right.  Small left and trace right pleural effusions.  No pneumothorax.  Upper abdomen: Visualized upper abdomen is unremarkable.  Musculoskeletal: Mild superior endplate changes of two lower thoracic vertebral bodies (series 403/image 65), chronic.  Review of the MIP images confirms the above findings.  IMPRESSION: No evidence of pulmonary embolism.  Stable penetrating atherosclerotic ulcer along the left lateral aspect of the aortic arch.  Mild centrilobular emphysematous changes.  Small left and trace right pleural effusions. Associated dependent atelectasis in the bilateral lower lobes.   Electronically Signed   By: Charline Bills M.D.   On: 01/25/2015 21:07       Assessment/Plan:   81 y.o. female with  Principal Problem:   1.   Hypoxia   NCO2 PRN   Monitor O2 sats   Active Problems:   2.   Nausea  and vomiting- ? Stx   Anti-Emetic PRN   May need GI Eval for ?Stx     3.   COPD (chronic obstructive pulmonary disease)   Duonebs PRN     4.   Dysphagia   Chronic  Swallowing Study per speech therapy   May need GI Eval for ?Stx     5.   History of stroke   chronic     6.   Hypertension   Continue Lotensin    Monitor BPs     7.   Thyroid disease   Continue Levothyroxine   Check TSH    8.   DVT Prophylaxis       Lovenox     Code Status:     FULL CODE        Family Communication:   No Family Present    Disposition Plan:   Observation Status        Time spent:  54 Minutes      Ron Parker [facility] Pager [phone]   If 7AM -7PM Please Contact the Day Rounding Team MD for [facility]  If 7PM-7AM, Please Contact Night-Floor Coverage  [email or web address] Password [email or web address] 01/25/2015, 10:00 PM     ADDENDUM:   Patient was seen and examined on 01/25/2015

## 2015-01-25 NOTE — ED Provider Notes (Signed)
CSN: [id number]     Arrival date & time 01/25/15  1506 History   First MD Initiated Contact with Patient 01/25/15 1529     Chief Complaint  Patient presents with  . Fatigue  . Nausea  . Emesis  . Diarrhea     (Consider location/radiation/quality/duration/timing/severity/associated sxs/prior Treatment) HPI  81 year old female brought in by EMS with vomiting after eating for the past 1 week. The patient is also been having a productive cough for the past 1 week as well. She typically has a chronic, dry cough but this productive cough is new. Patient denies any abdominal pain states she's had diarrhea twice but is unable to keep down food despite being hungry. The patient has not had any hematemesis or hematochezia. No urinary symptoms. No chest pain. Family was unable to get in touch with the patient today and so neighbors are trying to wake her up but couldn't get into her house. The patient apparently slept for 4 hours today in the middle the day which is abnormal for her. When she initially awoke she was confused but currently now is at her normal mental baseline. She lives at home alone. The patient was noted to be very dyspneic when wheeling herself in her wheelchair, which the daughter feels like is because she is so tired and fatigued. No complaints of chest pain at this time. Has been getting chest and abdominal pain in the process of eating, goes away when she stops eating.  Past Medical History  Diagnosis Date  . Hypertension   . Stroke     with h/o dysphagia post stroke  . Thyroid disease   . Left shoulder pain     DJD  . UTI (lower urinary tract infection)     Enterobacter aerogenes  . COPD (chronic obstructive pulmonary disease)   . GERD (gastroesophageal reflux disease)   . Abnormality of gait   . Hard of hearing   . Anemia   . Depression   . Tobacco abuse   . Wheelchair bound   . Bowel obstruction   . Fracture of coccyx     history  . Shortness of breath dyspnea     Past Surgical History  Procedure Laterality Date  . Hernia repair    . Hip fracture surgery    . Joint replacement      b/l hips  . Gastrectomy      for ulcer  . Other surgical history      hysterectomy   . Other surgical history    . Cataract extraction    . Abdominal hysterectomy     Family History  Problem Relation Age of Onset  . Heart disease Mother     died age 47 MI  . Hypertension Mother   . Alcohol abuse Father   . Cancer      sister, 1 brother (lung) 1 brother (colon), daughter x 2 deceased 1 died age 24 pancreatitic another died age 24 lung  . Diabetes Son   . COPD Son    History  Substance Use Topics  . Smoking status: Former Games developer  . Smokeless tobacco: Former Neurosurgeon  . Alcohol Use: No   OB History    No data available     Review of Systems  Constitutional: Negative for fever.  Respiratory: Positive for cough and shortness of breath.   Cardiovascular: Negative for chest pain and leg swelling.  Gastrointestinal: Positive for nausea, vomiting and diarrhea. Negative for abdominal pain and blood in stool.  Genitourinary: Negative for dysuria.  All other systems reviewed and are negative.     Allergies  Review of patient's allergies indicates no known allergies.  Home Medications   Prior to Admission medications   Medication Sig Start Date End Date Taking? Authorizing Provider  acetaminophen (TYLENOL) 325 MG tablet Take 650 mg by mouth every 6 (six) hours as needed for mild pain.    Historical Provider, MD  alendronate (FOSAMAX) 70 MG tablet Take 70 mg by mouth once a week.  07/31/14   Historical Provider, MD  aspirin EC 81 MG tablet Take 81 mg by mouth daily.    Historical Provider, MD  benazepril (LOTENSIN) 10 MG tablet Take 5 mg by mouth daily.  02/28/14   Historical Provider, MD  calcium carbonate (OS-CAL - DOSED IN MG OF ELEMENTAL CALCIUM) 1250 MG tablet Take 1 tablet (500 mg of elemental calcium total) by mouth 2 (two) times daily with a meal.  08/14/14   Marrian Salvage, MD  escitalopram (LEXAPRO) 5 MG tablet Take 5 mg by mouth daily.    Historical Provider, MD  ipratropium-albuterol (DUONEB) 0.5-2.5 (3) MG/3ML SOLN Inhale 3 mLs into the lungs daily.  07/25/14   Historical Provider, MD  levothyroxine (SYNTHROID, LEVOTHROID) 25 MCG tablet Take 25 mcg by mouth daily before breakfast.    Historical Provider, MD  nicotine (NICODERM CQ - DOSED IN MG/24 HOURS) 21 mg/24hr patch Place 1 patch (21 mg total) onto the skin daily. 08/14/14   Marrian Salvage, MD  omeprazole (PRILOSEC) 40 MG capsule Take 40 mg by mouth daily.    Historical Provider, MD  traMADol (ULTRAM) 50 MG tablet Take 50 mg by mouth every 6 (six) hours as needed (for back pain).    Historical Provider, MD  Vitamin D, Ergocalciferol, (DRISDOL) 50000 UNITS CAPS capsule Take 1 capsule (50,000 Units total) by mouth every 7 (seven) days. 08/14/14   Marrian Salvage, MD   BP 132/76 mmHg  Pulse 76  Temp(Src) 98.1 F (36.7 C) (Oral)  Resp 16  SpO2 94% Physical Exam  Constitutional: She is oriented to person, place, and time. She appears cachectic.  HENT:  Head: Normocephalic and atraumatic.  Right Ear: External ear normal.  Left Ear: External ear normal.  Nose: Nose normal.  Eyes: Right eye exhibits no discharge. Left eye exhibits no discharge.  Cardiovascular: Normal rate, regular rhythm and normal heart sounds.   Pulmonary/Chest: Effort normal and breath sounds normal.  Abdominal: Soft. She exhibits no distension. There is no tenderness.  Neurological: She is alert and oriented to person, place, and time.  Skin: Skin is warm and dry.  Vitals reviewed.   ED Course  Procedures (including critical care time) Labs Review Labs Reviewed  COMPREHENSIVE METABOLIC PANEL - Abnormal; Notable for the following:    Total Protein 6.0 (*)    Albumin 3.1 (*)    All other components within normal limits  LIPASE, BLOOD - Abnormal; Notable for the following:    Lipase 17 (*)    All  other components within normal limits  CBC WITH DIFFERENTIAL/PLATELET - Abnormal; Notable for the following:    Hemoglobin 9.9 (*)    HCT 33.3 (*)    MCH 23.9 (*)    MCHC 29.7 (*)    All other components within normal limits  URINALYSIS, ROUTINE W REFLEX MICROSCOPIC - Abnormal; Notable for the following:    APPearance CLOUDY (*)    All other components within normal limits  I-STAT ARTERIAL BLOOD GAS,  ED - Abnormal; Notable for the following:    pH, Arterial 7.320 (*)    pCO2 arterial 49.6 (*)    Bicarbonate 25.5 (*)    All other components within normal limits  TROPONIN I    Imaging Review Dg Chest 2 View  01/25/2015   CLINICAL DATA:  Patient with vomiting. Feels as if food is getting stuck. Complaining of diarrhea and cough.  EXAM: CHEST  2 VIEW  COMPARISON:  Rib radiographs 08/13/2014; chest CT 08/13/2014  FINDINGS: Stable cardiac and mediastinal contours with tortuosity of the thoracic aorta. Lungs are hyperexpanded. No consolidative pulmonary opacities. No pleural effusion or pneumothorax. Mid thoracic spine degenerative changes.  IMPRESSION: No acute osseous abnormality.   Electronically Signed   By: Annia Beltrew  Davis M.D.   On: 01/25/2015 17:10   Ct Angio Chest Pe W/cm &/or Wo Cm  01/25/2015   CLINICAL DATA:  Hypoxia, cough  EXAM: CT ANGIOGRAPHY CHEST WITH CONTRAST  TECHNIQUE: Multidetector CT imaging of the chest was performed using the standard protocol during bolus administration of intravenous contrast. Multiplanar CT image reconstructions and MIPs were obtained to evaluate the vascular anatomy.  CONTRAST:  100mL OMNIPAQUE IOHEXOL 350 MG/ML SOLN  COMPARISON:  Chest radiographs dated 01/25/2015. CTA chest dated 08/13/2014.  FINDINGS: No evidence of pulmonary embolism.  Although not tailored for evaluation of the aorta, there is a stable penetrating atherosclerotic ulcer along the left lateral aspect of the aortic arch just prior to the proximal descending thoracic aorta (series 406/image  96).  Mediastinum/Nodes: The heart is top-normal in size. No pericardial effusion.  Coronary atherosclerosis.  Atherosclerotic calcifications of the aortic arch.  No suspicious mediastinal, hilar, or axillary lymphadenopathy.  Lungs/Pleura: No suspicious pulmonary nodules.  Mild centrilobular emphysematous changes.  Dependent atelectasis in the bilateral lower lobes, left greater than right.  Small left and trace right pleural effusions.  No pneumothorax.  Upper abdomen: Visualized upper abdomen is unremarkable.  Musculoskeletal: Mild superior endplate changes of two lower thoracic vertebral bodies (series 403/image 65), chronic.  Review of the MIP images confirms the above findings.  IMPRESSION: No evidence of pulmonary embolism.  Stable penetrating atherosclerotic ulcer along the left lateral aspect of the aortic arch.  Mild centrilobular emphysematous changes.  Small left and trace right pleural effusions. Associated dependent atelectasis in the bilateral lower lobes.   Electronically Signed   By: Charline BillsSriyesh  Krishnan M.D.   On: 01/25/2015 21:07     EKG Interpretation None      MDM   Final diagnoses:  Hypoxia    Patient is well-appearing here is able to tolerate oral fluids and crackers after Zofran. Given IV fluid hydration. Exam is unremarkable but it was noted that patient is intermittently hypoxic, Lois down to 82% with a good waveform. The patient has a history of COPD and this is likely contributory as she has no symptoms of shortness of breath currently. No chest pain or leg swelling. Given the intermittent hypoxia CT scan was obtained and shows no significant cause and no pulmonary embolism. However given that she is currently requiring oxygen by nasal cannula she will need to be admitted and likely discharge home on oxygen.    Pricilla LovelessScott Shy Guallpa, MD 01/25/15 2255

## 2015-01-25 NOTE — ED Notes (Signed)
Pt taken off of the oxygen for monitoring.  SpO2 down to 82% with good pleth.  MD made aware.    Pt put back on 2 liters of O2 through [place]

## 2015-01-25 NOTE — ED Notes (Signed)
Dressing for IV changed to clean up blood under dressing.  IV running well, flushes easily.

## 2015-01-25 NOTE — ED Notes (Signed)
Family at bedside at this time.  Sts pt has been coughing a lot and working harder to breathe, and sts pt will having coughing fits and cough up several tablespoons of white/frothy sputum.

## 2015-01-25 NOTE — ED Notes (Signed)
Per EMS: Pt's family called EMS when they were unable to get the pt to answer the door.  Pt sts she has been sleeping.  Pt c/o throwing up after eating x3-4 days, feeling like food is "getting stuck".  Pt also c/o diarrhea and a cough with greenish/yellowish sputum.  Pt non-ambulatory due to bilateral hip fractures and live at home alone with 8hrs of home care per day.  Pt alert and oriented, NAD

## 2015-01-25 NOTE — ED Notes (Signed)
  Pt o2 stats 88%, placed pt on 2L o2

## 2015-01-25 NOTE — ED Notes (Signed)
April CollaJerry Mccoy and April FieldJudy Mccoy (daughter) would like to be contacted with updates on the pt.    [phone]

## 2015-01-25 NOTE — ED Notes (Signed)
Sat pt upright in bed, better positioning for oxygenation.  Bed re-adjusted to prevent slippage.  Pt removed from oxygen, EMT in room collecting blood and will monitor O2 saturations

## 2015-01-25 NOTE — ED Notes (Signed)
Pt placed on monitor, cont. bp and pulse ox. monitoring

## 2015-01-25 NOTE — ED Notes (Signed)
CT called to complete CT angio

## 2015-01-25 NOTE — ED Notes (Signed)
Pt sts she just urinated.  Will check back with female urinal after fluids have run through some.

## 2015-01-25 NOTE — ED Notes (Signed)
Spoke to CT.  Will take current IV for pt scan

## 2015-01-26 DIAGNOSIS — J449 Chronic obstructive pulmonary disease, unspecified: Secondary | ICD-10-CM | POA: Diagnosis not present

## 2015-01-26 DIAGNOSIS — K2210 Ulcer of esophagus without bleeding: Secondary | ICD-10-CM | POA: Diagnosis not present

## 2015-01-26 DIAGNOSIS — I10 Essential (primary) hypertension: Secondary | ICD-10-CM | POA: Diagnosis not present

## 2015-01-26 DIAGNOSIS — Z8673 Personal history of transient ischemic attack (TIA), and cerebral infarction without residual deficits: Secondary | ICD-10-CM | POA: Diagnosis not present

## 2015-01-26 DIAGNOSIS — R0902 Hypoxemia: Secondary | ICD-10-CM | POA: Diagnosis not present

## 2015-01-26 DIAGNOSIS — R1310 Dysphagia, unspecified: Secondary | ICD-10-CM | POA: Diagnosis not present

## 2015-01-26 LAB — BASIC METABOLIC PANEL
Anion gap: 4 — ABNORMAL LOW (ref 5–15)
BUN: 13 mg/dL (ref 6–20)
CHLORIDE: 107 mmol/L (ref 101–111)
CO2: 28 mmol/L (ref 22–32)
CREATININE: 0.73 mg/dL (ref 0.44–1.00)
Calcium: 9.1 mg/dL (ref 8.9–10.3)
GFR calc Af Amer: >60 mL/min (ref >60)
Glucose, Bld: 80 mg/dL (ref 65–99)
POTASSIUM: 3.9 mmol/L (ref 3.5–5.1)
Sodium: 139 mmol/L (ref 135–145)

## 2015-01-26 LAB — TSH: TSH: 1.929 u[IU]/mL (ref 0.350–4.500)

## 2015-01-26 LAB — RETICULOCYTES
RBC.: 3.94 MIL/uL (ref 3.87–5.11)
Retic Count, Absolute: 39.4 10*3/uL (ref 19.0–186.0)
Retic Ct Pct: 1 % (ref 0.4–3.1)

## 2015-01-26 LAB — FOLATE: FOLATE: 33 ng/mL (ref >5.9)

## 2015-01-26 LAB — IRON AND TIBC
IRON: 27 ug/dL — AB (ref 28–170)
Saturation Ratios: 7 % — ABNORMAL LOW (ref 10.4–31.8)
TIBC: 413 ug/dL (ref 250–450)
UIBC: 386 ug/dL

## 2015-01-26 LAB — CBC
HCT: 32.2 % — ABNORMAL LOW (ref 36.0–46.0)
Hemoglobin: 9.4 g/dL — ABNORMAL LOW (ref 12.0–15.0)
MCH: 23.9 pg — ABNORMAL LOW (ref 26.0–34.0)
MCHC: 29.2 g/dL — ABNORMAL LOW (ref 30.0–36.0)
MCV: 81.7 fL (ref 78.0–100.0)
Platelets: 231 10*3/uL (ref 150–400)
RBC: 3.94 MIL/uL (ref 3.87–5.11)
RDW: 14.1 % (ref 11.5–15.5)
WBC: 5.2 10*3/uL (ref 4.0–10.5)

## 2015-01-26 LAB — VITAMIN B12: Vitamin B-12: 261 pg/mL (ref 180–914)

## 2015-01-26 LAB — FERRITIN: Ferritin: 8 ng/mL — ABNORMAL LOW (ref 11–307)

## 2015-01-26 MED ORDER — ALBUTEROL SULFATE (2.5 MG/3ML) 0.083% IN NEBU: 2.5000 mg | INHALATION_SOLUTION | Freq: Four times a day (QID) | RESPIRATORY_TRACT | Status: DC | PRN

## 2015-01-26 MED ORDER — IPRATROPIUM-ALBUTEROL 0.5-2.5 (3) MG/3ML IN SOLN
3.0000 mL | Freq: Every day | RESPIRATORY_TRACT | Status: DC
  Administered 2015-01-28 – 2015-01-29 (×2): 3 mL via RESPIRATORY_TRACT
  Filled 2015-01-26 (×2): qty 3

## 2015-01-26 MED ORDER — CETYLPYRIDINIUM CHLORIDE 0.05 % MT LIQD
7.0000 mL | Freq: Two times a day (BID) | OROMUCOSAL | Status: DC
  Administered 2015-01-26 – 2015-01-29 (×5): 7 mL via OROMUCOSAL

## 2015-01-26 MED ORDER — POLYSACCHARIDE IRON COMPLEX 150 MG PO CAPS
150.0000 mg | ORAL_CAPSULE | Freq: Every day | ORAL | Status: DC
  Administered 2015-01-26 – 2015-01-29 (×4): 150 mg via ORAL
  Filled 2015-01-26 (×4): qty 1

## 2015-01-26 NOTE — Progress Notes (Addendum)
SLP Cancellation Note  Patient Details Name: April Mccoy MRN: [id number] DOB: 02/24/1929   Cancelled treatment:       Reason Eval/Treat Not Completed: Other (comment): Acknowledge MBS order. Will complete Monday, 5/23.   [place], Amy K, MA, CCC-SLP 01/26/2015, 9:42 AM

## 2015-01-26 NOTE — Progress Notes (Signed)
Patient respiratory assessment done and patient scored a 7.Patienthome regimen is Qd Duo neb, changed to Duo QD and added PRn albuterol. Patient has BBS Clear with Diminished in the bases. X ray looked clear just Hyperexpansion. Patient has some congestion in upper throat and airway but no wheezes. Will reassess Per Protocol and PRN.

## 2015-01-26 NOTE — Progress Notes (Signed)
[facility] PROGRESS NOTE  April Mccoy ZOX:[id number] DOB: 27-Aug-1929 DOA: 01/25/2015  PCP: Ailene Ravel, MD  Brief HPI: 81 year old Caucasian female with a past medical history of COPD, hypertension, history of stroke, presented with 3-4 day history of nausea and vomiting. In the emergency department she was found to be hypoxic, saturating about 82% on room air. CT angiogram was negative for PE. She also mentioned difficulty with swallowing and feeling of food getting stuck in her chest. She was admitted for further management.  Past medical history:  Past Medical History  Diagnosis Date  . Hypertension   . Stroke     with h/o dysphagia post stroke  . Thyroid disease   . Left shoulder pain     DJD  . UTI (lower urinary tract infection)     Enterobacter aerogenes  . COPD (chronic obstructive pulmonary disease)   . GERD (gastroesophageal reflux disease)   . Abnormality of gait   . Hard of hearing   . Anemia   . Depression   . Tobacco abuse   . Wheelchair bound   . Bowel obstruction   . Fracture of coccyx     history  . Shortness of breath dyspnea     Consultants: None  Procedures: None  Antibiotics: None  Subjective: Patient feels better this morning. I was informed by the nurse that she did have some difficulty swallowing her tablets, but was able to swallow the applesauce without any difficulty. Some cough was also noted.  Objective: Vital Signs  Filed Vitals:   01/25/15 2115 01/25/15 2317 01/26/15 0302 01/26/15 0347  BP: 120/64 114/53  125/66  Pulse: 73 72 72 72  Temp:  97.5 F (36.4 C)  98 F (36.7 C)  TempSrc:  Oral  Oral  Resp: Height:   (1.575 m)    Weight:  46.448 kg (102 lb 6.4 oz)    SpO2: 97% 99% 99% 99%   No intake or output data in the 24 hours ending 01/26/15 0731 Filed Weights   01/25/15 2317  Weight: 46.448 kg (102 lb 6.4 oz)    General appearance: alert, cooperative, appears stated age and no  distress Resp: Coarse breath sounds bilaterally. No wheezing. No rhonchi. No crackles. Cardio: regular rate and rhythm, S1, S2 normal, no murmur, click, rub or gallop GI: soft, non-tender; bowel sounds normal; no masses,  no organomegaly Extremities: extremities normal, atraumatic, no cyanosis or edema Pulses: 2+ and symmetric Neurologic: She is awake, alert. Oriented 3. No focal neurological deficits are noted.  Lab Results:  Basic Metabolic Panel:  Recent Labs Lab 01/25/15 1600  NA 138  K 4.0  CL 106  CO2 25  GLUCOSE 96  BUN 15  CREATININE 0.67  CALCIUM 9.4   Liver Function Tests:  Recent Labs Lab 01/25/15 1600  AST 16  ALT 14  ALKPHOS 70  BILITOT 0.5  PROT 6.0*  ALBUMIN 3.1*    Recent Labs Lab 01/25/15 1600  LIPASE 17*   CBC:  Recent Labs Lab 01/25/15 1600 01/26/15 0550  WBC 6.5 5.2  NEUTROABS 4.1  --   HGB 9.9* 9.4*  HCT 33.3* 32.2*  MCV 80.4 81.7  PLT 238 231   Cardiac Enzymes:  Recent Labs Lab 01/25/15 1940  TROPONINI 0.03     Studies/Results: Dg Chest 2 View  01/25/2015   CLINICAL DATA:  Patient with vomiting. Feels as if food is getting stuck. Complaining of diarrhea and cough.  EXAM:  CHEST  2 VIEW  COMPARISON:  Rib radiographs 08/13/2014; chest CT 08/13/2014  FINDINGS: Stable cardiac and mediastinal contours with tortuosity of the thoracic aorta. Lungs are hyperexpanded. No consolidative pulmonary opacities. No pleural effusion or pneumothorax. Mid thoracic spine degenerative changes.  IMPRESSION: No acute osseous abnormality.   Electronically Signed   By: Annia Belt M.D.   On: 01/25/2015 17:10   Ct Angio Chest Pe W/cm &/or Wo Cm  01/25/2015   CLINICAL DATA:  Hypoxia, cough  EXAM: CT ANGIOGRAPHY CHEST WITH CONTRAST  TECHNIQUE: Multidetector CT imaging of the chest was performed using the standard protocol during bolus administration of intravenous contrast. Multiplanar CT image reconstructions and MIPs were obtained to evaluate the  vascular anatomy.  CONTRAST:  OMNIPAQUE IOHEXOL 350 MG/ML SOLN  COMPARISON:  Chest radiographs dated 01/25/2015. CTA chest dated 08/13/2014.  FINDINGS: No evidence of pulmonary embolism.  Although not tailored for evaluation of the aorta, there is a stable penetrating atherosclerotic ulcer along the left lateral aspect of the aortic arch just prior to the proximal descending thoracic aorta (series 406/image 96).  Mediastinum/Nodes: The heart is top-normal in size. No pericardial effusion.  Coronary atherosclerosis.  Atherosclerotic calcifications of the aortic arch.  No suspicious mediastinal, hilar, or axillary lymphadenopathy.  Lungs/Pleura: No suspicious pulmonary nodules.  Mild centrilobular emphysematous changes.  Dependent atelectasis in the bilateral lower lobes, left greater than right.  Small left and trace right pleural effusions.  No pneumothorax.  Upper abdomen: Visualized upper abdomen is unremarkable.  Musculoskeletal: Mild superior endplate changes of two lower thoracic vertebral bodies (series 403/image 65), chronic.  Review of the MIP images confirms the above findings.  IMPRESSION: No evidence of pulmonary embolism.  Stable penetrating atherosclerotic ulcer along the left lateral aspect of the aortic arch.  Mild centrilobular emphysematous changes.  Small left and trace right pleural effusions. Associated dependent atelectasis in the bilateral lower lobes.   Electronically Signed   By: Charline Bills M.D.   On: 01/25/2015 21:07    Medications:  Scheduled: . antiseptic oral rinse  7 mL Mouth Rinse BID  . aspirin EC  81 mg Oral Daily  . benazepril  5 mg Oral Daily  . calcium carbonate  1 tablet Oral BID WC  . enoxaparin (LOVENOX) injection  30 mg Subcutaneous Q24H  . escitalopram  5 mg Oral Daily  . [START ON 01/27/2015] ipratropium-albuterol  3 mL Nebulization Daily  . iron polysaccharides  150 mg Oral Daily  . levothyroxine  25 mcg Oral QAC breakfast  . pantoprazole  40 mg  Oral Daily  . sodium chloride  3 mL Intravenous Q12H  . sodium chloride  3 mL Intravenous Q12H   Continuous:  ZOX:WRUEAV chloride, acetaminophen **OR** acetaminophen, albuterol, alum & mag hydroxide-simeth, HYDROmorphone (DILAUDID) injection, ondansetron **OR** ondansetron (ZOFRAN) IV, oxyCODONE, sodium chloride  Assessment/Plan:  Principal Problem:   Hypoxia Active Problems:   History of stroke   Hypertension   Thyroid disease   COPD (chronic obstructive pulmonary disease)   Dysphagia   Tobacco abuse   Nausea and vomiting   SOB (shortness of breath)   Hypoxemia    Acute respiratory failure with hypoxia CT chest was negative for PE. COPD is likely responsible for her hypoxia. Aspiration is also possible, though no infiltrates were noted on imaging films. She will likely need to be on home oxygen. Continue to monitor for now.  Dysphagia. She does have a known history of oropharyngeal dysphagia. She has been evaluated by speech therapy  in the past. She underwent a swallow evaluation in December 2015. She was placed on a dysphagia 3 diet with nectar thick liquids. We will change her diet to the same. She also mentions that the food sometimes gets stuck in her chest. We will get a barium swallow to further evaluate this issue. If the barium swallow is remarkably abnormal, she may need a GI consultation. Speech therapy evaluation is also pending.  History of COPD Does not appear to have any exacerbation at this time. Continue current treatment  History of stroke Stable. Continue aspirin. Apparently she has impaired mobility at baseline. She did tell me that she sometimes gets up with a walker. We will involve PT and OT.  History of essential hypertension. Continue with her antihypertensive regimen  History of hypothyroidism Continue levothyroxin.  DVT Prophylaxis: Lovenox    Code Status: Full code  Family Communication: Discussed with the patient. No family at bedside   Disposition Plan: Await swallow evaluation. Await p.m. swallow. PT and OT to see.    [facility]KRISHNAN,Elianah Karis  [facility] Pager [phone] 01/26/2015, 7:31 AM  If 7PM-7AM, please contact night-coverage at [email or web address], password [email or web address]

## 2015-01-27 ENCOUNTER — Encounter (HOSPITAL_COMMUNITY): Payer: Self-pay | Admitting: Physician Assistant

## 2015-01-27 ENCOUNTER — Observation Stay (HOSPITAL_COMMUNITY): Payer: Medicare Other

## 2015-01-27 DIAGNOSIS — K224 Dyskinesia of esophagus: Secondary | ICD-10-CM | POA: Diagnosis not present

## 2015-01-27 DIAGNOSIS — K219 Gastro-esophageal reflux disease without esophagitis: Secondary | ICD-10-CM | POA: Diagnosis not present

## 2015-01-27 DIAGNOSIS — K2210 Ulcer of esophagus without bleeding: Secondary | ICD-10-CM | POA: Diagnosis not present

## 2015-01-27 DIAGNOSIS — R933 Abnormal findings on diagnostic imaging of other parts of digestive tract: Secondary | ICD-10-CM

## 2015-01-27 DIAGNOSIS — I10 Essential (primary) hypertension: Secondary | ICD-10-CM | POA: Diagnosis not present

## 2015-01-27 DIAGNOSIS — J449 Chronic obstructive pulmonary disease, unspecified: Secondary | ICD-10-CM | POA: Diagnosis not present

## 2015-01-27 DIAGNOSIS — R1310 Dysphagia, unspecified: Secondary | ICD-10-CM | POA: Diagnosis not present

## 2015-01-27 DIAGNOSIS — Z8673 Personal history of transient ischemic attack (TIA), and cerebral infarction without residual deficits: Secondary | ICD-10-CM | POA: Diagnosis not present

## 2015-01-27 DIAGNOSIS — R0902 Hypoxemia: Secondary | ICD-10-CM | POA: Diagnosis not present

## 2015-01-27 LAB — CBC
HEMATOCRIT: 30.3 % — AB (ref 36.0–46.0)
HEMOGLOBIN: 9 g/dL — AB (ref 12.0–15.0)
MCH: 24.3 pg — AB (ref 26.0–34.0)
MCHC: 29.7 g/dL — AB (ref 30.0–36.0)
MCV: 81.9 fL (ref 78.0–100.0)
Platelets: 226 10*3/uL (ref 150–400)
RBC: 3.7 MIL/uL — ABNORMAL LOW (ref 3.87–5.11)
RDW: 14 % (ref 11.5–15.5)
WBC: 5.9 10*3/uL (ref 4.0–10.5)

## 2015-01-27 LAB — BASIC METABOLIC PANEL
Anion gap: 5 (ref 5–15)
BUN: 15 mg/dL (ref 6–20)
CO2: 29 mmol/L (ref 22–32)
Calcium: 9 mg/dL (ref 8.9–10.3)
Chloride: 103 mmol/L (ref 101–111)
Creatinine, Ser: 0.65 mg/dL (ref 0.44–1.00)
GFR calc Af Amer: >60 mL/min (ref >60)
Glucose, Bld: 83 mg/dL (ref 65–99)
Potassium: 3.7 mmol/L (ref 3.5–5.1)
SODIUM: 137 mmol/L (ref 135–145)

## 2015-01-27 MED ORDER — SODIUM CHLORIDE 0.9 % IV SOLN
INTRAVENOUS | Status: DC
  Administered 2015-01-27: 18:00:00 via INTRAVENOUS
  Administered 2015-01-28: 20 mL via INTRAVENOUS

## 2015-01-27 MED ORDER — RESOURCE THICKENUP CLEAR PO POWD
ORAL | Status: DC | PRN
  Filled 2015-01-27: qty 125

## 2015-01-27 NOTE — Evaluation (Addendum)
Occupational Therapy Evaluation Patient Details Name: April Mccoy MRN: [id number] DOB: November 07, 1928 Today's Date: 01/27/2015    History of Present Illness Admitted with nausea/vomiting and SOB. COPD exacerbation and found to aspirate thin liquids PMHx- COPD, HTN CVA    Clinical Impression   Pt admitted with above. Daughter assisting pt with ADLs, PTA. Feel pt will benefit from acute OT to increase strength and independence prior to d/c.   Follow Up Recommendations  Home health OT;Supervision/Assistance - 24 hour    Equipment Recommendations  Other (comment) (1/2 lap tray for wheelchair)    Recommendations for Other Services       Precautions / Restrictions Precautions Precautions: Fall Restrictions Weight Bearing Restrictions: No      Mobility Bed Mobility Overal bed mobility: Needs Assistance Bed Mobility: Supine to Sit  Supine to sit: Supervision     General bed mobility comments: cues to scoot out until feet touch floor. No physical assist needed.  Transfers Overall transfer level: Needs assistance Equipment used: Rolling walker (2 wheeled) Transfers: Sit to/from [employer] Sit to Stand: Min assist;Mod assist Stand/Squat pivot transfers: Min assist;Mod assist       General transfer comment: varying levels of assist for transfers Instructed pt to scoot out to edge before standing. Pt with uncontrolled descent to chair on first transfer to chair.     Balance Balance not formally assessed-pt unsteady with transfers.                             ADL Overall ADL's : Needs assistance/impaired Eating/Feeding: Sitting;Independent (pt coughing when OT arrived; able to drink coke-no problem)           Lower Body Bathing: Minimal assistance;Sitting/lateral leans   Upper Body Dressing : Set up;Sitting   Lower Body Dressing: Sitting/lateral leans;Minimal assistance   Toilet Transfer: Minimal assistance;Moderate  assistance;Stand/Squat pivot transfers and sit <> stand (bed <> chair)        Functional mobility during ADLs: Minimal assistance;Moderate assistance (stand/squat pivot; bed <> chair) General ADL Comments: Pt practiced pivoting in session as she transfers to chair at home from her wheelchair. Talked with daughter about recommending HH and 24/7 assist at d/c.     Vision     Perception     Praxis      Pertinent Vitals/Pain Pain Assessment: No/denies pain     Hand Dominance     Extremity/Trunk Assessment Upper Extremity Assessment Upper Extremity Assessment:  (weakness in both shoulders)   Lower Extremity Assessment Lower Extremity Assessment: Defer to PT evaluation RLE Deficits / Details: knee extension 3+ (buckling with fatigue) LLE Deficits / Details: knee extension 4/5     Communication Communication Communication: HOH;Expressive difficulties   Cognition Arousal/Alertness: Awake/alert Behavior During Therapy: WFL for tasks assessed/performed Overall Cognitive Status: Difficult to assess due to [facility]                     General Comments          Shoulder Instructions      Home Living Family/patient expects to be discharged to:: Private residence Living Arrangements: Alone Available Help at Discharge: Family Type of Home: Apartment Home Access: Level entry     Home Layout: One level         Firefighter:  (has 3 in 1 over commode) Bathroom Accessibility: Yes How Accessible: Accessible via wheelchair Home Equipment: Wheelchair - manual;Shower seat;Bedside commode;Walker - 2 wheels;Grab  bars - toilet          Prior Functioning/Environment Level of Independence: Needs assistance  Gait / Transfers Assistance Needed: uses w/c for daily mobility; walks only when someone is there to help ADL's / Homemaking Assistance Needed: daughter assist with bathing, dressing and meal prep (pt only has to put food into microwave to reheat); assists with shower  transfer; wears depends at home.   Comments: Spoke with daughter, she is present from 6am-2pm daily; pt uses w/c when home alone and does squat-pivot w/c to recliner, and is asleep in recliner by 7pm.     OT Diagnosis: Generalized weakness   OT Problem List: Decreased strength;Decreased activity tolerance;Decreased knowledge of precautions;Decreased knowledge of use of DME or AE;Impaired balance (sitting and/or standing)   OT Treatment/Interventions: Self-care/ADL training;DME and/or AE instruction;Therapeutic exercise;Energy conservation;Therapeutic activities;Patient/family education;Balance training    OT Goals(Current goals can be found in the care plan section) Acute Rehab OT Goals Patient Stated Goal: daughter wants pt to go home OT Goal Formulation: With patient/family Time For Goal Achievement: 02/03/15 Potential to Achieve Goals: Good ADL Goals Pt Will Perform Lower Body Dressing: with set-up;sitting/lateral leans;with caregiver independent in assisting Pt Will Transfer to Toilet: with modified independence;stand pivot transfer (3 in 1 over toilet) Pt Will Perform Toileting - Clothing Manipulation and hygiene: with modified independence;sitting/lateral leans  OT Frequency: Min 2X/week   Barriers to D/C:            Co-evaluation              End of Session Equipment Utilized During Treatment: Gait belt;Oxygen Nurse Communication: Mobility status  Activity Tolerance: Patient tolerated treatment well Patient left: in chair;with family/visitor present   Time: 1431-1451 OT Time Calculation (min): 20 min Charges:  OT General Charges $OT Visit: 1 Procedure OT Evaluation $Initial OT Evaluation Tier I: 1 Procedure G-Codes: OT G-codes **NOT FOR INPATIENT CLASS** Functional Assessment Tool Used: clinical judgment Functional Limitation: Self care Self Care Current Status (Z6109(G8987): At least 20 percent but less than 40 percent impaired, limited or restricted Self Care  Goal Status (U0454(G8988): At least 1 percent but less than 20 percent impaired, limited or restricted  Earlie RavelingStraub, Yatziri Wainwright L OTR/L [phone] 01/27/2015, 3:16 PM

## 2015-01-27 NOTE — Progress Notes (Signed)
[facility] PROGRESS NOTE  April Mccoy ZOX:[id number] DOB: [birth year] DOA: 01/25/2015  PCP: Ailene Ravel, MD  Brief HPI: 81 year old Caucasian female with a past medical history of COPD, hypertension, history of stroke, presented with 3-4 day history of nausea and vomiting. In the emergency department she was found to be hypoxic, saturating about 82% on room air. CT angiogram was negative for PE. She also mentioned difficulty with swallowing and feeling of food getting stuck in her chest. She was admitted for further management. Barium swallow was done which shows narrowing at the EG junction. Gastroenterology has been consulted.  Past medical history:  Past Medical History  Diagnosis Date  . Hypertension   . Stroke     with h/o dysphagia post stroke  . Thyroid disease   . Left shoulder pain     DJD  . UTI (lower urinary tract infection)     Enterobacter aerogenes  . COPD (chronic obstructive pulmonary disease)   . GERD (gastroesophageal reflux disease)   . Abnormality of gait   . Hard of hearing   . Anemia   . Depression   . Tobacco abuse   . Wheelchair bound   . Bowel obstruction   . Fracture of coccyx     history  . Shortness of breath dyspnea     Consultants: [facility]  Procedures: None yet  Antibiotics: None  Subjective: Patient continues to have difficulty swallowing. She feels as if the food is getting stuck in her chest. Cough is better. No further nausea or vomiting.   Objective: Vital Signs  Filed Vitals:   01/26/15 0347 01/26/15 1329 01/26/15 2108 01/27/15 0627  BP: 125/66 116/51 115/64 132/67  Pulse: 72 85 78 69  Temp: 98 F (36.7 C) 97.7 F (36.5 C) 98.5 F (36.9 C) 98.3 F (36.8 C)  TempSrc: Oral Oral Oral Oral  Resp: Height:      Weight:      SpO2: 99% 95% 98% 98%    Intake/Output Summary (Last 24 hours) at 01/27/15 0735 Last data filed at 01/26/15 2219  Gross per 24 hour  Intake    243 ml  Output       0 ml  Net    243 ml   Filed Weights   01/25/15 2317  Weight: 46.448 kg (102 lb 6.4 oz)    General appearance: alert, cooperative, appears stated age and no distress Resp: Coarse breath sounds bilaterally. No wheezing. No rhonchi. No crackles. Cardio: regular rate and rhythm, S1, S2 normal, no murmur, click, rub or gallop GI: soft, non-tender; bowel sounds normal; no masses,  no organomegaly Extremities: extremities normal, atraumatic, no cyanosis or edema Neurologic: She is awake, alert. Oriented 3. No focal neurological deficits are noted.  Lab Results:  Basic Metabolic Panel:  Recent Labs Lab 01/25/15 1600 01/26/15 0550 01/27/15 0425  NA 138 139 137  K 4.0 3.9 3.7  CL 106 107 103  CO2 GLUCOSE 96 80 83  BUN CREATININE 0.67 0.73 0.65  CALCIUM 9.4 9.1 9.0   Liver Function Tests:  Recent Labs Lab 01/25/15 1600  AST 16  ALT 14  ALKPHOS 70  BILITOT 0.5  PROT 6.0*  ALBUMIN 3.1*    Recent Labs Lab 01/25/15 1600  LIPASE 17*   CBC:  Recent Labs Lab 01/25/15 1600 01/26/15 0550 01/27/15 0425  WBC 6.5 5.2 5.9  NEUTROABS 4.1  --   --  HGB 9.9* 9.4* 9.0*  HCT 33.3* 32.2* 30.3*  MCV 80.4 81.7 81.9  PLT 238 231 226   Cardiac Enzymes:  Recent Labs Lab 01/25/15 1940  TROPONINI 0.03     Studies/Results: Dg Chest 2 View  01/25/2015   CLINICAL DATA:  Patient with vomiting. Feels as if food is getting stuck. Complaining of diarrhea and cough.  EXAM: CHEST  2 VIEW  COMPARISON:  Rib radiographs 08/13/2014; chest CT 08/13/2014  FINDINGS: Stable cardiac and mediastinal contours with tortuosity of the thoracic aorta. Lungs are hyperexpanded. No consolidative pulmonary opacities. No pleural effusion or pneumothorax. Mid thoracic spine degenerative changes.  IMPRESSION: No acute osseous abnormality.   Electronically Signed   By: Annia Belt M.D.   On: 01/25/2015 17:10   Ct Angio Chest Pe W/cm &/or Wo Cm  01/25/2015   CLINICAL DATA:  Hypoxia,  cough  EXAM: CT ANGIOGRAPHY CHEST WITH CONTRAST  TECHNIQUE: Multidetector CT imaging of the chest was performed using the standard protocol during bolus administration of intravenous contrast. Multiplanar CT image reconstructions and MIPs were obtained to evaluate the vascular anatomy.  CONTRAST:  OMNIPAQUE IOHEXOL 350 MG/ML SOLN  COMPARISON:  Chest radiographs dated 01/25/2015. CTA chest dated 08/13/2014.  FINDINGS: No evidence of pulmonary embolism.  Although not tailored for evaluation of the aorta, there is a stable penetrating atherosclerotic ulcer along the left lateral aspect of the aortic arch just prior to the proximal descending thoracic aorta (series 406/image 96).  Mediastinum/Nodes: The heart is top-normal in size. No pericardial effusion.  Coronary atherosclerosis.  Atherosclerotic calcifications of the aortic arch.  No suspicious mediastinal, hilar, or axillary lymphadenopathy.  Lungs/Pleura: No suspicious pulmonary nodules.  Mild centrilobular emphysematous changes.  Dependent atelectasis in the bilateral lower lobes, left greater than right.  Small left and trace right pleural effusions.  No pneumothorax.  Upper abdomen: Visualized upper abdomen is unremarkable.  Musculoskeletal: Mild superior endplate changes of two lower thoracic vertebral bodies (series 403/image 65), chronic.  Review of the MIP images confirms the above findings.  IMPRESSION: No evidence of pulmonary embolism.  Stable penetrating atherosclerotic ulcer along the left lateral aspect of the aortic arch.  Mild centrilobular emphysematous changes.  Small left and trace right pleural effusions. Associated dependent atelectasis in the bilateral lower lobes.   Electronically Signed   By: Charline Bills M.D.   On: 01/25/2015 21:07    Medications:  Scheduled: . antiseptic oral rinse  7 mL Mouth Rinse BID  . aspirin EC  81 mg Oral Daily  . benazepril  5 mg Oral Daily  . calcium carbonate  1 tablet Oral BID WC  .  enoxaparin (LOVENOX) injection  30 mg Subcutaneous Q24H  . escitalopram  5 mg Oral Daily  . ipratropium-albuterol  3 mL Nebulization Daily  . iron polysaccharides  150 mg Oral Daily  . levothyroxine  25 mcg Oral QAC breakfast  . pantoprazole  40 mg Oral Daily  . sodium chloride  3 mL Intravenous Q12H  . sodium chloride  3 mL Intravenous Q12H   Continuous:  ZOX:WRUEAV chloride, acetaminophen **OR** acetaminophen, albuterol, alum & mag hydroxide-simeth, HYDROmorphone (DILAUDID) injection, ondansetron **OR** ondansetron (ZOFRAN) IV, oxyCODONE, sodium chloride  Assessment/Plan:  Principal Problem:   Hypoxia Active Problems:   History of stroke   Hypertension   Thyroid disease   COPD (chronic obstructive pulmonary disease)   Dysphagia   Tobacco abuse   Nausea and vomiting   SOB (shortness of breath)   Hypoxemia  Acute respiratory failure with hypoxia CT chest was negative for PE. COPD is likely responsible for her hypoxia. Aspiration is also possible, though no infiltrates were noted on imaging films. She will likely need to be on home oxygen. Continue to monitor for now.  Nausea and vomiting Appears to have resolved. No clear etiology found. Has been tolerating liquids orally. Abdomen is benign.  Dysphagia. She does have a known history of oropharyngeal dysphagia. She has been evaluated by speech therapy in the past. She underwent a swallow evaluation in December 2015. She was placed on a dysphagia 3 diet with nectar thick liquids. A swallow test showed narrowing at the GE junction. Gastroenterology has been consulted. She will likely need endoscopy.  History of COPD Does not appear to have any exacerbation at this time. Continue current treatment. She may need home oxygen. Check room air oxygen at rest and with ambulation.  History of stroke Stable. Continue aspirin. Apparently she has impaired mobility at baseline. She did tell me that she sometimes gets up with a walker.  Await PT and OT evaluation.  History of essential hypertension. Continue with her antihypertensive regimen  History of hypothyroidism Continue levothyroxin.  DVT Prophylaxis: Lovenox    Code Status: Full code  Family Communication: Discussed with the patient. Discussed with her daughter over the phone.  Disposition Plan: PT and OT to see. GI to see.    [facility] LLCKRISHNAN,Laurissa Cowper  [facility] Pager [phone] 01/27/2015, 7:35 AM  If 7PM-7AM, please contact night-coverage at [email or web address], password [email or web address]

## 2015-01-27 NOTE — Consult Note (Signed)
[facility] Consult: 2:26 PM 01/27/2015     Referring Provider: Dr  Barnie Del  Primary Care Physician:  April Ravel, MD Primary Gastroenterologist:  unassigned    Reason for Consultation:  Odynophagia, dysphagia.   HPI: April Mccoy is a 81 y.o. female.  Patient is wheelchair bound for the last 3 years due to a history of strokes and hip fracture. Status post" gastrectomy" of unclear extent for ulcer disease when she was in her 35s or 68s. Hypothyroidism.  Presented to the ED with weakness after 1 week of odynophagia and poor by mouth intake and more recent loose stools within 24 hours of the admission.  Patient, and her daughter, give a history of several months at minimum of difficulty swallowing both solids and liquids. She often chokes on by mouth intake and spews up liquids mostly.  She denies nausea and vomiting. The "emesis" is more regurgitation.  She never had odynophagia until about a week ago. Daughter does not feel that the patient has lost weight.  Takes omeprazole 40 mg daily. Also takes weekly Fosamax. Patient also complained of shortness of breath without fever or chills. In the ED she was hypoxic with sats of 82% on room air which improved with addition of nasal cannula oxygen. Labs results reveal a normocytic anemia with a hemoglobin of 9 down from 11.8 in 08/2014.  Both her ferritin and iron levels are low so she has been started on oral iron.    Past Medical History  Diagnosis Date  . Hypertension   . Stroke     with h/o dysphagia post stroke  . Thyroid disease   . Left shoulder pain     DJD  . UTI (lower urinary tract infection)     Enterobacter aerogenes  . COPD (chronic obstructive pulmonary disease)   . GERD (gastroesophageal reflux disease)   . Abnormality of gait   .  Hard of hearing   . Anemia   . Depression   . Tobacco abuse   . Wheelchair bound   . Bowel obstruction   . Fracture of coccyx     history  . Shortness of breath dyspnea     Past Surgical History  Procedure Laterality Date  . Hernia repair    . Hip fracture surgery    . Joint replacement      b/l hips  . Gastrectomy  In her 40s versus 50s.    for ulcer.  No details as to the extent of surgery.  . Abdominal hysterectomy    . Cataract extraction      Prior to Admission medications   Medication Sig Start Date End Date Taking? Authorizing Provider  acetaminophen (TYLENOL) 325 MG tablet Take 650 mg by mouth every 6 (six) hours as needed for mild pain.   Yes Historical Provider, MD  alendronate (FOSAMAX) 70 MG tablet Take 70 mg by mouth once a week.  07/31/14  Yes Historical Provider, MD  aspirin EC 81 MG tablet Take 81 mg by mouth daily.   Yes Historical Provider, MD  benazepril (LOTENSIN) 10 MG tablet Take 5 mg by mouth daily.  02/28/14  Yes Historical Provider, MD  calcium carbonate (OS-CAL - DOSED IN MG OF ELEMENTAL CALCIUM) 1250 MG tablet Take 1 tablet (500 mg of elemental calcium total) by mouth 2 (two) times daily with a meal. 08/14/14  Yes Marrian Salvage, MD  escitalopram (LEXAPRO) 5 MG tablet Take 5 mg by mouth daily.   Yes Historical Provider, MD  ipratropium-albuterol (DUONEB) 0.5-2.5 (3) MG/3ML SOLN Inhale 3 mLs into the lungs daily.  07/25/14  Yes Historical Provider, MD  levothyroxine (SYNTHROID, LEVOTHROID) 25 MCG tablet Take 25 mcg by mouth daily before breakfast.   Yes Historical Provider, MD  nicotine (NICODERM CQ - DOSED IN MG/24 HOURS) 21 mg/24hr patch Place 1 patch (21 mg total) onto the skin daily. Patient not taking: Reported on 01/25/2015 08/14/14   Marrian Salvage, MD  omeprazole (PRILOSEC) 40 MG capsule Take 40 mg by mouth daily.   Yes Historical Provider, MD  Vitamin D, Ergocalciferol, (DRISDOL) 50000 UNITS CAPS capsule Take 1 capsule (50,000 Units total) by  mouth every 7 (seven) days. Patient taking differently: Take 50,000 Units by mouth every 7 (seven) days. Takes on Mondays 08/14/14  Yes Marrian Salvage, MD    Scheduled Meds: . antiseptic oral rinse  7 mL Mouth Rinse BID  . aspirin EC  81 mg Oral Daily  . benazepril  5 mg Oral Daily  . calcium carbonate  1 tablet Oral BID WC  . enoxaparin (LOVENOX) injection  30 mg Subcutaneous Q24H  . escitalopram  5 mg Oral Daily  . ipratropium-albuterol  3 mL Nebulization Daily  . iron polysaccharides  150 mg Oral Daily  . levothyroxine  25 mcg Oral QAC breakfast  . pantoprazole  40 mg Oral Daily  . sodium chloride  3 mL Intravenous Q12H  . sodium chloride  3 mL Intravenous Q12H   Infusions:   PRN Meds: sodium chloride, acetaminophen **OR** acetaminophen, albuterol, alum & mag hydroxide-simeth, HYDROmorphone (DILAUDID) injection, ondansetron **OR** ondansetron (ZOFRAN) IV, oxyCODONE, RESOURCE THICKENUP CLEAR, sodium chloride   Allergies as of 01/25/2015  . (No Known Allergies)    Family History  Problem Relation Age of Onset  . Heart disease Mother     died age 79 MI  . Hypertension Mother   . Alcohol abuse Father   . Cancer      sister, 1 brother (lung) 1 brother (colon), daughter x 2 deceased 1 died age 33 pancreatitic another died age 25 lung  . Diabetes Son   . COPD Son     History   Social History  . Marital Status: Widowed    Spouse Name: N/A  . Number of Children: N/A  . Years of Education: N/A   Occupational History  . Not on file.   Social History Main Topics  . Smoking status: Former Games developer  . Smokeless tobacco: Former Neurosurgeon  . Alcohol Use: No  . Drug Use: No  . Sexual Activity: Not on file   Other Topics Concern  . Not on file   Social History Narrative   Lives alone in [place]   Smoking since age 73 3 packs will last 3-4 days, trying to cut back and quit   1 living daughter, 2 daughters deceased    3 living sons    Unable to do ADLs daughter [name] is  helping with these at home 6 days per week from 7:30 a to 5:30/6 p  REVIEW OF SYSTEMS: Constitutional:  No weight loss. She is perpetually thin. She is wheelchair bound but can slide from the wheelchair into bed on her own. ENT:  No nose bleeds Pulm:  Chronic cough. No dyspnea but then she never exerts herself. CV:  No palpitations, no LE edema.  GU:  No hematuria, no frequency GI:  Per hpi Heme:  No unusual bleeding or bruising.   Transfusions:  None Neuro:  No headaches, no peripheral tingling or numbness Derm:  No itching, no rash or sores.  Endocrine:  No sweats or chills.  No polyuria or dysuria Immunization:  Did not inquire. Travel:  None beyond local counties in last few months.    PHYSICAL EXAM: Vital signs in last 24 hours: Filed Vitals:   01/27/15 1034  BP: 127/51  Pulse: 70  Temp: 98.3  Resp: 127/51   Wt Readings from Last 3 Encounters:  01/25/15 102 lb 6.4 oz (46.448 kg)  08/15/14 102 lb 11.2 oz (46.584 kg)    General: Aged, thin, non-acutely ill appearing WF. She is comfortable. She is very HOH Head:  No facial asymmetry or swelling. No signs of head trauma.  Eyes:  No scleral icterus, no conjunctival pallor. Ears:  HOH  Nose:  Congestion or discharge Mouth:  Slightly dry mucous membranes. Tongue midline. Edentulous. Neck:  No mass, no JVD, no TMG. Lungs:  Some fine crackles in the bases. Excellent breath sounds. Cough present. No dyspnea. Heart: RRR. No MRG. Abdomen:  Soft, thin, NT, ND.  No mass. No HSM. Well-healed midline incision..   Rectal: Deferred   Musc/Skeltl: Spinal kyphosis. Extremities:  No CCE.  Neurologic:  HOH.  No tremor. Moves all 4 limbs. Limb strength not tested. Skin:  No sores, rashes or telangiectasia. Tattoos:  None Nodes:  No cervical adenopathy.   Psych:  Cooperative, relaxed, pleasant.  Intake/Output from previous day: 05/22 0701 - 05/23 0700 In: 243 [P.O.:240; I.V.:3] Out: -  Intake/Output this shift:    LAB  RESULTS:  Recent Labs  01/25/15 1600 01/26/15 0550 01/27/15 0425  WBC 6.5 5.2 5.9  HGB 9.9* 9.4* 9.0*  HCT 33.3* 32.2* 30.3*  PLT 238 231 226   BMET Lab Results  Component Value Date   NA 137 01/27/2015   NA 139 01/26/2015   NA 138 01/25/2015   K 3.7 01/27/2015   K 3.9 01/26/2015   K 4.0 01/25/2015   CL 103 01/27/2015   CL 107 01/26/2015   CL 106 01/25/2015   CO2 29 01/27/2015   CO2 28 01/26/2015   CO2 25 01/25/2015   GLUCOSE 83 01/27/2015   GLUCOSE 80 01/26/2015   GLUCOSE 96 01/25/2015   BUN 15 01/27/2015   BUN 13 01/26/2015   BUN 15 01/25/2015   CREATININE 0.65 01/27/2015   CREATININE 0.73 01/26/2015   CREATININE 0.67 01/25/2015   CALCIUM 9.0 01/27/2015   CALCIUM 9.1 01/26/2015   CALCIUM 9.4 01/25/2015   LFT  Recent Labs  01/25/15 1600  PROT 6.0*  ALBUMIN 3.1*  AST 16  ALT 14  ALKPHOS 70  BILITOT 0.5   PT/INR No results found for: INR, PROTIME Hepatitis Panel No results for input(s): HEPBSAG, HCVAB, HEPAIGM, HEPBIGM in the last 72 hours. C-Diff No components found for: CDIFF Lipase     Component Value Date/Time   LIPASE 17* 01/25/2015 1600    RADIOLOGY STUDIES: Dg Chest 2 View  01/25/2015   CLINICAL DATA:  Patient with vomiting. Feels as if food is getting stuck.  Complaining of diarrhea and cough.  EXAM: CHEST  2 VIEW  COMPARISON:  Rib radiographs 08/13/2014; chest CT 08/13/2014  FINDINGS: Stable cardiac and mediastinal contours with tortuosity of the thoracic aorta. Lungs are hyperexpanded. No consolidative pulmonary opacities. No pleural effusion or pneumothorax. Mid thoracic spine degenerative changes.  IMPRESSION: No acute osseous abnormality.   Electronically Signed   By: Annia Beltrew  Davis M.D.   On: 01/25/2015 17:10   Ct Angio Chest Pe W/cm &/or Wo Cm  01/25/2015   CLINICAL DATA:  Hypoxia, cough  EXAM: CT ANGIOGRAPHY CHEST WITH CONTRAST  TECHNIQUE: Multidetector CT imaging of the chest was performed using the standard protocol during bolus  administration of intravenous contrast. Multiplanar CT image reconstructions and MIPs were obtained to evaluate the vascular anatomy.  CONTRAST:  100mL OMNIPAQUE IOHEXOL 350 MG/ML SOLN  COMPARISON:  Chest radiographs dated 01/25/2015. CTA chest dated 08/13/2014.  FINDINGS: No evidence of pulmonary embolism.  Although not tailored for evaluation of the aorta, there is a stable penetrating atherosclerotic ulcer along the left lateral aspect of the aortic arch just prior to the proximal descending thoracic aorta (series 406/image 96).  Mediastinum/Nodes: The heart is top-normal in size. No pericardial effusion.  Coronary atherosclerosis.  Atherosclerotic calcifications of the aortic arch.  No suspicious mediastinal, hilar, or axillary lymphadenopathy.  Lungs/Pleura: No suspicious pulmonary nodules.  Mild centrilobular emphysematous changes.  Dependent atelectasis in the bilateral lower lobes, left greater than right.  Small left and trace right pleural effusions.  No pneumothorax.  Upper abdomen: Visualized upper abdomen is unremarkable.  Musculoskeletal: Mild superior endplate changes of two lower thoracic vertebral bodies (series 403/image 65), chronic.  Review of the MIP images confirms the above findings.  IMPRESSION: No evidence of pulmonary embolism.  Stable penetrating atherosclerotic ulcer along the left lateral aspect of the aortic arch.  Mild centrilobular emphysematous changes.  Small left and trace right pleural effusions. Associated dependent atelectasis in the bilateral lower lobes.   Electronically Signed   By: Charline BillsSriyesh  Krishnan M.D.   On: 01/25/2015 21:07   Dg Esophagus  01/27/2015   CLINICAL DATA:  Dysphagia  EXAM: ESOPHOGRAM/BARIUM SWALLOW  TECHNIQUE: Single contrast examination was performed using nectar thick barium.  FLUOROSCOPY TIME:  Radiation Exposure Index (as provided by the fluoroscopic device): 10.8 mGy  COMPARISON:  None.  FINDINGS: Limited examination secondary to limited patient  mobility. The examination was performed in the semi-upright position. There was normal pharyngeal anatomy. Contrast flowed freely through the esophagus without evidence of a mass. There was normal esophageal mucosa without evidence of irregularity or ulceration. There are tertiary contractions of the esophagus intermittently. There is mild gastroesophageal reflux. There is a persistent narrowing at the esophagogastric junction which restricts the passage of a 13 mm barium tablet concerning for a mild stricture. No definite hiatal hernia was demonstrated.  IMPRESSION: 1. Persistent narrowing at the esophagogastric junction which restricts the passage of a 13 mm barium tablet concerning for a mild stricture. 2. Mild gastroesophageal reflux. 3. Mild intermittent tertiary contractions of the esophagus as can be seen with spasm.   Electronically Signed   By: Elige KoHetal  Patel   On: 01/27/2015 11:15    ENDOSCOPIC STUDIES: Underwent colonoscopy at least 10 years ago. She doesn't recall is a GI doctor was aware if anything was found on this study. No previous EGD.  IMPRESSION:   *  Dysphagia and esophageal stricture on esophagram. Also has esophageal spasm,dysmotility. Silent aspiration of thin liquids on speech language study, therefore prescribed thickened  liquids.  *  Iron deficiency anemia. Started on by mouth iron.  TSH is normal.   PLAN:     *  Per Dr. Rhea Belton.  Discussed EGD with dilation of stricture with the patient and her daughter. They are agreeable to proceed. We'll need to hold the Lovenox beforehand.  *  This is a patient who should not be taking Fosamax as it can cause significant esophageal injury in patients with dysphagia.  Should probably be permanently discontinued.   Jennye Moccasin  01/27/2015, 2:26 PM Pager: [phone]

## 2015-01-27 NOTE — Evaluation (Addendum)
Physical Therapy Evaluation Patient Details Name: April Mccoy MRN: [id number] DOB: 09/16/1928 Today's Date: 01/27/2015   History of Present Illness  Adm with nausea/vomiting and SOB. COPD exacerbation and found to aspirate thin liquids PMHx- COPD, HTN CVA   Clinical Impression  Pt admitted with above diagnosis. Spoke with patient's daughter, April Mccoy, re: home situation. Pt is alone from 2pm-7pm each day and uses her wheelchair only during this time. Currently pt is weaker than usual "I haven't stood up for 2 days." Pt currently with functional limitations due to the deficits listed below (see PT Problem List). Pt will benefit from skilled PT to increase their independence and safety with mobility to allow discharge to the venue listed below.       Follow Up Recommendations Home health PT;Supervision/Assistance - 24 hour     Equipment Recommendations   Daughter wants 1/2 lap tray for wheelchair (daughter interested if insurance will cover)    Recommendations for Other Services OT consult     Precautions / Restrictions Precautions Precautions: Fall      Mobility  Bed Mobility Overal bed mobility: Needs Assistance Bed Mobility: Rolling;Sidelying to Sit Rolling: Supervision Sidelying to sit: Min assist       General bed mobility comments: HOB 20 with rail (attempted without and pt with incr effort and unsuccessful); lacks trunk strength to sit up to EOB without assist  Transfers Overall transfer level: Needs assistance Equipment used: Rolling walker (2 wheeled) Transfers: Sit to/from Stand Sit to Stand: Min assist         General transfer comment: vc for safe use of DME with each of 4 transfers (tries to pull up on RW or hold onto it while she descends with RW tipping posteriorly)  Ambulation/Gait Ambulation/Gait assistance: Mod assist Ambulation Distance (Feet): 30 Feet Assistive device: Rolling walker (2 wheeled) Gait Pattern/deviations: Step-to pattern;Decreased  stance time - right;Decreased stride length;Decreased weight shift to right;Trunk flexed     General Gait Details: Became fatigued after 15 ft and c/o dizziness (and RLE began bucking). Required mod assist to prevent RLE giving away  Administratortairs            Wheelchair Mobility Wheelchair Mobility Wheelchair mobility:  (not tested; reports she self-propels with arms and legs)  Modified Rankin (Stroke Patients Only)       Balance Overall balance assessment: Needs assistance Sitting-balance support: No upper extremity supported;Feet supported Sitting balance-Leahy Scale: Fair     Standing balance support: Bilateral upper extremity supported Standing balance-Leahy Scale: Poor                               Pertinent Vitals/Pain See separate note re: O2 saturations  Pain Assessment: No/denies pain    Home Living Family/patient expects to be discharged to:: Private residence Living Arrangements: Alone Available Help at Discharge: Family;Other (Comment) Type of Home: Apartment Home Access: Level entry     Home Layout: One level Home Equipment: Wheelchair - manual;Shower seat;Bedside commode;Walker - 2 wheels;Grab bars - toilet      Prior Function Level of Independence: Needs assistance   Gait / Transfers Assistance Needed: uses w/c for daily mobility; walks only when someone is there to help  ADL's / Homemaking Assistance Needed: daughter assist with bathing, dressing and meal prep (pt only has to put food into microwave to reheat)  Comments: Spoke with daughter, she is present from 6a-2p daily; pt uses w/c when home alone and does  squat-pivot w/c to recliner, and is asleep in recliner by 7p.      Hand Dominance        Extremity/Trunk Assessment   Upper Extremity Assessment: Defer to OT evaluation;Generalized weakness           Lower Extremity Assessment: RLE deficits/detail;LLE deficits/detail RLE Deficits / Details: knee extension 3+ (buckling  with fatigue) LLE Deficits / Details: knee extension 4/5  Cervical / Trunk Assessment: Kyphotic  Communication   Communication: HOH  Cognition Arousal/Alertness: Awake/alert Behavior During Therapy: WFL for tasks assessed/performed Overall Cognitive Status: Difficult to assess                      General Comments      Exercises General Exercises - Lower Extremity Ankle Circles/Pumps: AROM;Both;15 reps Long Arc Quad: AROM;Both;5 reps      Assessment/Plan    PT Assessment Patient needs continued PT services  PT Diagnosis Difficulty walking   PT Problem List Decreased strength;Decreased activity tolerance;Decreased balance;Decreased mobility;Decreased knowledge of use of DME;Cardiopulmonary status limiting activity  PT Treatment Interventions DME instruction;Gait training;Functional mobility training;Therapeutic activities;Therapeutic exercise;Balance training;Cognitive remediation;Patient/family education;Wheelchair mobility training   PT Goals (Current goals can be found in the Care Plan section) Acute Rehab PT Goals Patient Stated Goal: go home (does not want NHP) PT Goal Formulation: With patient Time For Goal Achievement: 02/03/15 Potential to Achieve Goals: Good    Frequency Min 3X/week   Barriers to discharge Decreased caregiver support home alone 2pm to bedtime    Co-evaluation               End of Session Equipment Utilized During Treatment: Gait belt Activity Tolerance: Patient limited by fatigue Patient left: in chair;with call bell/phone within reach;with chair alarm set      Functional Assessment Tool Used: clinical judgement Functional Limitation: Mobility: Walking and moving around Mobility: Walking and Moving Around Current Status [phone]): At least 20 percent but less than 40 percent impaired, limited or restricted Mobility: Walking and Moving Around Goal Status [phone]): At least 1 percent but less than 20 percent impaired, limited or  restricted    Time: 1105-1137 PT Time Calculation (min) (ACUTE ONLY): 32 min   Charges:   PT Evaluation $Initial PT Evaluation Tier I: 1 Procedure PT Treatments $Gait Training: 8-22 mins   PT G Codes:   PT G-Codes **NOT FOR INPATIENT CLASS** Functional Assessment Tool Used: clinical judgement Functional Limitation: Mobility: Walking and moving around Mobility: Walking and Moving Around Current Status ([id number]): At least 20 percent but less than 40 percent impaired, limited or restricted Mobility: Walking and Moving Around Goal Status [phone]): At least 1 percent but less than 20 percent impaired, limited or restricted    Noelie Renfrow 01/27/2015, 12:58 PM Pager [phone]

## 2015-01-27 NOTE — Progress Notes (Signed)
Speech Language Pathology  Patient Details Name: April Mccoy MRN: [id number] DOB: 09/01/1929 Today's Date: 01/27/2015 Time:  -        MBS completed. Full report will be documented. Silent aspiration with thin barium. Recommend continue Dys 3 and nectar thick, pills whole in applesauce.     Breck CoonsLisa Willis ZionLitaker M.Ed [employer] Pager [phone]

## 2015-01-27 NOTE — Progress Notes (Signed)
SATURATION QUALIFICATIONS: (This note is used to comply with regulatory documentation for home oxygen)  Patient Saturations on Room Air at Rest = 86%  Patient Saturations on Room Air while Ambulating = 94%  Patient Saturations on -- Liters of oxygen while Ambulating = --%  Please briefly explain why patient needs home oxygen:  NOTE: Pt did not require O2 when walking, however at rest pre- and post-walk she desaturated to 86% on room air. Per discussion with daughter, pt is very sedentary at home and she recently had a very hard time waking her mother (which is unusual).   01/27/2015 Veda CanningLynn Staria Birkhead, PT Pager: [phone]

## 2015-01-28 ENCOUNTER — Encounter (HOSPITAL_COMMUNITY): Payer: Self-pay | Admitting: *Deleted

## 2015-01-28 ENCOUNTER — Encounter (HOSPITAL_COMMUNITY)
Admission: EM | Disposition: A | Discharge status: Home / Self Care | Payer: Self-pay | Source: Home / Self Care | Attending: Emergency Medicine

## 2015-01-28 DIAGNOSIS — J449 Chronic obstructive pulmonary disease, unspecified: Secondary | ICD-10-CM | POA: Diagnosis not present

## 2015-01-28 DIAGNOSIS — K2210 Ulcer of esophagus without bleeding: Secondary | ICD-10-CM | POA: Diagnosis present

## 2015-01-28 DIAGNOSIS — R0902 Hypoxemia: Secondary | ICD-10-CM | POA: Diagnosis not present

## 2015-01-28 DIAGNOSIS — Z8673 Personal history of transient ischemic attack (TIA), and cerebral infarction without residual deficits: Secondary | ICD-10-CM | POA: Diagnosis not present

## 2015-01-28 DIAGNOSIS — I10 Essential (primary) hypertension: Secondary | ICD-10-CM | POA: Diagnosis not present

## 2015-01-28 DIAGNOSIS — R1310 Dysphagia, unspecified: Secondary | ICD-10-CM | POA: Diagnosis not present

## 2015-01-28 HISTORY — PX: BALLOON DILATION: SHX5330

## 2015-01-28 HISTORY — PX: ESOPHAGOGASTRODUODENOSCOPY: SHX5428

## 2015-01-28 SURGERY — EGD (ESOPHAGOGASTRODUODENOSCOPY)
Anesthesia: Moderate Sedation

## 2015-01-28 MED ORDER — SUCRALFATE 1 GM/10ML PO SUSP
1.0000 g | Freq: Three times a day (TID) | ORAL | Status: DC
  Administered 2015-01-28 – 2015-01-29 (×5): 1 g via ORAL
  Filled 2015-01-28 (×8): qty 10

## 2015-01-28 MED ORDER — FENTANYL CITRATE (PF) 100 MCG/2ML IJ SOLN
INTRAMUSCULAR | Status: AC
  Filled 2015-01-28: qty 2

## 2015-01-28 MED ORDER — DIPHENHYDRAMINE HCL 50 MG/ML IJ SOLN
INTRAMUSCULAR | Status: AC
  Filled 2015-01-28: qty 1

## 2015-01-28 MED ORDER — MIDAZOLAM HCL 10 MG/2ML IJ SOLN
INTRAMUSCULAR | Status: DC | PRN
Start: 2015-01-28 — End: 2015-01-28
  Administered 2015-01-28 (×3): 1 mg via INTRAVENOUS

## 2015-01-28 MED ORDER — BUTAMBEN-TETRACAINE-BENZOCAINE 2-2-14 % EX AERO
INHALATION_SPRAY | CUTANEOUS | Status: DC | PRN
  Administered 2015-01-28: 1 via TOPICAL

## 2015-01-28 MED ORDER — FENTANYL CITRATE (PF) 100 MCG/2ML IJ SOLN
INTRAMUSCULAR | Status: DC | PRN
  Administered 2015-01-28 (×2): 12.5 ug via INTRAVENOUS

## 2015-01-28 MED ORDER — MIDAZOLAM HCL 5 MG/ML IJ SOLN
INTRAMUSCULAR | Status: AC
  Filled 2015-01-28: qty 2

## 2015-01-28 NOTE — Progress Notes (Signed)
Physical Therapy Treatment Patient Details Name: April NewcomerVallie M Bartram MRN: [id number] DOB: 07/22/1929 Today's Date: 01/28/2015    History of Present Illness Adm with nausea/vomiting and SOB. COPD exacerbation and found to aspirate thin liquids PMHx- COPD, HTN CVA     PT Comments    Pt moving much better today (even after sedation for endoscopy procedure!). She demonstrated ability to transfer modified independent x4 (simulating OOB to wheelchair; wheelchair to [facility] and reverse of each). From a PT perspective, pt is safe to discharge home with the intermittent supervision of her daughter.   Follow Up Recommendations  Home health PT;Supervision - Intermittent (as PTA)     Equipment Recommendations  Daughter wants 1/2 lap tray for wheelchair to increase ease of pt transporting her dinner to living room (daughter interested if insurance will cover)    Recommendations for Other Services       Precautions / Restrictions Precautions Precautions: Fall    Mobility  Bed Mobility Overal bed mobility: Modified Independent Bed Mobility: Supine to Sit;Sit to Supine     Supine to sit: Modified independent (Device/Increase time) Sit to supine: Modified independent (Device/Increase time)   General bed mobility comments: HOb flat, no rail; pt uses edge of mattress to pull herself up  Transfers Overall transfer level: Modified independent Equipment used: None Transfers: Squat Pivot Transfers     Squat pivot transfers: Modified independent (Device/Increase time)     General transfer comment: pt able to direct where she places her wheelchair when getting OOB and to [facility] (directly in front of her, she pulls on armrests, and then pivots 180 degrees); repeated x 4 with pt safe each transfer  Ambulation/Gait                 Stairs            Wheelchair Mobility    Modified Rankin (Stroke Patients Only)       Balance Overall balance assessment: Needs  assistance Sitting-balance support: No upper extremity supported;Feet unsupported Sitting balance-Leahy Scale: Good Sitting balance - Comments: able to shift her weight and perform pericare in sitting (on BSC) prior to transfer back to chair                            Cognition Arousal/Alertness: Awake/alert Behavior During Therapy: [facility] for tasks assessed/performed Overall Cognitive Status: Within Functional Limits for tasks assessed                      Exercises      General Comments General comments (skin integrity, edema, etc.): Spoke with Lelon MastSamantha, CM twice today as pt for possible d/c and ?'g if she would be safe (especially when it was thought she could not have HHPT). CM later got pt's insurance information updated/corrected and will have HH safety evaluation.      Pertinent Vitals/Pain SaO2 94-96%on room air while mobilizing; on return to supine 89% on room air  Pain Assessment: No/denies pain    Home Living                      Prior Function            PT Goals (current goals can now be found in the care plan section) Acute Rehab PT Goals Patient Stated Goal: go home (does not want NHP) PT Goal Formulation: With patient Time For Goal Achievement: 02/03/15 Potential to Achieve Goals: Good Progress towards  PT goals: Progressing toward goals    Frequency  Min 3X/week    PT Plan Current plan remains appropriate    Co-evaluation             End of Session   Activity Tolerance: Patient tolerated treatment well Patient left: with call bell/phone within reach;in bed;with bed alarm set     Time: 1610-9604 PT Time Calculation (min) (ACUTE ONLY): 21 min  Charges:  $Wheel Chair Management: 8-22 mins                    G Codes:      Billiejean Schimek 2015/02/20, 3:25 PM Pager [phone]

## 2015-01-28 NOTE — Progress Notes (Signed)
PT Cancellation Note  Patient Details Name: April NewcomerVallie M Abercrombie MRN: [id number] DOB: 01/18/1929   Cancelled Treatment:    Reason Eval/Treat Not Completed: Patient at procedure or test/unavailable. Gone to endoscopy. Will attempt later today as schedule permits   Maygan Koeller 01/28/2015, 10:57 AM Pager [phone]

## 2015-01-28 NOTE — Progress Notes (Signed)
Speech Language Pathology Treatment:   Dysphagia Patient Details Name: April NewcomerVallie M Huhn MRN: [id number] DOB: 12/05/1928 Today's Date: 01/28/2015 Time: [id number] SLP Time Calculation (min) (ACUTE ONLY): 10 min  Assessment / Plan / Recommendation Clinical Impression  Pt seen for dysphagia treatment following MBS yesterday. EGD performed earlier today 5/24 revealed multiple ulcers were found in the lower third of the esophagus;biopsies were taken.  Possible stasis ulceration versus pill-inducted ulceration, 3 cm hiatal hernia. Min verbal reminders for small sips provided during cup sips nectar juice; one delayed cough (difficult to determine baseline versus po induced). Pt with chronic dysphagia and will remain at aspiration risk in particular in times of illness. She states possible discharge tomorrow and SLP reviewed swallow strategies and continued use of thickener.    HPI Other Pertinent Information: 81 y.o. female with a history of COPD, HTN CVA hx, Hypothyroid admitted after 3-4 days of increased nausea and vomiting and SOB after eating. CXR no acute osseous abnormality. MBS 08/14/14 with intermittent silent aspiration with thin and nectar and ocassional sensed aspiration with weak/ineffective cough. Dys 3/nectar liquids recommended.   Pertinent Vitals    SLP Plan    continue while in acute care    Recommendations  No f/u                   GO     Royce MacadamiaLitaker, Tiernan Millikin Willis 01/28/2015, 2:36 PM  Breck CoonsLisa Willis Lonell FaceLitaker M.Ed [employer]-SLP Pager [phone]

## 2015-01-28 NOTE — Op Note (Signed)
[facility] [address] [place], [id number]   ENDOSCOPY PROCEDURE REPORT  PATIENT: April Mccoy, April Mccoy  MR#: [id number] BIRTHDATE: [birth year] , 85  yrs. old GENDER: female ENDOSCOPIST: Beverley FiedlerJay Mccoy Pyrtle, MD REFERRED BY:  [facility] PROCEDURE DATE:  01/28/2015 PROCEDURE:  EGD, diagnostic and EGD w/ biopsy ASA CLASS:     Class III INDICATIONS:  dysphagia and abnormal barium esophagogram. MEDICATIONS: Fentanyl 25 mcg IV and Versed 3 mg IV TOPICAL ANESTHETIC: Cetacaine Spray  DESCRIPTION OF PROCEDURE: After the risks benefits and alternatives of the procedure were thoroughly explained, informed consent was obtained.  The Pentax Gastroscope [id number] endoscope was introduced through the mouth and advanced to the second portion of the duodenum , Without limitations.  The instrument was slowly withdrawn as the mucosa was fully examined.   ESOPHAGUS: Several non-bleeding, clean-based, round and linear ulcers were found in the lower third of the esophagus.  These ulcers were located on 2 walls in linear fashion.  Query stasis or pill induced ulcerations.  Biopsies were taken from center and edge of ulcer, but doubt viral related.  The Z-line was regular and no stricture or ring was seen, therefore dilation not performed.  STOMACH: A 3 cm hiatal hernia was noted.   The mucosa of the stomach appeared normal.  DUODENUM: The duodenal mucosa showed no abnormalities in the bulb and 2nd part of the duodenum.  Retroflexed views revealed a hiatal hernia.     The scope was then withdrawn from the patient and the procedure completed.  COMPLICATIONS: There were no immediate complications.     ENDOSCOPIC IMPRESSION: 1.   Multiple ulcers were found in the lower third of the esophagus; biopsies were taken.  Possible stasis ulceration versus pill-inducted ulceration 2.   3 cm hiatal hernia 3.   The mucosa of the stomach appeared normal 4.   The duodenal mucosa  showed no abnormalities in the bulb and 2nd part of the duodenum  RECOMMENDATIONS: 1.  Anti-reflux precautions, remain upright for 60 min after eating 2.  Daily PPI 3.  Crush pills and take with applesauce or pudding whenever possible.  Agree with avoiding Fosamax. 4.  Await biopsy results 5.  Liquid sucralfate 1 g before meals and bedtime may help symptoms. 6.  Esophageal dysmotility, with very limited treatment options, with possible element of esophageal spasm is the most likely cause of dysphagia.  Helping heal ulcers will also improve symptoms.  eSigned:  Beverley FiedlerJay Mccoy Pyrtle, MD 01/28/2015 11:11 AM    CC: the patient  PATIENT NAME:  April Mccoy, April Mccoy MR#: [id number]

## 2015-01-28 NOTE — Progress Notes (Signed)
[facility] PROGRESS NOTE  April Mccoy UYQ:[id number] DOB: 1929-05-29 DOA: 01/25/2015  PCP: Ailene Ravel, MD  Brief HPI: 81 year old Caucasian female with a past medical history of COPD, hypertension, history of stroke, presented with 3-4 day history of nausea and vomiting. In the emergency department she was found to be hypoxic, saturating about 82% on room air. CT angiogram was negative for PE. She also mentioned difficulty with swallowing and feeling of food getting stuck in her chest. She was admitted for further management. Barium swallow was done which shows narrowing at the EG junction. Gastroenterology was consulted. Patient to undergo EGD today.  Past medical history:  Past Medical History  Diagnosis Date  . Hypertension   . Stroke     with h/o dysphagia post stroke  . Thyroid disease   . Left shoulder pain     DJD  . UTI (lower urinary tract infection)     Enterobacter aerogenes  . COPD (chronic obstructive pulmonary disease)   . GERD (gastroesophageal reflux disease)   . Abnormality of gait   . Hard of hearing   . Anemia   . Depression   . Tobacco abuse   . Wheelchair bound   . Bowel obstruction   . Fracture of coccyx     history  . Shortness of breath dyspnea     Consultants: [facility]  Procedures: EGD today  Antibiotics: None  Subjective: Patient continues to have difficulty swallowing. Otherwise, she feels well. Asking for breakfast this morning.   Objective: Vital Signs  Filed Vitals:   01/27/15 1034 01/27/15 1507 01/27/15 2050 01/28/15 0314  BP: 127/51 122/67 142/68 115/59  Pulse: 70 5 87 65  Temp:  97.7 F (36.5 C) 98.8 F (37.1 C) 98.2 F (36.8 C)  TempSrc:  Oral Oral Oral  Resp:  16 18 18   Height:      Weight:      SpO2:  97% 96% 98%   No intake or output data in the 24 hours ending 01/28/15 0724 Filed Weights   01/25/15 2317  Weight: 46.448 kg (102 lb 6.4 oz)    General appearance: alert, cooperative,  appears stated age and no distress Resp: Coarse breath sounds bilaterally. No wheezing. No rhonchi. No crackles. Unchanged Cardio: regular rate and rhythm, S1, S2 normal, no murmur, click, rub or gallop GI: soft, non-tender; bowel sounds normal; no masses,  no organomegaly Extremities: extremities normal, atraumatic, no cyanosis or edema Neurologic: She is awake, alert. Oriented 3. No focal neurological deficits are noted.  Lab Results:  Basic Metabolic Panel:  Recent Labs Lab 01/25/15 1600 01/26/15 0550 01/27/15 0425  NA 138 139 137  K 4.0 3.9 3.7  CL 106 107 103  CO2 25 28 29   GLUCOSE 96 80 83  BUN 15 13 15   CREATININE 0.67 0.73 0.65  CALCIUM 9.4 9.1 9.0   Liver Function Tests:  Recent Labs Lab 01/25/15 1600  AST 16  ALT 14  ALKPHOS 70  BILITOT 0.5  PROT 6.0*  ALBUMIN 3.1*    Recent Labs Lab 01/25/15 1600  LIPASE 17*   CBC:  Recent Labs Lab 01/25/15 1600 01/26/15 0550 01/27/15 0425  WBC 6.5 5.2 5.9  NEUTROABS 4.1  --   --   HGB 9.9* 9.4* 9.0*  HCT 33.3* 32.2* 30.3*  MCV 80.4 81.7 81.9  PLT 238 231 226   Cardiac Enzymes:  Recent Labs Lab 01/25/15 1940  TROPONINI 0.03     Studies/Results: Dg Esophagus  01/27/2015  CLINICAL DATA:  Dysphagia  EXAM: ESOPHOGRAM/BARIUM SWALLOW  TECHNIQUE: Single contrast examination was performed using nectar thick barium.  FLUOROSCOPY TIME:  Radiation Exposure Index (as provided by the fluoroscopic device): 10.8 mGy  COMPARISON:  None.  FINDINGS: Limited examination secondary to limited patient mobility. The examination was performed in the semi-upright position. There was normal pharyngeal anatomy. Contrast flowed freely through the esophagus without evidence of a mass. There was normal esophageal mucosa without evidence of irregularity or ulceration. There are tertiary contractions of the esophagus intermittently. There is mild gastroesophageal reflux. There is a persistent narrowing at the esophagogastric junction  which restricts the passage of a 13 mm barium tablet concerning for a mild stricture. No definite hiatal hernia was demonstrated.  IMPRESSION: 1. Persistent narrowing at the esophagogastric junction which restricts the passage of a 13 mm barium tablet concerning for a mild stricture. 2. Mild gastroesophageal reflux. 3. Mild intermittent tertiary contractions of the esophagus as can be seen with spasm.   Electronically Signed   By: Elige KoHetal  Patel   On: 01/27/2015 11:15   Dg Swallowing Func-speech Pathology  01/27/2015    Objective Swallowing Evaluation:   Modified Barium Swallow Patient Details  Name: April Mccoy MRN: [id number] Date of Birth: 08/10/1929  Today's Date: 01/27/2015 Time: SLP Start Time (ACUTE ONLY): 0915-SLP Stop Time (ACUTE ONLY): 0935 SLP Time Calculation (min) (ACUTE ONLY): 20 min  Past Medical History:  Past Medical History  Diagnosis Date  . Hypertension   . Stroke     with h/o dysphagia post stroke  . Thyroid disease   . Left shoulder pain     DJD  . UTI (lower urinary tract infection)     Enterobacter aerogenes  . COPD (chronic obstructive pulmonary disease)   . GERD (gastroesophageal reflux disease)   . Abnormality of gait   . Hard of hearing   . Anemia   . Depression   . Tobacco abuse   . Wheelchair bound   . Bowel obstruction   . Fracture of coccyx     history  . Shortness of breath dyspnea    Past Surgical History:  Past Surgical History  Procedure Laterality Date  . Hernia repair    . Hip fracture surgery    . Joint replacement      b/l hips  . Gastrectomy  In her 40s versus 50s.    for ulcer.  No details as to the extent of surgery.  . Abdominal hysterectomy    . Cataract extraction     HPI:  Other Pertinent Information: 81 y.o. female with a history of COPD, HTN  CVA hx, Hypothyroid admitted after 3-4 days of increased nausea and  vomiting and SOB after eating. CXR no acute osseous abnormality. MBS  08/14/14 with intermittent silent aspiration with thin and nectar and  ocassional sensed  aspiration with weak/ineffective cough. Dys 3/nectar  liquids recommended.  No Data Recorded  Assessment / Plan / Recommendation CHL IP CLINICAL IMPRESSIONS 01/27/2015  Therapy Diagnosis Mild oral phase dysphagia;Mild pharyngeal phase  dysphagia;Moderate pharyngeal phase dysphagia  Clinical Impression Pt continues with chronic dysphagia diagnosed 2015.  She exhibited mild oral and mild-moderated sensory based dysphagia with  delayed swallow initiation to valleculae and pyrirform sinuses. Silent  aspiration of thin liquids in pyriform sinuses prematurely. Therapeutic  chin tuck was not effective over multiple trials. No significant residue  present. Recommend pt continue with nectar thick liquids (pt stated she  was drinking thin liquid prior to admission) and  Dys 3 diet texture with  continued ST intervention.       CHL IP TREATMENT RECOMMENDATION 01/27/2015  Treatment Recommendations Therapy as outlined in treatment plan below     CHL IP DIET RECOMMENDATION 01/27/2015  SLP Diet Recommendations Nectar;Dysphagia 3 (Mech soft)  Liquid Administration via (None)  Medication Administration Whole meds with puree  Compensations Slow rate;Small sips/bites  Postural Changes and/or Swallow Maneuvers (None)     CHL IP OTHER RECOMMENDATIONS 01/27/2015  Recommended Consults (None)  Oral Care Recommendations Oral care BID  Other Recommendations (None)     CHL IP FOLLOW UP RECOMMENDATIONS 08/15/2014  Follow up Recommendations Home health SLP     CHL IP FREQUENCY AND DURATION 01/27/2015  Speech Therapy Frequency (ACUTE ONLY) min 2x/week  Treatment Duration 2 weeks     Pertinent Vitals/Pain none    SLP Swallow Goals No flowsheet data found.  No flowsheet data found.    CHL IP REASON FOR REFERRAL 01/27/2015  Reason for Referral Objectively evaluate swallowing function     CHL IP ORAL PHASE 01/27/2015  Lips (None)  Tongue (None)  Mucous membranes (None)  Nutritional status (None)  Other (None)  Oxygen therapy (None)  Oral Phase Impaired   Oral - Pudding Teaspoon (None)  Oral - Pudding Cup (None)  Oral - Honey Teaspoon (None)  Oral - Honey Cup (None)  Oral - Honey Syringe (None)  Oral - Nectar Teaspoon (None)  Oral - Nectar Cup (None)  Oral - Nectar Straw (None)  Oral - Nectar Syringe (None)  Oral - Ice Chips (None)  Oral - Thin Teaspoon (None)  Oral - Thin Cup (None)  Oral - Thin Straw (None)  Oral - Thin Syringe (None)  Oral - Puree (None)  Oral - Mechanical Soft (None)  Oral - Regular (None)  Oral - Multi-consistency (None)  Oral - Pill (None)  Oral Phase - Comment (None)      CHL IP PHARYNGEAL PHASE 01/27/2015  Pharyngeal Phase Impaired  Pharyngeal - Pudding Teaspoon (None)  Penetration/Aspiration details (pudding teaspoon) (None)  Pharyngeal - Pudding Cup (None)  Penetration/Aspiration details (pudding cup) (None)  Pharyngeal - Honey Teaspoon (None)  Penetration/Aspiration details (honey teaspoon) (None)  Pharyngeal - Honey Cup (None)  Penetration/Aspiration details (honey cup) (None)  Pharyngeal - Honey Syringe (None)  Penetration/Aspiration details (honey syringe) (None)  Pharyngeal - Nectar Teaspoon (None)  Penetration/Aspiration details (nectar teaspoon) (None)  Pharyngeal - Nectar Cup (None)  Penetration/Aspiration details (nectar cup) (None)  Pharyngeal - Nectar Straw (None)  Penetration/Aspiration details (nectar straw) (None)  Pharyngeal - Nectar Syringe (None)  Penetration/Aspiration details (nectar syringe) (None)  Pharyngeal - Ice Chips (None)  Penetration/Aspiration details (ice chips) (None)  Pharyngeal - Thin Teaspoon (None)  Penetration/Aspiration details (thin teaspoon) (None)  Pharyngeal - Thin Cup (None)  Penetration/Aspiration details (thin cup) (None)  Pharyngeal - Thin Straw (None)  Penetration/Aspiration details (thin straw) (None)  Pharyngeal - Thin Syringe (None)  Penetration/Aspiration details (thin syringe') (None)  Pharyngeal - Puree (None)  Penetration/Aspiration details (puree) (None)  Pharyngeal - Mechanical Soft  (None)  Penetration/Aspiration details (mechanical soft) (None)  Pharyngeal - Regular (None)  Penetration/Aspiration details (regular) (None)  Pharyngeal - Multi-consistency (None)  Penetration/Aspiration details (multi-consistency) (None)  Pharyngeal - Pill (None)  Penetration/Aspiration details (pill) (None)  Pharyngeal Comment (None)      CHL IP CERVICAL ESOPHAGEAL PHASE 01/27/2015  Cervical Esophageal Phase WFL  Pudding Teaspoon (None)  Pudding Cup (None)  Honey Teaspoon (None)  Honey Cup (None)  Honey Straw (None)  Nectar Teaspoon (None)  Nectar Cup (None)  Nectar Straw (None)  Nectar Sippy Cup (None)  Thin Teaspoon (None)  Thin Cup (None)  Thin Straw (None)  Thin Sippy Cup (None)  Cervical Esophageal Comment (None)    CHL IP GO 01/27/2015  Functional Assessment Tool Used (No Data)  Functional Limitations Swallowing  Swallow Current Status (Z6109) CL  Swallow Goal Status (U0454) CK  Swallow Discharge Status (U9811) (None)  Motor Speech Current Status (B1478) (None)  Motor Speech Goal Status (G9562) (None)  Motor Speech Goal Status (Z3086) (None)  Spoken Language Comprehension Current Status (V7846) (None)  Spoken Language Comprehension Goal Status (N6295) (None)  Spoken Language Comprehension Discharge Status [phone]) (None)  Spoken Language Expression Current Status [phone]) (None)  Spoken Language Expression Goal Status (U2725) (None)  Spoken Language Expression Discharge Status [phone]) (None)  Attention Current Status (I3474) (None)  Attention Goal Status (Q5956) (None)  Attention Discharge Status (L8756) (None)  Memory Current Status (E3329) (None)  Memory Goal Status (J1884) (None)  Memory Discharge Status (Z6606) (None)  Voice Current Status (T0160) (None)  Voice Goal Status (F0932) (None)  Voice Discharge Status (T5573) (None)  Other Speech-Language Pathology Functional Limitation [phone]) (None)  Other Speech-Language Pathology Functional Limitation Goal Status (K2706)  (None)  Other Speech-Language  Pathology Functional Limitation Discharge Status  [phone]) (None)           Royce Macadamia 01/27/2015, 3:03 PM\  Breck Coons Litaker M.Ed CCC-SLP Pager [phone]       Medications:  Scheduled: . antiseptic oral rinse  7 mL Mouth Rinse BID  . aspirin EC  81 mg Oral Daily  . benazepril  5 mg Oral Daily  . calcium carbonate  1 tablet Oral BID WC  . escitalopram  5 mg Oral Daily  . ipratropium-albuterol  3 mL Nebulization Daily  . iron polysaccharides  150 mg Oral Daily  . levothyroxine  25 mcg Oral QAC breakfast  . pantoprazole  40 mg Oral Daily  . sodium chloride  3 mL Intravenous Q12H  . sodium chloride  3 mL Intravenous Q12H   Continuous: . sodium chloride 20 mL/hr at 01/27/15 1740   HYW:VPXTGG chloride, acetaminophen **OR** acetaminophen, albuterol, alum & mag hydroxide-simeth, HYDROmorphone (DILAUDID) injection, ondansetron **OR** ondansetron (ZOFRAN) IV, oxyCODONE, RESOURCE THICKENUP CLEAR, sodium chloride  Assessment/Plan:  Principal Problem:   Hypoxia Active Problems:   History of stroke   Hypertension   Thyroid disease   COPD (chronic obstructive pulmonary disease)   Dysphagia   Tobacco abuse   Nausea and vomiting   SOB (shortness of breath)   Hypoxemia    Acute respiratory failure with hypoxia CT chest was negative for PE. COPD is likely responsible for her hypoxia. Aspiration is also possible, though no infiltrates were noted on imaging films. She will likely need to be on home oxygen. Continue to monitor for now.  Nausea and vomiting Appears to have resolved. No clear etiology found. Has been tolerating liquids orally. Abdomen is benign.  Dysphagia. She does have a known history of oropharyngeal dysphagia. She has been evaluated by speech therapy in the past. She underwent a swallow evaluation in December 2015. She was placed on a dysphagia 3 diet with nectar thick liquids. A barium swallow showed narrowing at the GE junction. Gastroenterology has been  consulted. Plan is for EGD today. Patient is noted to be on Fosamax. GI is recommending that this may not be an appropriate medication for her as it has GI side effects. We will  wait for EGD findings and decide at that time.  History of COPD Does not appear to have any exacerbation at this time. Continue current treatment. Hypoxia has been documented. Home oxygen to be ordered.  History of stroke Stable. Continue aspirin. Apparently she has impaired mobility at baseline. She did tell me that she sometimes gets up with a walker. PT and OT has seen and home health as recommended. This has been ordered.  History of essential hypertension. Continue with her antihypertensive regimen  History of hypothyroidism Continue levothyroxin.  DVT Prophylaxis: Lovenox    Code Status: Full code  Family Communication: Discussed with the patient. Discussed with her daughter over the phone.  Disposition Plan: EGD today. Home health has been ordered. Consider discontinuing Fosamax based on EGD report. Possible discharge tomorrow.    [facility]  [facility] Pager [phone] 01/28/2015, 7:24 AM  If 7PM-7AM, please contact night-coverage at [email or web address], password [facility]

## 2015-01-28 NOTE — Care Management Note (Addendum)
Case Management Note  Patient Details  Name: April Mccoy MRN: [id number] Date of Birth: 08/06/1929  Subjective/Objective:   Pt admitted with Hypoxia                 Action/Plan:  CM will continue to monitor to determine disposition needs   Expected Discharge Date:                  Expected Discharge Plan:  Home w Home Health Services  In-House Referral:     Discharge planning Services  CM Consult  Post Acute Care Choice:    Choice offered to:  Patient, Adult Children  DME Arranged:   Supplemental Oxygen, 1/2 lap tray for wheelchair DME Agency:   [facility]  [facility]  [facility]  [facility]  Status of Service:  Completed, signed off  Medicare Important Message Given:  Yes Date Medicare IM Given:   01/28/15 Medicare IM give by:   Raynald BlendSamantha Ma Munoz Date Additional Medicare IM Given:    Additional Medicare Important Message give by:     If discussed at Long Length of Stay Meetings, dates discussed:    Additional Comments: 01/28/15 1448 Raynald BlendSamantha Kyrstyn Greear, RN, BSN [phone].  CM contacted admission to determine if pt also had [facility] medicare; [name].  No additional CM needs at this time.   01/28/15  Raynald BlendSamantha Sophie Tamez, RN, BSN [phone].  CM communicated with PT post assessment of pt.  PT recommends 24 hour supervision, daughter and pt refused SNF assessment in multiple conversations, stated pt wanted to go home with help from daughter, son and aide from CAP program.  Daughter stated she understood the recommendation and the concern however she felt that between family and CAPS program pt would be covered for supervision needs.  [facility], daughter agreed.  [facility]  [facility] contacted, referral for oxygen and 1/2 lap tray for wheelchair accepted.  NO additional CM needs at this time.    April Mccoy, April Mccoy  S, RN 01/28/2015, 2:23 PM

## 2015-01-29 ENCOUNTER — Encounter (HOSPITAL_COMMUNITY): Payer: Self-pay | Admitting: Internal Medicine

## 2015-01-29 DIAGNOSIS — I10 Essential (primary) hypertension: Secondary | ICD-10-CM | POA: Diagnosis not present

## 2015-01-29 DIAGNOSIS — R1310 Dysphagia, unspecified: Secondary | ICD-10-CM | POA: Diagnosis not present

## 2015-01-29 DIAGNOSIS — K224 Dyskinesia of esophagus: Secondary | ICD-10-CM

## 2015-01-29 DIAGNOSIS — K2210 Ulcer of esophagus without bleeding: Secondary | ICD-10-CM | POA: Diagnosis not present

## 2015-01-29 DIAGNOSIS — K269 Duodenal ulcer, unspecified as acute or chronic, without hemorrhage or perforation: Secondary | ICD-10-CM

## 2015-01-29 DIAGNOSIS — R1110 Vomiting, unspecified: Secondary | ICD-10-CM | POA: Diagnosis not present

## 2015-01-29 DIAGNOSIS — J449 Chronic obstructive pulmonary disease, unspecified: Secondary | ICD-10-CM | POA: Diagnosis not present

## 2015-01-29 LAB — BASIC METABOLIC PANEL
Anion gap: 6 (ref 5–15)
BUN: 14 mg/dL (ref 6–20)
CALCIUM: 9.6 mg/dL (ref 8.9–10.3)
CO2: 28 mmol/L (ref 22–32)
CREATININE: 0.58 mg/dL (ref 0.44–1.00)
Chloride: 105 mmol/L (ref 101–111)
GFR calc Af Amer: >60 mL/min (ref >60)
Glucose, Bld: 90 mg/dL (ref 65–99)
Potassium: 4 mmol/L (ref 3.5–5.1)
Sodium: 139 mmol/L (ref 135–145)

## 2015-01-29 LAB — CBC
HCT: 32.5 % — ABNORMAL LOW (ref 36.0–46.0)
HEMOGLOBIN: 9.4 g/dL — AB (ref 12.0–15.0)
MCH: 23.7 pg — AB (ref 26.0–34.0)
MCHC: 28.9 g/dL — ABNORMAL LOW (ref 30.0–36.0)
MCV: 81.9 fL (ref 78.0–100.0)
Platelets: 246 10*3/uL (ref 150–400)
RBC: 3.97 MIL/uL (ref 3.87–5.11)
RDW: 13.9 % (ref 11.5–15.5)
WBC: 5.3 10*3/uL (ref 4.0–10.5)

## 2015-01-29 MED ORDER — ASPIRIN EC 81 MG PO TBEC: 81.0000 mg | DELAYED_RELEASE_TABLET | Freq: Every day | ORAL | Status: AC

## 2015-01-29 MED ORDER — SUCRALFATE 1 GM/10ML PO SUSP: 1.0000 g | Freq: Three times a day (TID) | ORAL | Status: DC

## 2015-01-29 MED ORDER — POLYSACCHARIDE IRON COMPLEX 150 MG PO CAPS: 150.0000 mg | ORAL_CAPSULE | Freq: Every day | ORAL | Status: DC

## 2015-01-29 MED ORDER — ALBUTEROL SULFATE (2.5 MG/3ML) 0.083% IN NEBU: 2.5000 mg | INHALATION_SOLUTION | Freq: Four times a day (QID) | RESPIRATORY_TRACT | Status: AC | PRN

## 2015-01-29 NOTE — Progress Notes (Signed)
Daily Rounding Note  01/29/2015, 9:20 AM    SUBJECTIVE:       Says swallowing is better.  No more odynophagia. Less dysphagia.   OBJECTIVE:         Vital signs in last 24 hours:    Temp:  [97.8 F (36.6 C)-98.9 F (37.2 C)] 98.9 F (37.2 C) (05/25 0446) Pulse Rate:  [65-92] 68 (05/25 0446) Resp:  [10-18] 18 (05/25 0446) BP: (101-199)/(50-109) 136/69 mmHg (05/25 0446) SpO2:  [90 %-98 %] 97 % (05/25 0446) Last BM Date: 01/25/15 Filed Weights   01/25/15 2317  Weight: 102 lb 6.4 oz (46.448 kg)   General: pleasant, frail.    Heart: rrr Chest: clear bil.  No cought or dyspnea Abdomen: soft, NT, ND.  Active BS  Extremities: no CCE Neuro/Psych:  Pleasant, cooperative.  HOH.   Intake/Output from previous day: 05/24 0701 - 05/25 0700 In: 240 [P.O.:240] Out: -   Intake/Output this shift: Total I/O In: 120 [P.O.:120] Out: -   Lab Results:  Recent Labs  01/27/15 0425 01/29/15 0520  WBC 5.9 5.3  HGB 9.0* 9.4*  HCT 30.3* 32.5*  PLT 226 246   BMET  Recent Labs  01/27/15 0425 01/29/15 0520  NA 137 139  K 3.7 4.0  CL 103 105  CO2 29 28  GLUCOSE 83 90  BUN 15 14  CREATININE 0.65 0.58  CALCIUM 9.0 9.6    Studies/Results: Dg Esophagus 01/27/2015   CLINICAL DATA:  Dysphagia  EXAM: ESOPHOGRAM/BARIUM SWALLOW  TECHNIQUE: Single contrast examination was performed using nectar thick barium.  FLUOROSCOPY TIME:  Radiation Exposure Index (as provided by the fluoroscopic device): 10.8 mGy  COMPARISON:  None.  FINDINGS: Limited examination secondary to limited patient mobility. The examination was performed in the semi-upright position. There was normal pharyngeal anatomy. Contrast flowed freely through the esophagus without evidence of a mass. There was normal esophageal mucosa without evidence of irregularity or ulceration. There are tertiary contractions of the esophagus intermittently. There is mild gastroesophageal  reflux. There is a persistent narrowing at the esophagogastric junction which restricts the passage of a 13 mm barium tablet concerning for a mild stricture. No definite hiatal hernia was demonstrated.  IMPRESSION: 1. Persistent narrowing at the esophagogastric junction which restricts the passage of a 13 mm barium tablet concerning for a mild stricture. 2. Mild gastroesophageal reflux. 3. Mild intermittent tertiary contractions of the esophagus as can be seen with spasm.   Electronically Signed   By: Elige Ko   On: 01/27/2015 11:15   Dg Swallowing Func-speech Pathology 01/27/2015    Objective Swallowing Evaluation:   Modified Barium Swallow  MBS  08/14/14 with intermittent silent aspiration with thin and nectar and  ocassional sensed aspiration with weak/ineffective cough. Dys 3/nectar  liquids recommended.  No Data Recorded  Assessment / Plan / Recommendation CHL IP CLINICAL IMPRESSIONS 01/27/2015  Therapy Diagnosis Mild oral phase dysphagia;Mild pharyngeal phase  dysphagia;Moderate pharyngeal phase dysphagia  Clinical Impression Pt continues with chronic dysphagia diagnosed 2015.  She exhibited mild oral and mild-moderated sensory based dysphagia with  delayed swallow initiation to valleculae and pyrirform sinuses. Silent  aspiration of thin liquids in pyriform sinuses prematurely. Therapeutic  chin tuck was not effective over multiple trials. No significant residue  present. Recommend pt continue with nectar thick liquids (pt stated she  was drinking thin liquid prior to admission) and Dys 3 diet texture with  continued ST intervention.  CHL IP TREATMENT RECOMMENDATION 01/27/2015  Treatment Recommendations Therapy as outlined in treatment plan below     CH[facility] IP DIET RECOMMENDATION 01/27/2015  SLP Diet Recommendations Nectar;Dysphagia 3 (Mech soft)  Liquid Administration via (None)  Medication Administration Whole meds with puree  Compensations Slow rate;Small sips/bites   Breck CoonsLisa Willis Litaker M.Ed CCC-SLP  Pager [phone]      Scheduled Meds: . antiseptic oral rinse  7 mL Mouth Rinse BID  . aspirin EC  81 mg Oral Daily  . benazepril  5 mg Oral Daily  . calcium carbonate  1 tablet Oral BID WC  . escitalopram  5 mg Oral Daily  . ipratropium-albuterol  3 mL Nebulization Daily  . iron polysaccharides  150 mg Oral Daily  . levothyroxine  25 mcg Oral QAC breakfast  . pantoprazole  40 mg Oral Daily  . sodium chloride  3 mL Intravenous Q12H  . sodium chloride  3 mL Intravenous Q12H  . sucralfate  1 g Oral TID WC & HS   Continuous Infusions: . sodium chloride 20 mL (01/28/15 1007)   PRN Meds:.sodium chloride, acetaminophen **OR** acetaminophen, albuterol, alum & mag hydroxide-simeth, HYDROmorphone (DILAUDID) injection, ondansetron **OR** ondansetron (ZOFRAN) IV, oxyCODONE, RESOURCE THICKENUP CLEAR, sodium chloride   ASSESMENT:   *  Dysphagia.  Esophagram showed narrowing in distal esophagus and esophageal dysmotility.  EGD 5/24: ulcerative distal esophagitis, HH.  No stricture.    PLAN   *  Ok to discharge home from GI perspective.  Prn GI f/up with Dr Rhea BeltonPyrtle or GI APP  *  Recs per Dr Lauro FranklinPyrtle's EGD note: 1. Anti-reflux precautions, remain upright for 60 min after eating 2. Daily PPI 3. Crush pills and take with applesauce or pudding whenever possible. Agree with avoiding Fosamax. 4. Await biopsy results 5. Liquid sucralfate 1 g before meals and bedtime may help symptoms. 6. Esophageal dysmotility, with very limited treatment options, with possible element of esophageal spasm is the most likely cause of dysphagia. Helping heal ulcers will also improve symptoms.   Jennye MoccasinSarah Jaxxson Cavanah  01/29/2015, 9:20 AM Pager: [phone]

## 2015-01-29 NOTE — Discharge Summary (Signed)
Physician Discharge Summary  April Mccoy:[id number] DOB: 12-15-1928 DOA: 01/25/2015  PCP: Ailene Ravel, MD  Admit date: 01/25/2015 Discharge date: 01/29/2015  Time spent: 35 minutes  Recommendations for Outpatient Follow-up:  1. Follow up with PC in 2 week check a CBC. 2. Follow up with GI in 2-4 weeks.  Discharge Diagnoses:  Principal Problem:   Hypoxia Active Problems:   History of stroke   Hypertension   Thyroid disease   COPD (chronic obstructive pulmonary disease)   Dysphagia   Tobacco abuse   Nausea and vomiting   SOB (shortness of breath)   Hypoxemia   Esophageal ulceration   Discharge Condition: stable  Diet recommendation: Heart healthy  Filed Weights   01/25/15 2317  Weight: 46.448 kg (102 lb 6.4 oz)    History of present illness:  81 year old Caucasian female with a past medical history of COPD, hypertension, history of stroke, presented with 3-4 day history of nausea and vomiting. In the emergency department she was found to be hypoxic, saturating about 82% on room air. CT angiogram was negative for PE. She also mentioned difficulty with swallowing and feeling of food getting stuck in her chest. She was admitted for further management.  Hospital Course:  Acute respiratory failure with hypoxia possible due to aspiration: - CT chest was negative for PE. COPD is likely responsible for her hypoxia. - will likely need to be on home oxygen  Nausea and vomiting: - likely due to esophageal dysmotility seen on EGD.  Dysphagia: - with a known history of oropharyngeal dysphagia. - in December 2015. She was placed on a dysphagia 3 diet with nectar thick liquids - EGD performed on 5.24.2016 that showed multiple ulcer in the 3rd portion of the esophagus, hiatal hernia and ulcer ont he 2nd part of duodenum. - cont on PPI and sulcralfate added. Biopsy result to be followed as an outpatient. - Esophageal dysmotility, with very limited treatment options, with  possible element of esophageal spasm is the most likely cause of dysphagia  History of COPD Does not appear to have any exacerbation at this time.  Cont current therapy.  Essential HTN: - cont current regimen.  Hypothyroidism: - cont current therapy.  Procedures:  EGD  Consultations:  GI  Discharge Exam: Filed Vitals:   01/29/15 0446  BP: 136/69  Pulse: 68  Temp: 98.9 F (37.2 C)  Resp: 18    General: A&O x3 Cardiovascular: RRR Respiratory: good air movement CTA B/L  Discharge Instructions   Discharge Instructions    Diet - low sodium heart healthy    Complete by:  As directed      Increase activity slowly    Complete by:  As directed           Current Discharge Medication List    START taking these medications   Details  albuterol (PROVENTIL) (2.5 MG/3ML) 0.083% nebulizer solution Take 3 mLs (2.5 mg total) by nebulization every 6 (six) hours as needed for wheezing or shortness of breath. Qty: 75 mL, Refills: 12    iron polysaccharides (NIFEREX) 150 MG capsule Take 1 capsule (150 mg total) by mouth daily. Qty: 30 capsule, Refills: 3    sucralfate (CARAFATE) 1 GM/10ML suspension Take 10 mLs (1 g total) by mouth 4 (four) times daily -  with meals and at bedtime. Qty: 420 mL, Refills: 0      CONTINUE these medications which have CHANGED   Details  aspirin EC 81 MG tablet Take 1 tablet (81 mg  total) by mouth daily.      CONTINUE these medications which have NOT CHANGED   Details  acetaminophen (TYLENOL) 325 MG tablet Take 650 mg by mouth every 6 (six) hours as needed for mild pain.    alendronate (FOSAMAX) 70 MG tablet Take 70 mg by mouth once a week.  Refills: 4    benazepril (LOTENSIN) 10 MG tablet Take 5 mg by mouth daily.     calcium carbonate (OS-CAL - DOSED IN MG OF ELEMENTAL CALCIUM) 1250 MG tablet Take 1 tablet (500 mg of elemental calcium total) by mouth 2 (two) times daily with a meal. Qty: 60 tablet, Refills: 1    escitalopram  (LEXAPRO) 5 MG tablet Take 5 mg by mouth daily.    ipratropium-albuterol (DUONEB) 0.5-2.5 (3) MG/3ML SOLN Inhale 3 mLs into the lungs daily.  Refills: 5    levothyroxine (SYNTHROID, LEVOTHROID) 25 MCG tablet Take 25 mcg by mouth daily before breakfast.    omeprazole (PRILOSEC) 40 MG capsule Take 40 mg by mouth daily.    Vitamin D, Ergocalciferol, (DRISDOL) 50000 UNITS CAPS capsule Take 1 capsule (50,000 Units total) by mouth every 7 (seven) days. Qty: 8 capsule, Refills: 0      STOP taking these medications     nicotine (NICODERM CQ - DOSED IN MG/24 HOURS) 21 mg/24hr patch        No Known Allergies Follow-up Information    Follow up with [facility].   Why:  Oxygen and 1/2 lap tray for wheelchair   Contact information:   [address] [place] [address] [phone]       Follow up with [facility].   Why:  Acupuncturistccupational Therapist, Physical Therapist   Contact information:   [address] [place] [address] [phone]       [facility].   Specialty:  Family Medicine   Why:  hospital follow up   Contact information:   Dr. Burnell BlanksMaura Hamrick [address] [place] [address] [phone]       Follow up with Beverley FiedlerPYRTLE, JAY M, MD In 4 weeks.   Specialty:  Gastroenterology   Contact information:   [address] [place] [address] [phone]        The results of significant diagnostics from this hospitalization (including imaging, microbiology, ancillary and laboratory) are listed below for reference.    Significant Diagnostic Studies: Dg Chest 2 View  01/25/2015   CLINICAL DATA:  Patient with vomiting. Feels as if food is getting stuck. Complaining of diarrhea and cough.  EXAM: CHEST  2 VIEW  COMPARISON:  Rib radiographs 08/13/2014; chest CT 08/13/2014  FINDINGS: Stable cardiac and mediastinal contours with tortuosity of the thoracic aorta. Lungs are  hyperexpanded. No consolidative pulmonary opacities. No pleural effusion or pneumothorax. Mid thoracic spine degenerative changes.  IMPRESSION: No acute osseous abnormality.   Electronically Signed   By: Annia Beltrew  Davis M.D.   On: 01/25/2015 17:10   Ct Angio Chest Pe W/cm &/or Wo Cm  01/25/2015   CLINICAL DATA:  Hypoxia, cough  EXAM: CT ANGIOGRAPHY CHEST WITH CONTRAST  TECHNIQUE: Multidetector CT imaging of the chest was performed using the standard protocol during bolus administration of intravenous contrast. Multiplanar CT image reconstructions and MIPs were obtained to evaluate the vascular anatomy.  CONTRAST:  100mL OMNIPAQUE IOHEXOL 350 MG/ML SOLN  COMPARISON:  Chest radiographs dated 01/25/2015. CTA chest dated 08/13/2014.  FINDINGS:  No evidence of pulmonary embolism.  Although not tailored for evaluation of the aorta, there is a stable penetrating atherosclerotic ulcer along the left lateral aspect of the aortic arch just prior to the proximal descending thoracic aorta (series 406/image 96).  Mediastinum/Nodes: The heart is top-normal in size. No pericardial effusion.  Coronary atherosclerosis.  Atherosclerotic calcifications of the aortic arch.  No suspicious mediastinal, hilar, or axillary lymphadenopathy.  Lungs/Pleura: No suspicious pulmonary nodules.  Mild centrilobular emphysematous changes.  Dependent atelectasis in the bilateral lower lobes, left greater than right.  Small left and trace right pleural effusions.  No pneumothorax.  Upper abdomen: Visualized upper abdomen is unremarkable.  Musculoskeletal: Mild superior endplate changes of two lower thoracic vertebral bodies (series 403/image 65), chronic.  Review of the MIP images confirms the above findings.  IMPRESSION: No evidence of pulmonary embolism.  Stable penetrating atherosclerotic ulcer along the left lateral aspect of the aortic arch.  Mild centrilobular emphysematous changes.  Small left and trace right pleural effusions. Associated  dependent atelectasis in the bilateral lower lobes.   Electronically Signed   By: Charline Bills M.D.   On: 01/25/2015 21:07   Dg Esophagus  01/27/2015   CLINICAL DATA:  Dysphagia  EXAM: ESOPHOGRAM/BARIUM SWALLOW  TECHNIQUE: Single contrast examination was performed using nectar thick barium.  FLUOROSCOPY TIME:  Radiation Exposure Index (as provided by the fluoroscopic device): 10.8 mGy  COMPARISON:  None.  FINDINGS: Limited examination secondary to limited patient mobility. The examination was performed in the semi-upright position. There was normal pharyngeal anatomy. Contrast flowed freely through the esophagus without evidence of a mass. There was normal esophageal mucosa without evidence of irregularity or ulceration. There are tertiary contractions of the esophagus intermittently. There is mild gastroesophageal reflux. There is a persistent narrowing at the esophagogastric junction which restricts the passage of a 13 mm barium tablet concerning for a mild stricture. No definite hiatal hernia was demonstrated.  IMPRESSION: 1. Persistent narrowing at the esophagogastric junction which restricts the passage of a 13 mm barium tablet concerning for a mild stricture. 2. Mild gastroesophageal reflux. 3. Mild intermittent tertiary contractions of the esophagus as can be seen with spasm.   Electronically Signed   By: Elige Ko   On: 01/27/2015 11:15   Dg Swallowing Func-speech Pathology  01/27/2015    Objective Swallowing Evaluation:   Modified Barium Swallow Patient Details  Name: KAROLE OO MRN: [id number] Date of Birth: 05-22-1929  Today's Date: 01/27/2015 Time: SLP Start Time (ACUTE ONLY): 0915-SLP Stop Time (ACUTE ONLY): 0935 SLP Time Calculation (min) (ACUTE ONLY): 20 min  Past Medical History:  Past Medical History  Diagnosis Date  . Hypertension   . Stroke     with h/o dysphagia post stroke  . Thyroid disease   . Left shoulder pain     DJD  . UTI (lower urinary tract infection)     Enterobacter  aerogenes  . COPD (chronic obstructive pulmonary disease)   . GERD (gastroesophageal reflux disease)   . Abnormality of gait   . Hard of hearing   . Anemia   . Depression   . Tobacco abuse   . Wheelchair bound   . Bowel obstruction   . Fracture of coccyx     history  . Shortness of breath dyspnea    Past Surgical History:  Past Surgical History  Procedure Laterality Date  . Hernia repair    . Hip fracture surgery    . Joint replacement  b/l hips  . Gastrectomy  In her 40s versus 50s.    for ulcer.  No details as to the extent of surgery.  . Abdominal hysterectomy    . Cataract extraction     HPI:  Other Pertinent Information: 81 y.o. female with a history of COPD, HTN  CVA hx, Hypothyroid admitted after 3-4 days of increased nausea and  vomiting and SOB after eating. CXR no acute osseous abnormality. MBS  08/14/14 with intermittent silent aspiration with thin and nectar and  ocassional sensed aspiration with weak/ineffective cough. Dys 3/nectar  liquids recommended.  No Data Recorded  Assessment / Plan / Recommendation CHL IP CLINICAL IMPRESSIONS 01/27/2015  Therapy Diagnosis Mild oral phase dysphagia;Mild pharyngeal phase  dysphagia;Moderate pharyngeal phase dysphagia  Clinical Impression Pt continues with chronic dysphagia diagnosed 2015.  She exhibited mild oral and mild-moderated sensory based dysphagia with  delayed swallow initiation to valleculae and pyrirform sinuses. Silent  aspiration of thin liquids in pyriform sinuses prematurely. Therapeutic  chin tuck was not effective over multiple trials. No significant residue  present. Recommend pt continue with nectar thick liquids (pt stated she  was drinking thin liquid prior to admission) and Dys 3 diet texture with  continued ST intervention.       CHL IP TREATMENT RECOMMENDATION 01/27/2015  Treatment Recommendations Therapy as outlined in treatment plan below     CHL IP DIET RECOMMENDATION 01/27/2015  SLP Diet Recommendations Nectar;Dysphagia 3 (Mech soft)   Liquid Administration via (None)  Medication Administration Whole meds with puree  Compensations Slow rate;Small sips/bites  Postural Changes and/or Swallow Maneuvers (None)     CHL IP OTHER RECOMMENDATIONS 01/27/2015  Recommended Consults (None)  Oral Care Recommendations Oral care BID  Other Recommendations (None)     CHL IP FOLLOW UP RECOMMENDATIONS 08/15/2014  Follow up Recommendations Home health SLP     CHL IP FREQUENCY AND DURATION 01/27/2015  Speech Therapy Frequency (ACUTE ONLY) min 2x/week  Treatment Duration 2 weeks     Pertinent Vitals/Pain none    SLP Swallow Goals No flowsheet data found.  No flowsheet data found.    CHL IP REASON FOR REFERRAL 01/27/2015  Reason for Referral Objectively evaluate swallowing function     CHL IP ORAL PHASE 01/27/2015  Lips (None)  Tongue (None)  Mucous membranes (None)  Nutritional status (None)  Other (None)  Oxygen therapy (None)  Oral Phase Impaired  Oral - Pudding Teaspoon (None)  Oral - Pudding Cup (None)  Oral - Honey Teaspoon (None)  Oral - Honey Cup (None)  Oral - Honey Syringe (None)  Oral - Nectar Teaspoon (None)  Oral - Nectar Cup (None)  Oral - Nectar Straw (None)  Oral - Nectar Syringe (None)  Oral - Ice Chips (None)  Oral - Thin Teaspoon (None)  Oral - Thin Cup (None)  Oral - Thin Straw (None)  Oral - Thin Syringe (None)  Oral - Puree (None)  Oral - Mechanical Soft (None)  Oral - Regular (None)  Oral - Multi-consistency (None)  Oral - Pill (None)  Oral Phase - Comment (None)      CHL IP PHARYNGEAL PHASE 01/27/2015  Pharyngeal Phase Impaired  Pharyngeal - Pudding Teaspoon (None)  Penetration/Aspiration details (pudding teaspoon) (None)  Pharyngeal - Pudding Cup (None)  Penetration/Aspiration details (pudding cup) (None)  Pharyngeal - Honey Teaspoon (None)  Penetration/Aspiration details (honey teaspoon) (None)  Pharyngeal - Honey Cup (None)  Penetration/Aspiration details (honey cup) (None)  Pharyngeal - Honey Syringe (None)  Penetration/Aspiration details (honey  syringe) (None)  Pharyngeal - Nectar Teaspoon (None)  Penetration/Aspiration details (nectar teaspoon) (None)  Pharyngeal - Nectar Cup (None)  Penetration/Aspiration details (nectar cup) (None)  Pharyngeal - Nectar Straw (None)  Penetration/Aspiration details (nectar straw) (None)  Pharyngeal - Nectar Syringe (None)  Penetration/Aspiration details (nectar syringe) (None)  Pharyngeal - Ice Chips (None)  Penetration/Aspiration details (ice chips) (None)  Pharyngeal - Thin Teaspoon (None)  Penetration/Aspiration details (thin teaspoon) (None)  Pharyngeal - Thin Cup (None)  Penetration/Aspiration details (thin cup) (None)  Pharyngeal - Thin Straw (None)  Penetration/Aspiration details (thin straw) (None)  Pharyngeal - Thin Syringe (None)  Penetration/Aspiration details (thin syringe') (None)  Pharyngeal - Puree (None)  Penetration/Aspiration details (puree) (None)  Pharyngeal - Mechanical Soft (None)  Penetration/Aspiration details (mechanical soft) (None)  Pharyngeal - Regular (None)  Penetration/Aspiration details (regular) (None)  Pharyngeal - Multi-consistency (None)  Penetration/Aspiration details (multi-consistency) (None)  Pharyngeal - Pill (None)  Penetration/Aspiration details (pill) (None)  Pharyngeal Comment (None)      CHL IP CERVICAL ESOPHAGEAL PHASE 01/27/2015  Cervical Esophageal Phase WFL  Pudding Teaspoon (None)  Pudding Cup (None)  Honey Teaspoon (None)  Honey Cup (None)  Honey Straw (None)  Nectar Teaspoon (None)  Nectar Cup (None)  Nectar Straw (None)  Nectar Sippy Cup (None)  Thin Teaspoon (None)  Thin Cup (None)  Thin Straw (None)  Thin Sippy Cup (None)  Cervical Esophageal Comment (None)    CHL IP GO 01/27/2015  Functional Assessment Tool Used (No Data)  Functional Limitations Swallowing  Swallow Current Status (Z6109) CL  Swallow Goal Status (U0454) CK  Swallow Discharge Status (U9811) (None)  Motor Speech Current Status (B1478) (None)  Motor Speech Goal Status (G9562) (None)  Motor Speech Goal  Status (Z3086) (None)  Spoken Language Comprehension Current Status (V7846) (None)  Spoken Language Comprehension Goal Status (N6295) (None)  Spoken Language Comprehension Discharge Status (M8413) (None)  Spoken Language Expression Current Status (K4401) (None)  Spoken Language Expression Goal Status (U2725) (None)  Spoken Language Expression Discharge Status [phone]) (None)  Attention Current Status (I3474) (None)  Attention Goal Status (Q5956) (None)  Attention Discharge Status (L8756) (None)  Memory Current Status (E3329) (None)  Memory Goal Status (J1884) (None)  Memory Discharge Status (Z6606) (None)  Voice Current Status (T0160) (None)  Voice Goal Status (F0932) (None)  Voice Discharge Status (T5573) (None)  Other Speech-Language Pathology Functional Limitation (U2025) (None)  Other Speech-Language Pathology Functional Limitation Goal Status (K2706)  (None)  Other Speech-Language Pathology Functional Limitation Discharge Status  [phone]) (None)           Royce Macadamia 01/27/2015, 3:03 PM\  Breck Coons Litaker M.Ed CCC-SLP Pager [phone]       Microbiology: No results found for this or any previous visit (from the past 240 hour(s)).   Labs: Basic Metabolic Panel:  Recent Labs Lab 01/25/15 1600 01/26/15 0550 01/27/15 0425 01/29/15 0520  NA 138 139 137 139  K 4.0 3.9 3.7 4.0  CL 106 107 103 105  CO2 GLUCOSE 96 80 83 90  BUN CREATININE 0.67 0.73 0.65 0.58  CALCIUM 9.4 9.1 9.0 9.6   Liver Function Tests:  Recent Labs Lab 01/25/15 1600  AST 16  ALT 14  ALKPHOS 70  BILITOT 0.5  PROT 6.0*  ALBUMIN 3.1*    Recent Labs Lab 01/25/15 1600  LIPASE 17*   No results for input(s): AMMONIA in the last 168 hours. CBC:  Recent Labs Lab 01/25/15  1600 01/26/15 0550 01/27/15 0425 01/29/15 0520  WBC 6.5 5.2 5.9 5.3  NEUTROABS 4.1  --   --   --   HGB 9.9* 9.4* 9.0* 9.4*  HCT 33.3* 32.2* 30.3* 32.5*  MCV 80.4 81.7 81.9 81.9  PLT 238 231 226 246    Cardiac Enzymes:  Recent Labs Lab 01/25/15 1940  TROPONINI 0.03   BNP: BNP (last 3 results) No results for input(s): BNP in the last 8760 hours.  ProBNP (last 3 results)  Recent Labs  08/13/14 0806  PROBNP 230.3    CBG: No results for input(s): GLUCAP in the last 168 hours.     Signed:  Marinda Elk  [facility] 01/29/2015, 9:51 AM

## 2015-01-31 DIAGNOSIS — Z9981 Dependence on supplemental oxygen: Secondary | ICD-10-CM | POA: Diagnosis not present

## 2015-01-31 DIAGNOSIS — Z720 Tobacco use: Secondary | ICD-10-CM | POA: Diagnosis not present

## 2015-01-31 DIAGNOSIS — I10 Essential (primary) hypertension: Secondary | ICD-10-CM | POA: Diagnosis not present

## 2015-01-31 DIAGNOSIS — R1312 Dysphagia, oropharyngeal phase: Secondary | ICD-10-CM | POA: Diagnosis not present

## 2015-01-31 DIAGNOSIS — J449 Chronic obstructive pulmonary disease, unspecified: Secondary | ICD-10-CM | POA: Diagnosis not present

## 2015-01-31 DIAGNOSIS — Z8673 Personal history of transient ischemic attack (TIA), and cerebral infarction without residual deficits: Secondary | ICD-10-CM | POA: Diagnosis not present

## 2015-01-31 DIAGNOSIS — E039 Hypothyroidism, unspecified: Secondary | ICD-10-CM | POA: Diagnosis not present

## 2015-02-02 ENCOUNTER — Encounter: Payer: Self-pay | Admitting: Internal Medicine

## 2015-02-04 DIAGNOSIS — J449 Chronic obstructive pulmonary disease, unspecified: Secondary | ICD-10-CM | POA: Diagnosis not present

## 2015-02-04 DIAGNOSIS — I6789 Other cerebrovascular disease: Secondary | ICD-10-CM | POA: Diagnosis not present

## 2015-02-05 DIAGNOSIS — R1312 Dysphagia, oropharyngeal phase: Secondary | ICD-10-CM | POA: Diagnosis not present

## 2015-02-05 DIAGNOSIS — J449 Chronic obstructive pulmonary disease, unspecified: Secondary | ICD-10-CM | POA: Diagnosis not present

## 2015-02-05 DIAGNOSIS — Z8673 Personal history of transient ischemic attack (TIA), and cerebral infarction without residual deficits: Secondary | ICD-10-CM | POA: Diagnosis not present

## 2015-02-05 DIAGNOSIS — Z9981 Dependence on supplemental oxygen: Secondary | ICD-10-CM | POA: Diagnosis not present

## 2015-02-05 DIAGNOSIS — E039 Hypothyroidism, unspecified: Secondary | ICD-10-CM | POA: Diagnosis not present

## 2015-02-05 DIAGNOSIS — I10 Essential (primary) hypertension: Secondary | ICD-10-CM | POA: Diagnosis not present

## 2015-02-05 DIAGNOSIS — Z720 Tobacco use: Secondary | ICD-10-CM | POA: Diagnosis not present

## 2015-02-06 DIAGNOSIS — I10 Essential (primary) hypertension: Secondary | ICD-10-CM | POA: Diagnosis not present

## 2015-02-06 DIAGNOSIS — R1312 Dysphagia, oropharyngeal phase: Secondary | ICD-10-CM | POA: Diagnosis not present

## 2015-02-06 DIAGNOSIS — Z8673 Personal history of transient ischemic attack (TIA), and cerebral infarction without residual deficits: Secondary | ICD-10-CM | POA: Diagnosis not present

## 2015-02-06 DIAGNOSIS — J449 Chronic obstructive pulmonary disease, unspecified: Secondary | ICD-10-CM | POA: Diagnosis not present

## 2015-02-06 DIAGNOSIS — Z720 Tobacco use: Secondary | ICD-10-CM | POA: Diagnosis not present

## 2015-02-06 DIAGNOSIS — Z9981 Dependence on supplemental oxygen: Secondary | ICD-10-CM | POA: Diagnosis not present

## 2015-02-06 DIAGNOSIS — E039 Hypothyroidism, unspecified: Secondary | ICD-10-CM | POA: Diagnosis not present

## 2015-02-07 DIAGNOSIS — Z9981 Dependence on supplemental oxygen: Secondary | ICD-10-CM | POA: Diagnosis not present

## 2015-02-07 DIAGNOSIS — R1312 Dysphagia, oropharyngeal phase: Secondary | ICD-10-CM | POA: Diagnosis not present

## 2015-02-07 DIAGNOSIS — Z8673 Personal history of transient ischemic attack (TIA), and cerebral infarction without residual deficits: Secondary | ICD-10-CM | POA: Diagnosis not present

## 2015-02-07 DIAGNOSIS — J449 Chronic obstructive pulmonary disease, unspecified: Secondary | ICD-10-CM | POA: Diagnosis not present

## 2015-02-07 DIAGNOSIS — Z720 Tobacco use: Secondary | ICD-10-CM | POA: Diagnosis not present

## 2015-02-07 DIAGNOSIS — I10 Essential (primary) hypertension: Secondary | ICD-10-CM | POA: Diagnosis not present

## 2015-02-07 DIAGNOSIS — E039 Hypothyroidism, unspecified: Secondary | ICD-10-CM | POA: Diagnosis not present

## 2015-02-10 DIAGNOSIS — R1312 Dysphagia, oropharyngeal phase: Secondary | ICD-10-CM | POA: Diagnosis not present

## 2015-02-10 DIAGNOSIS — Z9981 Dependence on supplemental oxygen: Secondary | ICD-10-CM | POA: Diagnosis not present

## 2015-02-10 DIAGNOSIS — I10 Essential (primary) hypertension: Secondary | ICD-10-CM | POA: Diagnosis not present

## 2015-02-10 DIAGNOSIS — J449 Chronic obstructive pulmonary disease, unspecified: Secondary | ICD-10-CM | POA: Diagnosis not present

## 2015-02-10 DIAGNOSIS — E039 Hypothyroidism, unspecified: Secondary | ICD-10-CM | POA: Diagnosis not present

## 2015-02-10 DIAGNOSIS — Z8673 Personal history of transient ischemic attack (TIA), and cerebral infarction without residual deficits: Secondary | ICD-10-CM | POA: Diagnosis not present

## 2015-02-10 DIAGNOSIS — Z720 Tobacco use: Secondary | ICD-10-CM | POA: Diagnosis not present

## 2015-02-11 DIAGNOSIS — N390 Urinary tract infection, site not specified: Secondary | ICD-10-CM | POA: Diagnosis not present

## 2015-02-11 DIAGNOSIS — Z681 Body mass index (BMI) 19 or less, adult: Secondary | ICD-10-CM | POA: Diagnosis not present

## 2015-02-11 DIAGNOSIS — Z9181 History of falling: Secondary | ICD-10-CM | POA: Diagnosis not present

## 2015-02-12 DIAGNOSIS — J449 Chronic obstructive pulmonary disease, unspecified: Secondary | ICD-10-CM | POA: Diagnosis not present

## 2015-02-12 DIAGNOSIS — I10 Essential (primary) hypertension: Secondary | ICD-10-CM | POA: Diagnosis not present

## 2015-02-12 DIAGNOSIS — Z9981 Dependence on supplemental oxygen: Secondary | ICD-10-CM | POA: Diagnosis not present

## 2015-02-12 DIAGNOSIS — Z8673 Personal history of transient ischemic attack (TIA), and cerebral infarction without residual deficits: Secondary | ICD-10-CM | POA: Diagnosis not present

## 2015-02-12 DIAGNOSIS — E039 Hypothyroidism, unspecified: Secondary | ICD-10-CM | POA: Diagnosis not present

## 2015-02-12 DIAGNOSIS — R1312 Dysphagia, oropharyngeal phase: Secondary | ICD-10-CM | POA: Diagnosis not present

## 2015-02-12 DIAGNOSIS — Z720 Tobacco use: Secondary | ICD-10-CM | POA: Diagnosis not present

## 2015-02-17 ENCOUNTER — Encounter: Payer: Self-pay | Admitting: Internal Medicine

## 2015-02-18 DIAGNOSIS — J449 Chronic obstructive pulmonary disease, unspecified: Secondary | ICD-10-CM | POA: Diagnosis not present

## 2015-02-18 DIAGNOSIS — Z9981 Dependence on supplemental oxygen: Secondary | ICD-10-CM | POA: Diagnosis not present

## 2015-02-18 DIAGNOSIS — Z8673 Personal history of transient ischemic attack (TIA), and cerebral infarction without residual deficits: Secondary | ICD-10-CM | POA: Diagnosis not present

## 2015-02-18 DIAGNOSIS — I10 Essential (primary) hypertension: Secondary | ICD-10-CM | POA: Diagnosis not present

## 2015-02-18 DIAGNOSIS — Z720 Tobacco use: Secondary | ICD-10-CM | POA: Diagnosis not present

## 2015-02-18 DIAGNOSIS — E039 Hypothyroidism, unspecified: Secondary | ICD-10-CM | POA: Diagnosis not present

## 2015-02-18 DIAGNOSIS — R1312 Dysphagia, oropharyngeal phase: Secondary | ICD-10-CM | POA: Diagnosis not present

## 2015-02-19 DIAGNOSIS — J449 Chronic obstructive pulmonary disease, unspecified: Secondary | ICD-10-CM | POA: Diagnosis not present

## 2015-02-19 DIAGNOSIS — Z720 Tobacco use: Secondary | ICD-10-CM | POA: Diagnosis not present

## 2015-02-19 DIAGNOSIS — Z9981 Dependence on supplemental oxygen: Secondary | ICD-10-CM | POA: Diagnosis not present

## 2015-02-19 DIAGNOSIS — R1312 Dysphagia, oropharyngeal phase: Secondary | ICD-10-CM | POA: Diagnosis not present

## 2015-02-19 DIAGNOSIS — Z8673 Personal history of transient ischemic attack (TIA), and cerebral infarction without residual deficits: Secondary | ICD-10-CM | POA: Diagnosis not present

## 2015-02-19 DIAGNOSIS — I10 Essential (primary) hypertension: Secondary | ICD-10-CM | POA: Diagnosis not present

## 2015-02-19 DIAGNOSIS — E039 Hypothyroidism, unspecified: Secondary | ICD-10-CM | POA: Diagnosis not present

## 2015-02-21 DIAGNOSIS — Z9981 Dependence on supplemental oxygen: Secondary | ICD-10-CM | POA: Diagnosis not present

## 2015-02-21 DIAGNOSIS — I10 Essential (primary) hypertension: Secondary | ICD-10-CM | POA: Diagnosis not present

## 2015-02-21 DIAGNOSIS — E039 Hypothyroidism, unspecified: Secondary | ICD-10-CM | POA: Diagnosis not present

## 2015-02-21 DIAGNOSIS — Z720 Tobacco use: Secondary | ICD-10-CM | POA: Diagnosis not present

## 2015-02-21 DIAGNOSIS — J449 Chronic obstructive pulmonary disease, unspecified: Secondary | ICD-10-CM | POA: Diagnosis not present

## 2015-02-21 DIAGNOSIS — R1312 Dysphagia, oropharyngeal phase: Secondary | ICD-10-CM | POA: Diagnosis not present

## 2015-02-21 DIAGNOSIS — Z8673 Personal history of transient ischemic attack (TIA), and cerebral infarction without residual deficits: Secondary | ICD-10-CM | POA: Diagnosis not present

## 2015-02-24 DIAGNOSIS — E039 Hypothyroidism, unspecified: Secondary | ICD-10-CM | POA: Diagnosis not present

## 2015-02-24 DIAGNOSIS — Z720 Tobacco use: Secondary | ICD-10-CM | POA: Diagnosis not present

## 2015-02-24 DIAGNOSIS — Z8673 Personal history of transient ischemic attack (TIA), and cerebral infarction without residual deficits: Secondary | ICD-10-CM | POA: Diagnosis not present

## 2015-02-24 DIAGNOSIS — R1312 Dysphagia, oropharyngeal phase: Secondary | ICD-10-CM | POA: Diagnosis not present

## 2015-02-24 DIAGNOSIS — J449 Chronic obstructive pulmonary disease, unspecified: Secondary | ICD-10-CM | POA: Diagnosis not present

## 2015-02-24 DIAGNOSIS — Z9981 Dependence on supplemental oxygen: Secondary | ICD-10-CM | POA: Diagnosis not present

## 2015-02-24 DIAGNOSIS — I10 Essential (primary) hypertension: Secondary | ICD-10-CM | POA: Diagnosis not present

## 2015-02-26 DIAGNOSIS — Z720 Tobacco use: Secondary | ICD-10-CM | POA: Diagnosis not present

## 2015-02-26 DIAGNOSIS — I10 Essential (primary) hypertension: Secondary | ICD-10-CM | POA: Diagnosis not present

## 2015-02-26 DIAGNOSIS — R1312 Dysphagia, oropharyngeal phase: Secondary | ICD-10-CM | POA: Diagnosis not present

## 2015-02-26 DIAGNOSIS — J449 Chronic obstructive pulmonary disease, unspecified: Secondary | ICD-10-CM | POA: Diagnosis not present

## 2015-02-26 DIAGNOSIS — E039 Hypothyroidism, unspecified: Secondary | ICD-10-CM | POA: Diagnosis not present

## 2015-02-26 DIAGNOSIS — Z8673 Personal history of transient ischemic attack (TIA), and cerebral infarction without residual deficits: Secondary | ICD-10-CM | POA: Diagnosis not present

## 2015-02-26 DIAGNOSIS — Z9981 Dependence on supplemental oxygen: Secondary | ICD-10-CM | POA: Diagnosis not present

## 2015-02-27 DIAGNOSIS — J449 Chronic obstructive pulmonary disease, unspecified: Secondary | ICD-10-CM | POA: Diagnosis not present

## 2015-02-27 DIAGNOSIS — Z720 Tobacco use: Secondary | ICD-10-CM | POA: Diagnosis not present

## 2015-02-27 DIAGNOSIS — Z9981 Dependence on supplemental oxygen: Secondary | ICD-10-CM | POA: Diagnosis not present

## 2015-02-27 DIAGNOSIS — Z8673 Personal history of transient ischemic attack (TIA), and cerebral infarction without residual deficits: Secondary | ICD-10-CM | POA: Diagnosis not present

## 2015-02-27 DIAGNOSIS — I10 Essential (primary) hypertension: Secondary | ICD-10-CM | POA: Diagnosis not present

## 2015-02-27 DIAGNOSIS — E039 Hypothyroidism, unspecified: Secondary | ICD-10-CM | POA: Diagnosis not present

## 2015-02-27 DIAGNOSIS — R1312 Dysphagia, oropharyngeal phase: Secondary | ICD-10-CM | POA: Diagnosis not present

## 2015-03-01 DIAGNOSIS — J449 Chronic obstructive pulmonary disease, unspecified: Secondary | ICD-10-CM | POA: Diagnosis not present

## 2015-03-05 ENCOUNTER — Other Ambulatory Visit: Payer: Self-pay | Admitting: Internal Medicine

## 2015-03-07 DIAGNOSIS — Z8673 Personal history of transient ischemic attack (TIA), and cerebral infarction without residual deficits: Secondary | ICD-10-CM | POA: Diagnosis not present

## 2015-03-07 DIAGNOSIS — H9190 Unspecified hearing loss, unspecified ear: Secondary | ICD-10-CM | POA: Diagnosis not present

## 2015-03-07 DIAGNOSIS — E079 Disorder of thyroid, unspecified: Secondary | ICD-10-CM | POA: Diagnosis not present

## 2015-03-07 DIAGNOSIS — M810 Age-related osteoporosis without current pathological fracture: Secondary | ICD-10-CM | POA: Diagnosis not present

## 2015-03-07 DIAGNOSIS — H547 Unspecified visual loss: Secondary | ICD-10-CM | POA: Diagnosis not present

## 2015-03-07 DIAGNOSIS — Z8249 Family history of ischemic heart disease and other diseases of the circulatory system: Secondary | ICD-10-CM | POA: Diagnosis not present

## 2015-03-07 DIAGNOSIS — S81811A Laceration without foreign body, right lower leg, initial encounter: Secondary | ICD-10-CM | POA: Diagnosis not present

## 2015-03-07 DIAGNOSIS — Z043 Encounter for examination and observation following other accident: Secondary | ICD-10-CM | POA: Diagnosis not present

## 2015-03-07 DIAGNOSIS — Z23 Encounter for immunization: Secondary | ICD-10-CM | POA: Diagnosis not present

## 2015-03-07 DIAGNOSIS — I10 Essential (primary) hypertension: Secondary | ICD-10-CM | POA: Diagnosis not present

## 2015-03-07 DIAGNOSIS — S0181XA Laceration without foreign body of other part of head, initial encounter: Secondary | ICD-10-CM | POA: Diagnosis not present

## 2015-03-07 DIAGNOSIS — D649 Anemia, unspecified: Secondary | ICD-10-CM | POA: Diagnosis not present

## 2015-03-07 DIAGNOSIS — K219 Gastro-esophageal reflux disease without esophagitis: Secondary | ICD-10-CM | POA: Diagnosis not present

## 2015-03-07 DIAGNOSIS — F329 Major depressive disorder, single episode, unspecified: Secondary | ICD-10-CM | POA: Diagnosis not present

## 2015-03-07 DIAGNOSIS — Z87891 Personal history of nicotine dependence: Secondary | ICD-10-CM | POA: Diagnosis not present

## 2015-03-12 ENCOUNTER — Other Ambulatory Visit: Payer: Self-pay | Admitting: Internal Medicine

## 2015-03-17 DIAGNOSIS — S81801A Unspecified open wound, right lower leg, initial encounter: Secondary | ICD-10-CM | POA: Diagnosis not present

## 2015-03-17 DIAGNOSIS — Z4802 Encounter for removal of sutures: Secondary | ICD-10-CM | POA: Diagnosis not present

## 2015-03-17 DIAGNOSIS — Z139 Encounter for screening, unspecified: Secondary | ICD-10-CM | POA: Diagnosis not present

## 2015-03-31 DIAGNOSIS — J449 Chronic obstructive pulmonary disease, unspecified: Secondary | ICD-10-CM | POA: Diagnosis not present

## 2015-04-15 DIAGNOSIS — E039 Hypothyroidism, unspecified: Secondary | ICD-10-CM | POA: Diagnosis not present

## 2015-04-15 DIAGNOSIS — Z79899 Other long term (current) drug therapy: Secondary | ICD-10-CM | POA: Diagnosis not present

## 2015-04-15 DIAGNOSIS — I10 Essential (primary) hypertension: Secondary | ICD-10-CM | POA: Diagnosis not present

## 2015-04-15 DIAGNOSIS — R1313 Dysphagia, pharyngeal phase: Secondary | ICD-10-CM | POA: Diagnosis not present

## 2015-04-15 DIAGNOSIS — F418 Other specified anxiety disorders: Secondary | ICD-10-CM | POA: Diagnosis not present

## 2015-04-15 DIAGNOSIS — Z23 Encounter for immunization: Secondary | ICD-10-CM | POA: Diagnosis not present

## 2015-04-23 ENCOUNTER — Ambulatory Visit: Payer: Medicare Other | Admitting: Internal Medicine

## 2015-05-01 DIAGNOSIS — J449 Chronic obstructive pulmonary disease, unspecified: Secondary | ICD-10-CM | POA: Diagnosis not present

## 2015-05-08 DIAGNOSIS — I083 Combined rheumatic disorders of mitral, aortic and tricuspid valves: Secondary | ICD-10-CM | POA: Diagnosis not present

## 2015-05-08 DIAGNOSIS — I361 Nonrheumatic tricuspid (valve) insufficiency: Secondary | ICD-10-CM | POA: Diagnosis not present

## 2015-05-08 DIAGNOSIS — I351 Nonrheumatic aortic (valve) insufficiency: Secondary | ICD-10-CM | POA: Diagnosis not present

## 2015-05-08 DIAGNOSIS — I517 Cardiomegaly: Secondary | ICD-10-CM | POA: Diagnosis not present

## 2015-05-08 DIAGNOSIS — I5189 Other ill-defined heart diseases: Secondary | ICD-10-CM | POA: Diagnosis not present

## 2015-05-21 DIAGNOSIS — H3530 Unspecified macular degeneration: Secondary | ICD-10-CM | POA: Diagnosis not present

## 2015-05-21 DIAGNOSIS — H26493 Other secondary cataract, bilateral: Secondary | ICD-10-CM | POA: Diagnosis not present

## 2015-06-01 DIAGNOSIS — J449 Chronic obstructive pulmonary disease, unspecified: Secondary | ICD-10-CM | POA: Diagnosis not present

## 2015-06-19 DIAGNOSIS — Z23 Encounter for immunization: Secondary | ICD-10-CM | POA: Diagnosis not present

## 2015-07-01 DIAGNOSIS — J449 Chronic obstructive pulmonary disease, unspecified: Secondary | ICD-10-CM | POA: Diagnosis not present

## 2015-08-01 DIAGNOSIS — J449 Chronic obstructive pulmonary disease, unspecified: Secondary | ICD-10-CM | POA: Diagnosis not present

## 2015-08-07 ENCOUNTER — Telehealth: Payer: Self-pay | Admitting: Internal Medicine

## 2015-08-07 DIAGNOSIS — Z1389 Encounter for screening for other disorder: Secondary | ICD-10-CM | POA: Diagnosis not present

## 2015-08-07 DIAGNOSIS — R1313 Dysphagia, pharyngeal phase: Secondary | ICD-10-CM | POA: Diagnosis not present

## 2015-08-07 DIAGNOSIS — K219 Gastro-esophageal reflux disease without esophagitis: Secondary | ICD-10-CM | POA: Diagnosis not present

## 2015-08-08 NOTE — Telephone Encounter (Signed)
Pt scheduled to see Doug SouJessica Zehr PA 08/13/15@1 :30pm. Left message for Bonita QuinLinda to call back.

## 2015-08-08 NOTE — Telephone Encounter (Signed)
April Mccoy called back. I gave her the appointment date & time. She will call the patient and give her the information.

## 2015-08-13 ENCOUNTER — Ambulatory Visit: Payer: Medicare Other | Admitting: Gastroenterology

## 2015-08-31 DIAGNOSIS — J449 Chronic obstructive pulmonary disease, unspecified: Secondary | ICD-10-CM | POA: Diagnosis not present

## 2015-09-05 DIAGNOSIS — J209 Acute bronchitis, unspecified: Secondary | ICD-10-CM | POA: Diagnosis not present

## 2015-09-10 DIAGNOSIS — R1312 Dysphagia, oropharyngeal phase: Secondary | ICD-10-CM | POA: Diagnosis not present

## 2015-09-17 DIAGNOSIS — R1312 Dysphagia, oropharyngeal phase: Secondary | ICD-10-CM | POA: Diagnosis not present

## 2015-09-18 DIAGNOSIS — R1312 Dysphagia, oropharyngeal phase: Secondary | ICD-10-CM | POA: Diagnosis not present

## 2015-09-22 DIAGNOSIS — R1312 Dysphagia, oropharyngeal phase: Secondary | ICD-10-CM | POA: Diagnosis not present

## 2015-09-24 DIAGNOSIS — R1312 Dysphagia, oropharyngeal phase: Secondary | ICD-10-CM | POA: Diagnosis not present

## 2015-09-26 DIAGNOSIS — R1312 Dysphagia, oropharyngeal phase: Secondary | ICD-10-CM | POA: Diagnosis not present

## 2015-09-29 DIAGNOSIS — D649 Anemia, unspecified: Secondary | ICD-10-CM | POA: Diagnosis not present

## 2015-09-29 DIAGNOSIS — I499 Cardiac arrhythmia, unspecified: Secondary | ICD-10-CM | POA: Diagnosis not present

## 2015-09-29 DIAGNOSIS — E079 Disorder of thyroid, unspecified: Secondary | ICD-10-CM | POA: Diagnosis not present

## 2015-09-29 DIAGNOSIS — D509 Iron deficiency anemia, unspecified: Secondary | ICD-10-CM | POA: Diagnosis not present

## 2015-09-29 DIAGNOSIS — I10 Essential (primary) hypertension: Secondary | ICD-10-CM | POA: Diagnosis not present

## 2015-09-29 DIAGNOSIS — Z6820 Body mass index (BMI) 20.0-20.9, adult: Secondary | ICD-10-CM | POA: Diagnosis not present

## 2015-09-29 DIAGNOSIS — Z87891 Personal history of nicotine dependence: Secondary | ICD-10-CM | POA: Diagnosis not present

## 2015-09-29 DIAGNOSIS — R0989 Other specified symptoms and signs involving the circulatory and respiratory systems: Secondary | ICD-10-CM | POA: Diagnosis not present

## 2015-09-29 DIAGNOSIS — R531 Weakness: Secondary | ICD-10-CM | POA: Diagnosis not present

## 2015-09-29 DIAGNOSIS — R1312 Dysphagia, oropharyngeal phase: Secondary | ICD-10-CM | POA: Diagnosis not present

## 2015-09-29 DIAGNOSIS — H9190 Unspecified hearing loss, unspecified ear: Secondary | ICD-10-CM | POA: Diagnosis not present

## 2015-09-29 DIAGNOSIS — Z8673 Personal history of transient ischemic attack (TIA), and cerebral infarction without residual deficits: Secondary | ICD-10-CM | POA: Diagnosis not present

## 2015-09-29 DIAGNOSIS — R0600 Dyspnea, unspecified: Secondary | ICD-10-CM | POA: Diagnosis not present

## 2015-09-29 DIAGNOSIS — K219 Gastro-esophageal reflux disease without esophagitis: Secondary | ICD-10-CM | POA: Diagnosis not present

## 2015-09-29 DIAGNOSIS — Z7982 Long term (current) use of aspirin: Secondary | ICD-10-CM | POA: Diagnosis not present

## 2015-10-01 DIAGNOSIS — J449 Chronic obstructive pulmonary disease, unspecified: Secondary | ICD-10-CM | POA: Diagnosis not present

## 2015-10-01 DIAGNOSIS — R1312 Dysphagia, oropharyngeal phase: Secondary | ICD-10-CM | POA: Diagnosis not present

## 2015-10-02 DIAGNOSIS — R1312 Dysphagia, oropharyngeal phase: Secondary | ICD-10-CM | POA: Diagnosis not present

## 2015-10-03 DIAGNOSIS — R1312 Dysphagia, oropharyngeal phase: Secondary | ICD-10-CM | POA: Diagnosis not present

## 2015-10-06 DIAGNOSIS — R110 Nausea: Secondary | ICD-10-CM | POA: Diagnosis not present

## 2015-10-06 DIAGNOSIS — Z6820 Body mass index (BMI) 20.0-20.9, adult: Secondary | ICD-10-CM | POA: Diagnosis not present

## 2015-10-06 DIAGNOSIS — R42 Dizziness and giddiness: Secondary | ICD-10-CM | POA: Diagnosis not present

## 2015-10-06 DIAGNOSIS — D649 Anemia, unspecified: Secondary | ICD-10-CM | POA: Diagnosis not present

## 2015-10-07 DIAGNOSIS — I6789 Other cerebrovascular disease: Secondary | ICD-10-CM | POA: Diagnosis not present

## 2015-10-07 DIAGNOSIS — J449 Chronic obstructive pulmonary disease, unspecified: Secondary | ICD-10-CM | POA: Diagnosis not present

## 2015-10-07 DIAGNOSIS — R1312 Dysphagia, oropharyngeal phase: Secondary | ICD-10-CM | POA: Diagnosis not present

## 2015-10-09 DIAGNOSIS — R350 Frequency of micturition: Secondary | ICD-10-CM | POA: Diagnosis not present

## 2015-10-09 DIAGNOSIS — R1312 Dysphagia, oropharyngeal phase: Secondary | ICD-10-CM | POA: Diagnosis not present

## 2015-10-10 DIAGNOSIS — R1312 Dysphagia, oropharyngeal phase: Secondary | ICD-10-CM | POA: Diagnosis not present

## 2015-10-13 ENCOUNTER — Encounter: Payer: Self-pay | Admitting: Physician Assistant

## 2015-10-13 ENCOUNTER — Ambulatory Visit (INDEPENDENT_AMBULATORY_CARE_PROVIDER_SITE_OTHER): Payer: Medicare Other | Admitting: Physician Assistant

## 2015-10-13 ENCOUNTER — Other Ambulatory Visit (INDEPENDENT_AMBULATORY_CARE_PROVIDER_SITE_OTHER): Payer: Medicare Other

## 2015-10-13 VITALS — BP 130/80 | HR 80

## 2015-10-13 DIAGNOSIS — D509 Iron deficiency anemia, unspecified: Secondary | ICD-10-CM

## 2015-10-13 DIAGNOSIS — R5383 Other fatigue: Secondary | ICD-10-CM

## 2015-10-13 LAB — CBC WITH DIFFERENTIAL/PLATELET
BASOS PCT: 1.3 % (ref 0.0–3.0)
Basophils Absolute: 0.1 10*3/uL (ref 0.0–0.1)
EOS ABS: 0.2 10*3/uL (ref 0.0–0.7)
Eosinophils Relative: 3.1 % (ref 0.0–5.0)
HEMATOCRIT: 31.4 % — AB (ref 36.0–46.0)
HEMOGLOBIN: 9.2 g/dL — AB (ref 12.0–15.0)
LYMPHS PCT: 26.2 % (ref 12.0–46.0)
Lymphs Abs: 1.5 10*3/uL (ref 0.7–4.0)
MCHC: 29.2 g/dL — ABNORMAL LOW (ref 30.0–36.0)
MCV: 76.4 fl — AB (ref 78.0–100.0)
MONOS PCT: 9.3 % (ref 3.0–12.0)
Monocytes Absolute: 0.5 10*3/uL (ref 0.1–1.0)
Neutro Abs: 3.4 10*3/uL (ref 1.4–7.7)
Neutrophils Relative %: 60.1 % (ref 43.0–77.0)
Platelets: 295 10*3/uL (ref 150.0–400.0)
RBC: 4.11 Mil/uL (ref 3.87–5.11)
RDW: 21.8 % — AB (ref 11.5–15.5)
WBC: 5.6 10*3/uL (ref 4.0–10.5)

## 2015-10-13 NOTE — Patient Instructions (Signed)
The hospital will be calling you about being admitted on Monday 10-20-2015.   You have been scheduled for an endoscopy and colonoscopy. Please follow the written instructions given to you at your visit today.  If you use inhalers (even only as needed), please bring them with you on the day of your procedure. Your physician has requested that you go to [email or web address] and enter the access code given to you at your visit today. This web site gives a general overview about your procedure. However, you should still follow specific instructions given to you by our office regarding your preparation for the procedure.

## 2015-10-13 NOTE — Progress Notes (Signed)
Patient ID: PANHIA KARL, female   DOB: January 02, 1929, 82 y.o.   MRN: [id number]   Subjective:    Patient ID: [name], female    DOB: March 13, 1929, 82 y.o.   MRN: [id number]  HPI  Shayana    Is a pleasant 82 year old white female known to Dr.Pyrtle only from EGD done and she was in the hospital in May 2016. Appetite that time she had complaints of dysphagia and an abnormal barium swallow. She was found to have several nonbleeding clean-based linear ulcers in the lower third of her esophagus and a 3 cm hiatal hernia. Biopsies from the esophagus showed densely inflamed squamous lined mucosa with associated ulcer. H pylori negative , AFB , GMS negative.  At that time patient had a hemoglobin of 9 and was iron deficient. She had been started on an oral iron supplement by the hospitalist service.  She is referred back today by her primary care provider [name] M.D. In [place] for evaluation of iron deficiency anemia. Patient's daughter says that she took the iron supplement for month or so after she was in the hospital last summer and then stopped it. She was started back on it within the past couple of weeks.  They are not aware of any melena or hematochezia and patient has no complaints of abdominal pain. Apparently she has been complaining of fatigue and intermittent dizziness. Patient also has history of a CVA, she is nonambulatory and cared for by her family. She has history of COPD and has oxygen at home which she doesn't wear her on a regular basis though her daughter says sometimes her O2 sats dropped into the 2s and she puts her oxygen on.  Most recent labs sent to show hemoglobin of 8.9 hematocrit of 31 MCV of 77 platelets 310 ferritin 106 TIBC less than 457 iron 440 and iron sat greater than 96 on October 07 2015 09/29/2015 stool Hemoccults negative LFTs within normal limits , iron 21 TIBC of 455 and iron sat of 5 , hemoglobin 8 hematocrit of 26.6 and MCV of 70 09/29/2015.  Patient has not  had prior colonoscopy.  Review of Systems Pertinent positive and negative review of systems were noted in the above HPI section.  All other review of systems was otherwise negative.  Outpatient Encounter Prescriptions as of 10/13/2015  Medication Sig  . albuterol (PROVENTIL) (2.5 MG/3ML) 0.083% nebulizer solution Take 3 mLs (2.5 mg total) by nebulization every 6 (six) hours as needed for wheezing or shortness of breath.  Marland Kitchen alendronate (FOSAMAX) 70 MG tablet Take 70 mg by mouth once a week.   Marland Kitchen aspirin EC 81 MG tablet Take 1 tablet (81 mg total) by mouth daily.  . benazepril (LOTENSIN) 10 MG tablet Take 5 mg by mouth daily.   . calcium carbonate (OS-CAL - DOSED IN MG OF ELEMENTAL CALCIUM) 1250 MG tablet Take 1 tablet (500 mg of elemental calcium total) by mouth 2 (two) times daily with a meal.  . escitalopram (LEXAPRO) 5 MG tablet Take 5 mg by mouth daily.  . iron polysaccharides (NIFEREX) 150 MG capsule Take 1 capsule (150 mg total) by mouth daily.  Marland Kitchen levothyroxine (SYNTHROID, LEVOTHROID) 25 MCG tablet Take 25 mcg by mouth daily before breakfast.  . omeprazole (PRILOSEC) 40 MG capsule Take 40 mg by mouth daily.  . [DISCONTINUED] ipratropium-albuterol (DUONEB) 0.5-2.5 (3) MG/3ML SOLN Inhale 3 mLs into the lungs daily.   . [DISCONTINUED] acetaminophen (TYLENOL) 325 MG tablet Take 650 mg by mouth  every 6 (six) hours as needed for mild pain.  . [DISCONTINUED] sucralfate (CARAFATE) 1 GM/10ML suspension Take 10 mLs (1 g total) by mouth 4 (four) times daily -  with meals and at bedtime.  . [DISCONTINUED] Vitamin D, Ergocalciferol, (DRISDOL) 50000 UNITS CAPS capsule Take 1 capsule (50,000 Units total) by mouth every 7 (seven) days. (Patient taking differently: Take 50,000 Units by mouth every 7 (seven) days. Takes on Mondays)   No facility-administered encounter medications on file as of 10/13/2015.   No Known Allergies Patient Active Problem List   Diagnosis Date Noted  . Esophageal ulcer 01/29/2015    . Duodenal ulcer 01/29/2015  . Esophageal dysmotility 01/29/2015  . Esophageal ulceration   . Hypoxia 01/25/2015  . Nausea and vomiting 01/25/2015  . SOB (shortness of breath) 01/25/2015  . Hypoxemia 01/25/2015  . Fracture of multiple ribs 08/14/2014  . Osteopenia 08/14/2014  . Chest pain 08/13/2014  . GERD (gastroesophageal reflux disease) 08/13/2014  . Anemia 08/13/2014  . Depression 08/13/2014  . History of stroke 08/13/2014  . Hypertension 08/13/2014  . Thyroid disease 08/13/2014  . COPD (chronic obstructive pulmonary disease) (HCC) 08/13/2014  . Dysphagia 08/13/2014  . Right flank pain 08/13/2014  . Mid back pain 08/13/2014  . Abnormality of gait 08/13/2014  . Tobacco abuse 08/13/2014   Social History   Social History  . Marital Status: Widowed    Spouse Name: N/A  . Number of Children: N/A  . Years of Education: N/A   Occupational History  . Not on file.   Social History Main Topics  . Smoking status: Former Games developer  . Smokeless tobacco: Former Neurosurgeon  . Alcohol Use: No  . Drug Use: No  . Sexual Activity: Not on file   Other Topics Concern  . Not on file   Social History Narrative   Lives alone in [place]   Smoking since age 89 3 packs will last 3-4 days, trying to cut back and quit   1 living daughter, 2 daughters deceased    3 living sons    Unable to do ADLs daughter [name] is helping with these at home 6 days per week from 7:30 a to 5:30/6 p       Ms. Switala's family history includes Alcohol abuse in her father; COPD in her son; Diabetes in her son; Heart disease in her mother; Hypertension in her mother.      Objective:    Filed Vitals:   10/13/15 1424  BP: 130/80  Pulse: 80    Physical Exam   Well-developed elderly white female in no acute distress, she is in a wheelchair and accompanied by her son and daughter. Blood pressure 130/80 pulse 80 not weighed today. HEENT; nontraumatic normocephalic EOMI PERRLA sclera anicteric, Cardiovascular  ;regular rate and rhythm with S1-S2 no murmur or gallop, Pulmonary ;decreased breath sounds bilaterally few scattered rhonchi, Abdomen ;soft, nontender ,nondistended bowel sounds are active there is no palpable mass or hepatosplenomegaly, Rectal ;exam not done she was recently documented heme negative. Extremities; no clubbing cyanosis or edema skin warm and dry, Neuropsych patient has a slight left facial droop, mood and affect appropriate     Assessment & Plan:   #1 82 yo female with Fe deficiency anemia ( 2 sets of iron studies discordinant) , currently heme negative. Pt with hx of esophageal ulcers on EGD 01/2015. Will r/o occult colon lesion ,AVM's vs possible chronic loss from esophagus. #2 COPD- 02 requirement , though doesn't use daily #3  hx CVA #4 non-ambulatory #5 depression  Plan;  Patient will require admission for prep as family does not feel she can complete a prep at home , Will schedule for plan to admission early next week for bowel prep and then colonoscopy and EGD per Dr. Myrtie Neither.  Procedures discussed in detail with the patient's daughter and son including risks and benefits and they are agreeable to proceed.  Obviously she is at high risk for complications with sedation due to her pulmonary status and will need pre-procedure anesthesia evaluation.  She will also benefit from blood transfusions and this may need to be done before next week if hemoglobin any lower today.  Check CBC today.  Continue omeprazole 40 mg by mouth every morning..  Will arrange for admission on Monday 2/13- and ask Hospitalist  Service to admit that day, with procedures on 2/14 at 11 am.   Sammuel Cooper PA-C 10/13/2015   Cc: Ailene Ravel, MD

## 2015-10-14 ENCOUNTER — Encounter (HOSPITAL_COMMUNITY): Payer: Self-pay | Admitting: *Deleted

## 2015-10-14 DIAGNOSIS — R1312 Dysphagia, oropharyngeal phase: Secondary | ICD-10-CM | POA: Diagnosis not present

## 2015-10-15 ENCOUNTER — Telehealth: Payer: Self-pay | Admitting: Physician Assistant

## 2015-10-15 DIAGNOSIS — R1312 Dysphagia, oropharyngeal phase: Secondary | ICD-10-CM | POA: Diagnosis not present

## 2015-10-15 NOTE — Telephone Encounter (Signed)
I have left a voicemail for Clydie Braun with this information. Notified the daughter Delford Field also.

## 2015-10-15 NOTE — Telephone Encounter (Signed)
Initially i did- but then we decided she would be admitted  next week for workup -so  She may not need them - I will order on discharge  Next week if I think she needs follow up labs

## 2015-10-15 NOTE — Telephone Encounter (Signed)
Caller name: Clydie Braun Relation to pt: [facility]  Call back number: [phone]   Reason for call:  Patient had paperwork for regular CBC testing; Clydie Braun wants to make sure Amy will be signing off on this.

## 2015-10-15 NOTE — Telephone Encounter (Signed)
Did you give her an order to have weekly CBC's drawn? I couldn't locate it in the OV note. Thanks.

## 2015-10-16 NOTE — Progress Notes (Signed)
Thank you for sending this case to me. I have reviewed the entire note, and the outlined plan seems appropriate.  

## 2015-10-17 ENCOUNTER — Telehealth: Payer: Self-pay | Admitting: Gastroenterology

## 2015-10-17 ENCOUNTER — Encounter: Payer: Self-pay | Admitting: Physician Assistant

## 2015-10-17 NOTE — Telephone Encounter (Signed)
Pt's daughter was concerned about the hospital admit. She was not aware that the hospital will contact her on Monday morning with the arrival time for the admission.  She is aware that if they do not hear from [facility] to call us Monday.

## 2015-10-20 ENCOUNTER — Encounter (HOSPITAL_COMMUNITY): Payer: Self-pay | Admitting: *Deleted

## 2015-10-20 ENCOUNTER — Observation Stay (HOSPITAL_COMMUNITY)
Admission: RE | Admit: 2015-10-20 | Discharge: 2015-10-21 | Disposition: A | Discharge status: Home / Self Care | Payer: Medicare Other | Source: Ambulatory Visit | Attending: Gastroenterology | Admitting: Gastroenterology

## 2015-10-20 DIAGNOSIS — Z8711 Personal history of peptic ulcer disease: Secondary | ICD-10-CM | POA: Insufficient documentation

## 2015-10-20 DIAGNOSIS — Z993 Dependence on wheelchair: Secondary | ICD-10-CM | POA: Insufficient documentation

## 2015-10-20 DIAGNOSIS — Z903 Acquired absence of stomach [part of]: Secondary | ICD-10-CM | POA: Diagnosis not present

## 2015-10-20 DIAGNOSIS — F329 Major depressive disorder, single episode, unspecified: Secondary | ICD-10-CM | POA: Diagnosis not present

## 2015-10-20 DIAGNOSIS — Z96643 Presence of artificial hip joint, bilateral: Secondary | ICD-10-CM | POA: Diagnosis not present

## 2015-10-20 DIAGNOSIS — D509 Iron deficiency anemia, unspecified: Secondary | ICD-10-CM | POA: Diagnosis not present

## 2015-10-20 DIAGNOSIS — Z7982 Long term (current) use of aspirin: Secondary | ICD-10-CM | POA: Insufficient documentation

## 2015-10-20 DIAGNOSIS — I10 Essential (primary) hypertension: Secondary | ICD-10-CM | POA: Diagnosis not present

## 2015-10-20 DIAGNOSIS — Z8673 Personal history of transient ischemic attack (TIA), and cerebral infarction without residual deficits: Secondary | ICD-10-CM | POA: Diagnosis not present

## 2015-10-20 DIAGNOSIS — K222 Esophageal obstruction: Secondary | ICD-10-CM | POA: Diagnosis not present

## 2015-10-20 DIAGNOSIS — Z87891 Personal history of nicotine dependence: Secondary | ICD-10-CM | POA: Insufficient documentation

## 2015-10-20 DIAGNOSIS — Z79899 Other long term (current) drug therapy: Secondary | ICD-10-CM | POA: Insufficient documentation

## 2015-10-20 DIAGNOSIS — G709 Myoneural disorder, unspecified: Secondary | ICD-10-CM | POA: Insufficient documentation

## 2015-10-20 DIAGNOSIS — E039 Hypothyroidism, unspecified: Secondary | ICD-10-CM | POA: Diagnosis not present

## 2015-10-20 DIAGNOSIS — J431 Panlobular emphysema: Secondary | ICD-10-CM | POA: Insufficient documentation

## 2015-10-20 DIAGNOSIS — K219 Gastro-esophageal reflux disease without esophagitis: Secondary | ICD-10-CM | POA: Diagnosis not present

## 2015-10-20 DIAGNOSIS — J449 Chronic obstructive pulmonary disease, unspecified: Secondary | ICD-10-CM | POA: Diagnosis not present

## 2015-10-20 DIAGNOSIS — Z9981 Dependence on supplemental oxygen: Secondary | ICD-10-CM | POA: Insufficient documentation

## 2015-10-20 DIAGNOSIS — K449 Diaphragmatic hernia without obstruction or gangrene: Secondary | ICD-10-CM | POA: Diagnosis not present

## 2015-10-20 LAB — COMPREHENSIVE METABOLIC PANEL
ALT: 17 U/L (ref 14–54)
ANION GAP: 5 (ref 5–15)
AST: 19 U/L (ref 15–41)
Albumin: 3.6 g/dL (ref 3.5–5.0)
Alkaline Phosphatase: 77 U/L (ref 38–126)
BUN: 23 mg/dL — ABNORMAL HIGH (ref 6–20)
CHLORIDE: 108 mmol/L (ref 101–111)
CO2: 27 mmol/L (ref 22–32)
Calcium: 9.6 mg/dL (ref 8.9–10.3)
Creatinine, Ser: 0.71 mg/dL (ref 0.44–1.00)
Glucose, Bld: 107 mg/dL — ABNORMAL HIGH (ref 65–99)
POTASSIUM: 3.8 mmol/L (ref 3.5–5.1)
SODIUM: 140 mmol/L (ref 135–145)
Total Bilirubin: 0.6 mg/dL (ref 0.3–1.2)
Total Protein: 6.3 g/dL — ABNORMAL LOW (ref 6.5–8.1)

## 2015-10-20 LAB — CBC
HCT: 33.6 % — ABNORMAL LOW (ref 36.0–46.0)
HEMOGLOBIN: 9.5 g/dL — AB (ref 12.0–15.0)
MCH: 24 pg — AB (ref 26.0–34.0)
MCHC: 28.3 g/dL — ABNORMAL LOW (ref 30.0–36.0)
MCV: 84.8 fL (ref 78.0–100.0)
PLATELETS: 254 10*3/uL (ref 150–400)
RBC: 3.96 MIL/uL (ref 3.87–5.11)
RDW: 20.5 % — ABNORMAL HIGH (ref 11.5–15.5)
WBC: 4.8 10*3/uL (ref 4.0–10.5)

## 2015-10-20 LAB — PROTIME-INR
INR: 0.98 (ref 0.00–1.49)
Prothrombin Time: 13.2 seconds (ref 11.6–15.2)

## 2015-10-20 MED ORDER — PEG-KCL-NACL-NASULF-NA ASC-C 100 G PO SOLR: 1.0000 | Freq: Once | ORAL | Status: DC

## 2015-10-20 MED ORDER — LEVOTHYROXINE SODIUM 25 MCG PO TABS
25.0000 ug | ORAL_TABLET | Freq: Every day | ORAL | Status: DC
  Filled 2015-10-20 (×2): qty 1

## 2015-10-20 MED ORDER — PANTOPRAZOLE SODIUM 40 MG PO TBEC
40.0000 mg | DELAYED_RELEASE_TABLET | Freq: Every day | ORAL | Status: DC
  Filled 2015-10-20: qty 1

## 2015-10-20 MED ORDER — PEG-KCL-NACL-NASULF-NA ASC-C 100 G PO SOLR
0.5000 | Freq: Once | ORAL | Status: AC
  Administered 2015-10-21: 100 g via ORAL
  Filled 2015-10-20: qty 1

## 2015-10-20 MED ORDER — ESCITALOPRAM OXALATE 5 MG PO TABS
5.0000 mg | ORAL_TABLET | Freq: Every day | ORAL | Status: DC
  Filled 2015-10-20: qty 1

## 2015-10-20 MED ORDER — BENAZEPRIL HCL 5 MG PO TABS
5.0000 mg | ORAL_TABLET | Freq: Every day | ORAL | Status: DC
  Filled 2015-10-20: qty 1

## 2015-10-20 MED ORDER — SODIUM CHLORIDE 0.9% FLUSH: 3.0000 mL | INTRAVENOUS | Status: DC | PRN

## 2015-10-20 MED ORDER — MAGNESIUM HYDROXIDE 400 MG/5ML PO SUSP: 30.0000 mL | Freq: Every day | ORAL | Status: DC | PRN

## 2015-10-20 MED ORDER — SODIUM CHLORIDE 0.9 % IV SOLN: 250.0000 mL | INTRAVENOUS | Status: DC | PRN

## 2015-10-20 MED ORDER — ONDANSETRON HCL 4 MG PO TABS: 4.0000 mg | ORAL_TABLET | Freq: Four times a day (QID) | ORAL | Status: DC | PRN

## 2015-10-20 MED ORDER — ACETAMINOPHEN 650 MG RE SUPP: 650.0000 mg | Freq: Four times a day (QID) | RECTAL | Status: DC | PRN

## 2015-10-20 MED ORDER — SODIUM CHLORIDE 0.9% FLUSH
3.0000 mL | Freq: Two times a day (BID) | INTRAVENOUS | Status: DC
  Administered 2015-10-20: 3 mL via INTRAVENOUS

## 2015-10-20 MED ORDER — ALBUTEROL SULFATE (2.5 MG/3ML) 0.083% IN NEBU: 2.5000 mg | INHALATION_SOLUTION | Freq: Four times a day (QID) | RESPIRATORY_TRACT | Status: DC | PRN

## 2015-10-20 MED ORDER — IPRATROPIUM-ALBUTEROL 0.5-2.5 (3) MG/3ML IN SOLN
3.0000 mL | Freq: Two times a day (BID) | RESPIRATORY_TRACT | Status: DC
  Administered 2015-10-20: 3 mL via RESPIRATORY_TRACT
  Filled 2015-10-20 (×2): qty 3

## 2015-10-20 MED ORDER — PEG-KCL-NACL-NASULF-NA ASC-C 100 G PO SOLR
0.5000 | Freq: Once | ORAL | Status: AC
  Administered 2015-10-20: 100 g via ORAL
  Filled 2015-10-20: qty 1

## 2015-10-20 MED ORDER — ACETAMINOPHEN 325 MG PO TABS: 650.0000 mg | ORAL_TABLET | Freq: Four times a day (QID) | ORAL | Status: DC | PRN

## 2015-10-20 MED ORDER — ONDANSETRON HCL 4 MG/2ML IJ SOLN
4.0000 mg | Freq: Four times a day (QID) | INTRAMUSCULAR | Status: DC | PRN
  Administered 2015-10-21: 4 mg via INTRAVENOUS

## 2015-10-20 MED ORDER — GLYCERIN (LAXATIVE) 2.1 G RE SUPP
1.0000 | Freq: Once | RECTAL | Status: AC
  Administered 2015-10-20: 1 via RECTAL
  Filled 2015-10-20: qty 1

## 2015-10-20 NOTE — Progress Notes (Signed)
[id number] April Mccoy

## 2015-10-20 NOTE — Anesthesia Preprocedure Evaluation (Addendum)
Anesthesia Evaluation  Patient identified by MRN, date of birth, ID band Patient awake  General Assessment Comment:Past Medical History Diagnosis Date . Hypertension  . Stroke [facility])    with h/o dysphagia post stroke . Thyroid disease  . Left shoulder pain    DJD . UTI (lower urinary tract infection)    Enterobacter aerogenes . COPD (chronic obstructive pulmonary disease) (HCC)  . GERD (gastroesophageal reflux disease)  . Abnormality of gait  . Hard of hearing  . Anemia  . Depression  . Tobacco abuse  . Wheelchair bound  . Bowel obstruction (HCC)  . Fracture of coccyx (HCC)    history . Shortness of breath dyspnea        Reviewed: Allergy & Precautions, NPO status , Patient's Chart, lab work & pertinent test results  Airway Mallampati: II  TM Distance: >3 FB Neck ROM: Full    Dental no notable dental hx.    Pulmonary shortness of breath and at rest, COPD,  COPD inhaler and oxygen dependent, former smoker,    Pulmonary exam normal breath sounds clear to auscultation       Cardiovascular hypertension, Pt. on medications Normal cardiovascular exam Rhythm:Regular Rate:Normal     Neuro/Psych PSYCHIATRIC DISORDERS Depression  Neuromuscular disease CVA, Residual Symptoms    GI/Hepatic Neg liver ROS, PUD, GERD  Medicated and Controlled,Hx/o Duodenal and gastric ulcers Hx/o Bowel obstruction   Endo/Other  Hypothyroidism   Renal/GU negative Renal ROS  negative genitourinary   Musculoskeletal Osteopenia   Abdominal   Peds  Hematology  (+) anemia ,   Anesthesia Other Findings   Reproductive/Obstetrics                          Anesthesia Physical Anesthesia Plan  ASA: IV  Anesthesia Plan: MAC   Post-op Pain Management:    Induction: Intravenous  Airway Management Planned: Mask  Additional Equipment:   Intra-op  Plan:   Post-operative Plan:   Informed Consent: I have reviewed the patients History and Physical, chart, labs and discussed the procedure including the risks, benefits and alternatives for the proposed anesthesia with the patient or authorized representative who has indicated his/her understanding and acceptance.     Plan Discussed with: Anesthesiologist, CRNA and Surgeon  Anesthesia Plan Comments:        Anesthesia Quick Evaluation

## 2015-10-20 NOTE — H&P (Signed)
[facility] Admission Note   Primary Care Physician:  April Ravel, MD Primary Gastroenterologist:  Dr. Rhea Mccoy  CHIEF COMPLAINT: iron deficiency anemia  HPI: April Mccoy is a 82 y.o. female who is known to Dr. Rhea Mccoy. She was admitted to the hospital in May 2016 with complaints of weakness after 1 week of odynophagia and poor oral intake. She is wheelchair bound for 3-4 years due to a history of strokes and hip fracture. She is status post gastrectomy" due to ulcer disease when she was in her 24s or 87s. In May 2016 she had an abnormal barium swallow which was performed due to complaints of dysphagia. She had an EGD and was found to have several nonbleeding, clean-based, linear ulcers in the lower third of her esophagus and a 3 cm hiatal hernia. Biopsies from the esophagus showed densely inflamed squamous lined mucosa with associated ulcer. H. pylori negative, AFP and GMS negative. At that time her hemoglobin was 9 and she was iron deficient. She was started on oral iron. She was seen in the GI office last week at the request of her primary care provider, April Mccoy, M.D. in [place] for evaluation of iron deficiency anemia. She does not been having any bright red blood per rectum or melena denies complaints of abdominal pain. She has been fatiguing easily and has some intermittent dizziness. She has a history of COPD and has oxygen but she does not wear it on a regular basis. Her daughter who was at bedside today states sometimes her oxygen sats dropping to 7 days and she puts her oxygen on. Recent laboratory studies in January 2017 showed a hemoglobin of 8.9, hematocrit 31, MCV 77, platelets 310,000, ferritin 106, TIBC less than 457, iron 440, iron sat greater than 96. Stool Hemoccults on 09/29/2015 was negative. Patient has not had prior colonoscopy.   Past Medical History  Diagnosis Date  . Hypertension   . Stroke [facility])     with  h/o dysphagia post stroke  . Thyroid disease   . Left shoulder pain     DJD  . UTI (lower urinary tract infection)     Enterobacter aerogenes  . COPD (chronic obstructive pulmonary disease) (HCC)   . GERD (gastroesophageal reflux disease)   . Abnormality of gait   . Hard of hearing   . Anemia   . Depression   . Tobacco abuse   . Wheelchair bound   . Bowel obstruction (HCC)   . Fracture of coccyx (HCC)     history  . Shortness of breath dyspnea     Past Surgical History  Procedure Laterality Date  . Hernia repair    . Hip fracture surgery    . Joint replacement      b/l hips  . Gastrectomy  In her 40s versus 50s.    for ulcer.  No details as to the extent of surgery.  . Abdominal hysterectomy    . Cataract extraction    . Esophagogastroduodenoscopy N/A 01/28/2015    Procedure: ESOPHAGOGASTRODUODENOSCOPY (EGD);  Surgeon: April Fiedler, MD;  Location: [facility] ENDOSCOPY;  Service: Endoscopy;  Laterality: N/A;  . Balloon dilation N/A 01/28/2015    Procedure: BALLOON DILATION;  Surgeon: April Fiedler, MD;  Location: [facility] ENDOSCOPY;  Service: Endoscopy;  Laterality: N/A;    Prior to Admission medications   Medication Sig Start Date End Date Taking? Authorizing Provider  acetaminophen (TYLENOL) 325 MG tablet Take 325 mg by mouth every 6 (six) hours as  needed for moderate pain.   Yes Historical Provider, MD  aspirin EC 81 MG tablet Take 1 tablet (81 mg total) by mouth daily. 02/12/15  Yes April Elk, MD  benazepril (LOTENSIN) 10 MG tablet Take 5 mg by mouth every morning.  02/28/14  Yes Historical Provider, MD  CALCIUM-VITAMIN D PO Take 1 tablet by mouth every morning.   Yes Historical Provider, MD  escitalopram (LEXAPRO) 10 MG tablet Take 10 mg by mouth every morning.   Yes Historical Provider, MD  ferrous sulfate 325 (65 FE) MG tablet Take 325 mg by mouth daily with breakfast.   Yes Historical Provider, MD  ipratropium-albuterol (DUONEB) 0.5-2.5 (3) MG/3ML SOLN Take 3 mLs by  nebulization 2 (two) times daily.   Yes Historical Provider, MD  levothyroxine (SYNTHROID, LEVOTHROID) 25 MCG tablet Take 25 mcg by mouth daily before breakfast.   Yes Historical Provider, MD  ondansetron (ZOFRAN) 4 MG tablet 1 TABLET BY MOUTH3 TIMES A DAY AS NEEDED FOR NAUSEA 10/06/15  Yes Historical Provider, MD  pantoprazole (PROTONIX) 40 MG tablet Take 40 mg by mouth daily.   Yes Historical Provider, MD  protective barrier (RESTORE) CREA Apply 1 application topically every other day. To backside after showers.   Yes Historical Provider, MD  albuterol (PROVENTIL) (2.5 MG/3ML) 0.083% nebulizer solution Take 3 mLs (2.5 mg total) by nebulization every 6 (six) hours as needed for wheezing or shortness of breath. Patient not taking: Reported on 10/14/2015 01/29/15   April Elk, MD  calcium carbonate (OS-CAL - DOSED IN MG OF ELEMENTAL CALCIUM) 1250 MG tablet Take 1 tablet (500 mg of elemental calcium total) by mouth 2 (two) times daily with a meal. Patient not taking: Reported on 10/14/2015 08/14/14   April Salvage, MD  iron polysaccharides (NIFEREX) 150 MG capsule Take 1 capsule (150 mg total) by mouth daily. Patient not taking: Reported on 10/14/2015 01/29/15   April Elk, MD    No current facility-administered medications for this encounter.    Allergies as of 10/13/2015  . (No Known Allergies)    Family History  Problem Relation Age of Onset  . Heart disease Mother     died age 77 MI  . Hypertension Mother   . Alcohol abuse Father   . Cancer      sister, 1 brother (lung) 1 brother (colon), daughter x 2 deceased 1 died age 59 pancreatitic another died age 68 lung  . Diabetes Son   . COPD Son     Social History   Social History  . Marital Status: Widowed    Spouse Name: N/A  . Number of Children: N/A  . Years of Education: N/A   Occupational History  . Not on file.   Social History Main Topics  . Smoking status: Former Smoker    Types: Cigarettes    Quit date:  05/26/2014  . Smokeless tobacco: Former Neurosurgeon  . Alcohol Use: No  . Drug Use: No  . Sexual Activity: Not on file   Other Topics Concern  . Not on file   Social History Narrative   Lives alone in [place]   Smoking since age 19 3 packs will last 3-4 days, trying to cut back and quit   1 living daughter, 2 daughters deceased    3 living sons    Unable to do ADLs daughter [name] is helping with these at home 6 days per week from 7:30 a to 5:30/6 p  REVIEW OF SYSTEMS: Per history of present illness otherwise negative.  PHYSICAL EXAM:  Pulse Rate:  [76] 76 (02/13 1240) Resp:  [18] 18 (02/13 1240) BP: (146)/(83) 146/83 mmHg (02/13 1240) SpO2:  [95 %] 95 % (02/13 1240) Weight:  [110 lb 4.8 oz (50.032 kg)] 110 lb 4.8 oz (50.032 kg) (02/13 1240)    General:  Well-developed, pleasant elderly white female in no apparent distress Ears:  No obvious external deformity Mouth:  Mucous membranes moist Neck: No adenopathy, no JVD, soft, supple, full range of motion Lungs:  Diminished breath sounds bilaterally with few scattered rhonchi Heart:  Regular rate and rhythm S1 and S2 Abdomen: Soft, nontender, nondistended, normal active bowel sounds, no hepatosplenomegaly.  Rectal:  Not done Msk:  No obvious deformity  Extremities:  No cyanosis, clubbing, edema Neurologic: Alert and oriented 3, slight left facial droop Skin:  No obvious rash. Psych:  Mood and affect appropriate  Intake/Output from previous day:   Intake/Output this shift:    LAB RESULTS: CBC on 10/13/2015 showed WBC 5.6, hemoglobin 9.2, hematocrit 31.4, MCV 76.4, platelets 295,000.    IMPRESSION/PLAN:  82 year old female recently found to have an iron deficiency anemia, currently with heme-negative stools. Patient has a history of esophageal ulcers documented on EGD in May 2016. Patient has been advised to undergo colonoscopy to evaluate for polyps, neoplasia, AVMs, etc. as well as a repeat EGD to evaluate for  esophagitis, gastritis, ulcer, etc. Due to her stroke, patient has been deemed unable to prep at home and has been brought in to prep in the hospital. She is higher risk for complications of sedation due to her pulmonary status. Will ask anesthesia for a preprocedure evaluation. We'll also ask the hospitalist to evaluate and follow along with [place] for management of her medical problems. Patient scheduled for EGD and colonoscopy tomorrow.The risks, benefits, and alternatives to endoscopy with possible biopsy and possible dilation were discussed with the patient and they consent to proceed. The risks, benefits, and alternatives to colonoscopy with possible biopsy and possible polypectomy were discussed with the patient and they consent to proceed.   COPD History CVA History depression History hypothyroidism    LOS: 0 days   Kepler Mccabe, Tollie Pizza  10/20/2015, 12:51 PM Pager [phone] Mon-Fri 8a-5p [phone] after 5p, weekends, holidays

## 2015-10-20 NOTE — Consult Note (Signed)
[facility] Medical Consultation  MICHALE WEIKEL WUJ:[id number] DOB: [birth year] DOA: 10/20/2015   PCP: Leonides Sake, MD    Requesting physician: Dr. Loletha Carrow Date of consultation: 10/20/15 Reason for consultation: Medical management  Chief Complaint: I'm here for a colonoscopy  HPI: April Mccoy is a 82 y.o. female  with a past medical history of hypertension, COPD, GERD, who uses oxygen intermittently at home. She was admitted by gastroenterology service for inpatient colonoscopy under sedation. This is being done for iron deficiency anemia. Apparently, she had upper GI workup last year and she was found to have nonbleeding esophageal ulcers. H pylori was negative. Patient was thought to need a colonoscopy. Due to high risk profile this will need to be done as an inpatient with anesthesia support. Hospitalist service was consulted for assistance with medical management. Patient denies any complaints at this time. She states that she is breathing well. Denies any nausea, vomiting or abdominal pain. No diarrhea. She is slightly concerned about taking the bowel prep for her colonoscopy.   Home Medications: Prior to Admission medications   Medication Sig Start Date End Date Taking? Authorizing Provider  acetaminophen (TYLENOL) 325 MG tablet Take 325 mg by mouth every 6 (six) hours as needed for moderate pain.   Yes Historical Provider, MD  aspirin EC 81 MG tablet Take 1 tablet (81 mg total) by mouth daily. 02/12/15  Yes Charlynne Cousins, MD  benazepril (LOTENSIN) 10 MG tablet Take 5 mg by mouth every morning.  02/28/14  Yes Historical Provider, MD  CALCIUM-VITAMIN D PO Take 1 tablet by mouth every morning.   Yes Historical Provider, MD  escitalopram (LEXAPRO) 10 MG tablet Take 10 mg by mouth every morning.   Yes Historical Provider, MD  ferrous sulfate 325 (65 FE) MG tablet Take 325 mg by mouth daily with breakfast.   Yes Historical Provider, MD  ipratropium-albuterol (DUONEB) 0.5-2.5  (3) MG/3ML SOLN Take 3 mLs by nebulization 2 (two) times daily.   Yes Historical Provider, MD  levothyroxine (SYNTHROID, LEVOTHROID) 25 MCG tablet Take 25 mcg by mouth daily before breakfast.   Yes Historical Provider, MD  ondansetron (ZOFRAN) 4 MG tablet 1 TABLET BY MOUTH3 TIMES A DAY AS NEEDED FOR NAUSEA 10/06/15  Yes Historical Provider, MD  pantoprazole (PROTONIX) 40 MG tablet Take 40 mg by mouth daily.   Yes Historical Provider, MD  protective barrier (RESTORE) CREA Apply 1 application topically every other day. To backside after showers.   Yes Historical Provider, MD  albuterol (PROVENTIL) (2.5 MG/3ML) 0.083% nebulizer solution Take 3 mLs (2.5 mg total) by nebulization every 6 (six) hours as needed for wheezing or shortness of breath. Patient not taking: Reported on 10/14/2015 01/29/15   Charlynne Cousins, MD  calcium carbonate (OS-CAL - DOSED IN MG OF ELEMENTAL CALCIUM) 1250 MG tablet Take 1 tablet (500 mg of elemental calcium total) by mouth 2 (two) times daily with a meal. Patient not taking: Reported on 10/14/2015 08/14/14   Jones Bales, MD  iron polysaccharides (NIFEREX) 150 MG capsule Take 1 capsule (150 mg total) by mouth daily. Patient not taking: Reported on 10/14/2015 01/29/15   Charlynne Cousins, MD    Current Inpatient Medications:  Scheduled: . [START ON 10/21/2015] benazepril  5 mg Oral Daily  . [START ON 10/21/2015] escitalopram  5 mg Oral Daily  . [START ON 10/21/2015] levothyroxine  25 mcg Oral QAC breakfast  . [START ON 10/21/2015] pantoprazole  40 mg Oral Daily  . peg 3350  powder  0.5 kit Oral Once   And  . [START ON 10/21/2015] peg 3350 powder  0.5 kit Oral Once  . sodium chloride flush  3 mL Intravenous Q12H   Continuous:  UKG:URKYHC chloride, acetaminophen **OR** acetaminophen, albuterol, magnesium hydroxide, ondansetron **OR** ondansetron (ZOFRAN) IV, sodium chloride flush  Allergies: No Known Allergies  Past Medical History: Past Medical History  Diagnosis Date    . Hypertension   . Stroke [facility])     with h/o dysphagia post stroke  . Thyroid disease   . Left shoulder pain     DJD  . UTI (lower urinary tract infection)     Enterobacter aerogenes  . COPD (chronic obstructive pulmonary disease) ([place])   . GERD (gastroesophageal reflux disease)   . Abnormality of gait   . Hard of hearing   . Anemia   . Depression   . Tobacco abuse   . Wheelchair bound   . Bowel obstruction ([place])   . Fracture of coccyx ([place])     history  . Shortness of breath dyspnea     Past Surgical History  Procedure Laterality Date  . Hernia repair    . Hip fracture surgery    . Joint replacement      b/l hips  . Gastrectomy  In her 56s versus 20s.    for ulcer.  No details as to the extent of surgery.  . Abdominal hysterectomy    . Cataract extraction    . Esophagogastroduodenoscopy N/A 01/28/2015    Procedure: ESOPHAGOGASTRODUODENOSCOPY (EGD);  Surgeon: Jerene Bears, MD;  Location: [facility] ENDOSCOPY;  Service: Endoscopy;  Laterality: N/A;  . Balloon dilation N/A 01/28/2015    Procedure: BALLOON DILATION;  Surgeon: Jerene Bears, MD;  Location: [facility] ENDOSCOPY;  Service: Endoscopy;  Laterality: N/A;    Social History:  Social History   Social History  . Marital Status: Widowed    Spouse Name: N/A  . Number of Children: N/A  . Years of Education: N/A   Occupational History  . Not on file.   Social History Main Topics  . Smoking status: Former Smoker    Types: Cigarettes    Quit date: 05/26/2014  . Smokeless tobacco: Former Systems developer  . Alcohol Use: No  . Drug Use: No  . Sexual Activity: Not on file   Other Topics Concern  . Not on file   Social History Narrative   Lives alone in [place]   Smoking since age 20 3 packs will last 3-4 days, trying to cut back and quit   1 living daughter, 2 daughters deceased    3 living sons    Unable to do ADLs daughter Bethena Roys is helping with these at home 6 days per week from 7:30 a to 5:30/6 p        Family History:   Family History  Problem Relation Age of Onset  . Heart disease Mother     died age 68 MI  . Hypertension Mother   . Alcohol abuse Father   . Cancer      sister, 1 brother (lung) 1 brother (colon), daughter x 2 deceased 1 died age 28 pancreatitic another died age 53 lung  . Diabetes Son   . COPD Son      Review of Systems - Negative except As in history of present illness  Physical Examination: Filed Vitals:   10/20/15 1240 10/20/15 1259  BP: 146/83   Pulse: 76   Resp: 18   Height:  _0  (1.575 m) _1  (1.575 m)  Weight: 50.032 kg (110 lb 4.8 oz) 50 kg (110 lb 3.7 oz)  SpO2: 95%     General appearance: alert, cooperative, appears stated age and no distress Head: Normocephalic, without obvious abnormality, atraumatic Eyes: conjunctivae/corneas clear. PERRL, EOM's intact.  Neck: no adenopathy, no carotid bruit, no JVD, supple, symmetrical, trachea midline and thyroid not enlarged, symmetric, no tenderness/mass/nodules Resp: clear to auscultation bilaterally Cardio: regular rate and rhythm, S1, S2 normal, no murmur, click, rub or gallop GI: soft, non-tender; bowel sounds normal; no masses,  no organomegaly Extremities: extremities normal, atraumatic, no cyanosis or edema Pulses: 2+ and symmetric Neurologic: Awake and alert. Slightly distracted. No focal deficits appreciated.  Laboratory Data: Results for orders placed or performed during the hospital encounter of 10/20/15 (from the past 48 hour(s))  Comprehensive metabolic panel     Status: Abnormal   Collection Time: 10/20/15  2:12 PM  Result Value Ref Range   Sodium 140 135 - 145 mmol/L   Potassium 3.8 3.5 - 5.1 mmol/L   Chloride 108 101 - 111 mmol/L   CO2 27 22 - 32 mmol/L   Glucose, Bld 107 (H) 65 - 99 mg/dL   BUN 23 (H) 6 - 20 mg/dL   Creatinine, Ser 0.71 0.44 - 1.00 mg/dL   Calcium 9.6 8.9 - 10.3 mg/dL   Total Protein 6.3 (L) 6.5 - 8.1 g/dL   Albumin 3.6 3.5 - 5.0 g/dL   AST 19 15 - 41 U/L   ALT 17 14 - 54 U/L    Alkaline Phosphatase 77 38 - 126 U/L   Total Bilirubin 0.6 0.3 - 1.2 mg/dL   GFR calc non Af Amer >60 >60 mL/min   GFR calc Af Amer >60 >60 mL/min    Comment: (NOTE) The eGFR has been calculated using the CKD EPI equation. This calculation has not been validated in all clinical situations. eGFR's persistently <60 mL/min signify possible Chronic Kidney Disease.    Anion gap 5 5 - 15  CBC     Status: Abnormal   Collection Time: 10/20/15  2:12 PM  Result Value Ref Range   WBC 4.8 4.0 - 10.5 K/uL   RBC 3.96 3.87 - 5.11 MIL/uL   Hemoglobin 9.5 (L) 12.0 - 15.0 g/dL   HCT 33.6 (L) 36.0 - 46.0 %   MCV 84.8 78.0 - 100.0 fL   MCH 24.0 (L) 26.0 - 34.0 pg   MCHC 28.3 (L) 30.0 - 36.0 g/dL   RDW 20.5 (H) 11.5 - 15.5 %   Platelets 254 150 - 400 K/uL  Protime-INR     Status: None   Collection Time: 10/20/15  2:12 PM  Result Value Ref Range   Prothrombin Time 13.2 11.6 - 15.2 seconds   INR 0.98 0.00 - 1.49    Imaging Studies: No results found.   Impression/Recommendations  Active Problems:   Iron deficiency anemia   Panlobular emphysema ([place])   This is a 82 year old Caucasian female with past medical history as stated earlier, who is here for a colonoscopy to be done under sedation.  #1 COPD: Uses oxygen intermittently at home. She is on nebulizer treatments. Not noted to be on inhaled steroids. Patient does not know if she is prescribed the same. Currently, she appears to be quite stable from pulmonary standpoint.  #2 Essential hypertension: Continue with her home medication regimen. Blood pressure is reasonably well controlled.  #3 Iron deficiency anemia: Hemoglobin is 9.5 and stable  compared to previous values. Plan is for colonoscopy tomorrow.  Code Status:  Full code  TRH will followup again tomorrow. Please contact me if I can be of assistance in the meanwhile. Thank you for this consultation.  [facility] Pager [phone]  If 7PM-7AM, please  contact night-coverage.  [email or web address] Password [facility]  10/20/2015, 5:42 PM

## 2015-10-21 ENCOUNTER — Observation Stay (HOSPITAL_COMMUNITY): Payer: Medicare Other | Admitting: Anesthesiology

## 2015-10-21 ENCOUNTER — Encounter (HOSPITAL_COMMUNITY)
Admission: RE | Disposition: A | Discharge status: Home / Self Care | Payer: Self-pay | Source: Ambulatory Visit | Attending: Gastroenterology

## 2015-10-21 ENCOUNTER — Encounter (HOSPITAL_COMMUNITY): Payer: Self-pay | Admitting: Anesthesiology

## 2015-10-21 DIAGNOSIS — Z8673 Personal history of transient ischemic attack (TIA), and cerebral infarction without residual deficits: Secondary | ICD-10-CM | POA: Diagnosis not present

## 2015-10-21 DIAGNOSIS — Z79899 Other long term (current) drug therapy: Secondary | ICD-10-CM | POA: Diagnosis not present

## 2015-10-21 DIAGNOSIS — K5790 Diverticulosis of intestine, part unspecified, without perforation or abscess without bleeding: Secondary | ICD-10-CM | POA: Diagnosis not present

## 2015-10-21 DIAGNOSIS — Z7982 Long term (current) use of aspirin: Secondary | ICD-10-CM | POA: Diagnosis not present

## 2015-10-21 DIAGNOSIS — R5383 Other fatigue: Secondary | ICD-10-CM | POA: Diagnosis not present

## 2015-10-21 DIAGNOSIS — Z9981 Dependence on supplemental oxygen: Secondary | ICD-10-CM | POA: Diagnosis not present

## 2015-10-21 DIAGNOSIS — Z8711 Personal history of peptic ulcer disease: Secondary | ICD-10-CM | POA: Diagnosis not present

## 2015-10-21 DIAGNOSIS — K222 Esophageal obstruction: Secondary | ICD-10-CM | POA: Diagnosis not present

## 2015-10-21 DIAGNOSIS — D509 Iron deficiency anemia, unspecified: Principal | ICD-10-CM

## 2015-10-21 DIAGNOSIS — J431 Panlobular emphysema: Secondary | ICD-10-CM | POA: Diagnosis not present

## 2015-10-21 DIAGNOSIS — K562 Volvulus: Secondary | ICD-10-CM | POA: Diagnosis not present

## 2015-10-21 DIAGNOSIS — E039 Hypothyroidism, unspecified: Secondary | ICD-10-CM | POA: Diagnosis not present

## 2015-10-21 DIAGNOSIS — K449 Diaphragmatic hernia without obstruction or gangrene: Secondary | ICD-10-CM | POA: Diagnosis not present

## 2015-10-21 DIAGNOSIS — G709 Myoneural disorder, unspecified: Secondary | ICD-10-CM | POA: Diagnosis not present

## 2015-10-21 DIAGNOSIS — Z87891 Personal history of nicotine dependence: Secondary | ICD-10-CM | POA: Diagnosis not present

## 2015-10-21 DIAGNOSIS — K219 Gastro-esophageal reflux disease without esophagitis: Secondary | ICD-10-CM | POA: Diagnosis not present

## 2015-10-21 DIAGNOSIS — Z903 Acquired absence of stomach [part of]: Secondary | ICD-10-CM | POA: Diagnosis not present

## 2015-10-21 DIAGNOSIS — Z96643 Presence of artificial hip joint, bilateral: Secondary | ICD-10-CM | POA: Diagnosis not present

## 2015-10-21 DIAGNOSIS — Z993 Dependence on wheelchair: Secondary | ICD-10-CM | POA: Diagnosis not present

## 2015-10-21 DIAGNOSIS — I10 Essential (primary) hypertension: Secondary | ICD-10-CM | POA: Diagnosis not present

## 2015-10-21 HISTORY — PX: ESOPHAGOGASTRODUODENOSCOPY (EGD) WITH PROPOFOL: SHX5813

## 2015-10-21 HISTORY — PX: COLONOSCOPY WITH PROPOFOL: SHX5780

## 2015-10-21 LAB — CBC
HEMATOCRIT: 33.4 % — AB (ref 36.0–46.0)
HEMOGLOBIN: 9.6 g/dL — AB (ref 12.0–15.0)
MCH: 24.3 pg — ABNORMAL LOW (ref 26.0–34.0)
MCHC: 28.7 g/dL — ABNORMAL LOW (ref 30.0–36.0)
MCV: 84.6 fL (ref 78.0–100.0)
Platelets: 247 10*3/uL (ref 150–400)
RBC: 3.95 MIL/uL (ref 3.87–5.11)
RDW: 20.6 % — ABNORMAL HIGH (ref 11.5–15.5)
WBC: 4.1 10*3/uL (ref 4.0–10.5)

## 2015-10-21 LAB — BASIC METABOLIC PANEL
ANION GAP: 8 (ref 5–15)
BUN: 19 mg/dL (ref 6–20)
CALCIUM: 9.3 mg/dL (ref 8.9–10.3)
CO2: 24 mmol/L (ref 22–32)
Chloride: 110 mmol/L (ref 101–111)
Creatinine, Ser: 0.67 mg/dL (ref 0.44–1.00)
Glucose, Bld: 84 mg/dL (ref 65–99)
POTASSIUM: 4 mmol/L (ref 3.5–5.1)
Sodium: 142 mmol/L (ref 135–145)

## 2015-10-21 SURGERY — COLONOSCOPY WITH PROPOFOL
Anesthesia: Monitor Anesthesia Care

## 2015-10-21 MED ORDER — SODIUM CHLORIDE 0.9 % IV SOLN: INTRAVENOUS | Status: DC

## 2015-10-21 MED ORDER — PROPOFOL 500 MG/50ML IV EMUL
INTRAVENOUS | Status: DC | PRN
  Administered 2015-10-21: 50 mg via INTRAVENOUS

## 2015-10-21 MED ORDER — LACTATED RINGERS IV SOLN
INTRAVENOUS | Status: DC | PRN
  Administered 2015-10-21: 13:00:00 via INTRAVENOUS
  Administered 2015-10-21: 1000 mL

## 2015-10-21 MED ORDER — PROPOFOL 500 MG/50ML IV EMUL
INTRAVENOUS | Status: DC | PRN
  Administered 2015-10-21: 50 ug/kg/min via INTRAVENOUS

## 2015-10-21 MED ORDER — LACTATED RINGERS IV SOLN: INTRAVENOUS | Status: DC

## 2015-10-21 MED ORDER — GLYCOPYRROLATE 0.2 MG/ML IJ SOLN
INTRAMUSCULAR | Status: DC | PRN
  Administered 2015-10-21: 0.2 mg via INTRAVENOUS

## 2015-10-21 MED ORDER — PROPOFOL 10 MG/ML IV BOLUS
INTRAVENOUS | Status: AC
  Filled 2015-10-21: qty 20

## 2015-10-21 NOTE — Anesthesia Postprocedure Evaluation (Signed)
Anesthesia Post Note  Patient: April Mccoy  Procedure(s) Performed: Procedure(s) (LRB): COLONOSCOPY WITH PROPOFOL (N/A) ESOPHAGOGASTRODUODENOSCOPY (EGD) WITH PROPOFOL (N/A)  Patient location during evaluation: PACU Anesthesia Type: MAC Level of consciousness: awake and alert Pain management: pain level controlled Vital Signs Assessment: post-procedure vital signs reviewed and stable Respiratory status: spontaneous breathing, nonlabored ventilation, respiratory function stable and patient connected to nasal cannula oxygen Cardiovascular status: stable and blood pressure returned to baseline Anesthetic complications: no    Last Vitals:  Filed Vitals:   10/21/15 1430 10/21/15 1440  BP: 145/69 154/98  Pulse: 85 80  Temp:    Resp: 18 13    Last Pain:  Filed Vitals:   10/21/15 1447  PainSc: 0-No pain                 Deepa Barthel J

## 2015-10-21 NOTE — Op Note (Signed)
[facility] [address] [place], [address]   COLONOSCOPY PROCEDURE REPORT  PATIENT: April Mccoy, April Mccoy  MR#: [id number] BIRTHDATE: Apr 06, 1929 , 86  yrs. old GENDER: female ENDOSCOPIST: Amada Jupiter, MD REFERRED AO:ZHYQM Hamrick, M.D. PROCEDURE DATE:  10/21/2015 PROCEDURE:   Colonoscopy, diagnostic  ASA CLASS:   Class III INDICATIONS:iron deficiency anemia. MEDICATIONS: Monitored anesthesia care  DESCRIPTION OF PROCEDURE:   After the risks benefits and alternatives of the procedure were thoroughly explained, informed consent was obtained.  The digital rectal exam revealed decreased sphincter tone.   The Pentax Ped Colon [id number]  endoscope was introduced through the anus and advanced to the cecum, which was identified by both the appendix and ileocecal valve. No complications.  The patient had a brief period of baradycardia from vagal response to scope pressure.  The quality of the prep was good.  (MoviPrep was used)  The instrument was then slowly withdrawn as the colon was fully examined. Estimated blood loss is zero unless otherwise noted in this procedure report.      COLON FINDINGS:The colon was markedly redundant and tortuous, requiring repositioning to the back and external pressure. A normal appearing cecum, ileocecal valve, and appendiceal orifice were identified.  The ascending, transverse, descending, sigmoid colon, and rectum appeared unremarkable.  Retroflexed views revealed no abnormalities. The time to cecum = 19.4 Withdrawal time = 7.6   The scope was withdrawn and the procedure completed. COMPLICATIONS: There were no immediate complications.  ENDOSCOPIC IMPRESSION: Normal colonoscopy  The patient appears to have an obscure source of chronic small bowel blood loss.  The yield of further testing in such a patient is typically low.  RECOMMENDATIONS: Continue aspirin 81 mg daily due to history of CVA. Remain on oral iron Return to PCP to  monitor hemoglobin   eSigned:  Amada Jupiter, MD 10/21/2015 2:50 PM   cc: [name], MD

## 2015-10-21 NOTE — Discharge Summary (Signed)
[name] MRN: [id number] DOB: 1929/03/02 82 y.o. PCP:  Ailene Ravel, MD  Date of Admission: 10/20/2015 12:10 PM Date of Discharge: 10/21/2015 Attending Physician: Charlie Pitter III, MD  Discharge Diagnosis: Active Problems:   Iron deficiency anemia   Panlobular emphysema (HCC)   Consultations: Hospitalist service  Procedures Performed:   Colonoscopy and EGD 2/14 /2017   History/Physical Exam:  See Admission H&P  Admission HPI: April Mccoy  is a pleasant 82 year old white female who was recently seen in the office in consultation for iron deficiency anemia. She had Hemoccult negative stool. She had undergone prior EGD in May 2016 with finding of several shallow esophageal ulcers. She had not had prior colonoscopy. Patient is not ambulatory, post hip fracture and prior history of CVA and family did not feel that she could prep at home for any endoscopic procedures. She was therefore admitted on 10/20/2015 to undergo bowel prep and then planned colonoscopy and EGD. Patient did have difficulty with the prep but eventually was able to complete. She had EGD done which was unremarkable with the exception of a  Schatski  ring and Colonoscopy was also normal. Spell patient likely has small bowel AVMs with intermittent GI blood loss. She will be discharged home this afternoon to follow-up with her primary care M.D. She will need to have serial hemoglobins, transfusions as indicated and to remain on iron long-term. Given prior history of CVA okay to leave on a baby aspirin. No further GI workup or intervention planned.  Hospital Course by problem list: Active Problems:   Iron deficiency anemia   Panlobular emphysema (HCC)   Discharge Vitals:  BP 154/98 mmHg  Pulse 80  Temp(Src) 97.3 F (36.3 C) (Oral)  Resp 13  Ht  (1.575 m)  Wt 50 kg (110 lb 3.7 oz)  BMI 20.16 kg/m2  SpO2 100%  Discharge Labs:  Results for orders placed or  performed during the hospital encounter of 10/20/15 (from the past 24 hour(s))  Basic metabolic panel     Status: None   Collection Time: 10/21/15  6:01 AM  Result Value Ref Range   Sodium 142 135 - 145 mmol/L   Potassium 4.0 3.5 - 5.1 mmol/L   Chloride 110 101 - 111 mmol/L   CO2 24 22 - 32 mmol/L   Glucose, Bld 84 65 - 99 mg/dL   BUN 19 6 - 20 mg/dL   Creatinine, Ser 4.09 0.44 - 1.00 mg/dL   Calcium 9.3 8.9 - 81.1 mg/dL   GFR calc non Af Amer >60 >60 mL/min   GFR calc Af Amer >60 >60 mL/min   Anion gap 8 5 - 15  CBC     Status: Abnormal   Collection Time: 10/21/15  6:01 AM  Result Value Ref Range   WBC 4.1 4.0 - 10.5 K/uL   RBC 3.95 3.87 - 5.11 MIL/uL   Hemoglobin 9.6 (L) 12.0 - 15.0 g/dL   HCT 91.4 (L) 78.2 - 95.6 %   MCV 84.6 78.0 - 100.0 fL   MCH 24.3 (L) 26.0 - 34.0 pg   MCHC 28.7 (L) 30.0 - 36.0 g/dL   RDW 21.3 (H) 08.6 - 57.8 %   Platelets 247 150 - 400 K/uL    Disposition and follow-up:   April Mccoy was discharged from [facility] in stable condition.    Follow-up Appointments: Follow up with Primary MD  No GI follow up needed   Discharge Medications:  Medication List    STOP taking these medications        iron polysaccharides 150 MG capsule  Commonly known as:  NIFEREX      TAKE these medications        acetaminophen 325 MG tablet  Commonly known as:  TYLENOL  Take 325 mg by mouth every 6 (six) hours as needed for moderate pain.     albuterol (2.5 MG/3ML) 0.083% nebulizer solution  Commonly known as:  PROVENTIL  Take 3 mLs (2.5 mg total) by nebulization every 6 (six) hours as needed for wheezing or shortness of breath.     aspirin EC 81 MG tablet  Take 1 tablet (81 mg total) by mouth daily.     benazepril 10 MG tablet  Commonly known as:  LOTENSIN  Take 5 mg by mouth every morning.     calcium carbonate 1250 (500 Ca) MG tablet  Commonly known as:  OS-CAL - dosed in mg of elemental calcium  Take 1 tablet (500 mg of elemental  calcium total) by mouth 2 (two) times daily with a meal.     CALCIUM-VITAMIN D PO  Take 1 tablet by mouth every morning.     escitalopram 10 MG tablet  Commonly known as:  LEXAPRO  Take 10 mg by mouth every morning.     ferrous sulfate 325 (65 FE) MG tablet  Take 325 mg by mouth daily with breakfast.     ipratropium-albuterol 0.5-2.5 (3) MG/3ML Soln  Commonly known as:  DUONEB  Take 3 mLs by nebulization 2 (two) times daily.     levothyroxine 25 MCG tablet  Commonly known as:  SYNTHROID, LEVOTHROID  Take 25 mcg by mouth daily before breakfast.     ondansetron 4 MG tablet  Commonly known as:  ZOFRAN  1 TABLET BY MOUTH3 TIMES A DAY AS NEEDED FOR NAUSEA     pantoprazole 40 MG tablet  Commonly known as:  PROTONIX  Take 40 mg by mouth daily.     protective barrier Crea  Apply 1 application topically every other day. To backside after showers.        Signed: Mike Gip 10/21/2015, 2:52 PM

## 2015-10-21 NOTE — Care Management Obs Status (Signed)
MEDICARE OBSERVATION STATUS NOTIFICATION   Patient Details  Name: April Mccoy MRN: [id number] Date of Birth: 1929-02-09   Medicare Observation Status Notification Given:  Yes    Alexis Goodell, RN 10/21/2015, 3:32 PM

## 2015-10-21 NOTE — Discharge Instructions (Signed)
Follow up with primary MD  Get HGB checked again in 3-4 weeks

## 2015-10-21 NOTE — H&P (View-Only) (Signed)
Thank you for sending this case to me. I have reviewed the entire note, and the outlined plan seems appropriate.  

## 2015-10-21 NOTE — Op Note (Signed)
[facility] [address] [place], [address]   ENDOSCOPY PROCEDURE REPORT  PATIENT: April Mccoy, April Mccoy  MR#: [id number] BIRTHDATE: [birth year] , 86  yrs. old GENDER: female ENDOSCOPIST: Amada Jupiter, MD REFERRED BY:  [name], M.D. PROCEDURE DATE:  10/21/2015 PROCEDURE:  EGD, diagnostic ASA CLASS:     Class III INDICATIONS:  iron deficiency anemia. MEDICATIONS: Monitored anesthesia care TOPICAL ANESTHETIC:  DESCRIPTION OF PROCEDURE: After the risks benefits and alternatives of the procedure were thoroughly explained, informed consent was obtained.  The Pentax Gastroscope [id number] endoscope was introduced through the mouth and advanced to the second portion of the duodenum , Without limitations.  The instrument was slowly withdrawn as the mucosa was fully examined.      ESOPHAGUS: A mild Schatzki ring was found in the distal esophagus. There was a small sliding hiatal hernia. Retroflexed views revealed no abnormalities.     The scope was then withdrawn from the patient and the procedure completed.  COMPLICATIONS: There were no immediate complications.  ENDOSCOPIC IMPRESSION: Schatzki ring was found in the distal esophagus Hiatal hernia previously-seen distal esophageal ulcers were no longer present.  RECOMMENDATIONS: See colonoscopy report and recs  REPEAT EXAM:  eSigned:  Amada Jupiter, MD 10/21/2015 2:41 PM    XB:JYNWG Hamrick, MD

## 2015-10-21 NOTE — Transfer of Care (Signed)
Immediate Anesthesia Transfer of Care Note  Patient: April Mccoy  Procedure(s) Performed: Procedure(s): COLONOSCOPY WITH PROPOFOL (N/A) ESOPHAGOGASTRODUODENOSCOPY (EGD) WITH PROPOFOL (N/A)  Patient Location: PACU  Anesthesia Type:MAC  Level of Consciousness:  sedated, patient cooperative and responds to stimulation  Airway & Oxygen Therapy:Patient Spontanous Breathing and Patient connected to face mask oxgen  Post-op Assessment:  Report given to PACU RN and Post -op Vital signs reviewed and stable  Post vital signs:  Reviewed and stable  Last Vitals:  Filed Vitals:   10/21/15 1309 10/21/15 1425  BP: 186/80 123/70  Pulse:    Temp: 36.4 C 36.3 C  Resp: 15 13    Complications: No apparent anesthesia complications

## 2015-10-21 NOTE — Consult Note (Signed)
PROGRESS NOTE  April Mccoy JYN:[id number] DOB: 06-06-1929 DOA: 10/20/2015 PCP: Ailene Ravel, MD  April Mccoy is a 82 y.o. female with a past medical history of hypertension, COPD, GERD, who uses oxygen intermittently at home. She was admitted by gastroenterology service for inpatient colonoscopy under sedation. This is being done for iron deficiency anemia. Apparently, she had upper GI workup last year and she was found to have nonbleeding esophageal ulcers. H pylori was negative. Patient was thought to need a colonoscopy. Due to high risk profile this will need to be done as an inpatient with anesthesia support. Hospitalist service was consulted for assistance with medical management. Patient denies any complaints at this time. She states that she is breathing well.    Assessment/Plan: COPD: Uses oxygen intermittently at home- 2L-- mainly while sleeping it appears. -nebulizer treatments. - Currently, she appears to be quite stable from pulmonary standpoint.   Essential hypertension: Continue with her home medication regimen. Blood pressure is reasonably well controlled.  Iron deficiency anemia: Hemoglobin is 9.5 and stable compared to previous values. Plan is for colonoscopy tomorrow.  Ok for d/c from IM standpoint, call with questions    HPI/Subjective: Still drinking prep  Objective: Filed Vitals:   10/20/15 2234 10/21/15 0300  BP: 119/70 142/79  Pulse: 73 82  Temp: 98.2 F (36.8 C) 98.2 F (36.8 C)  Resp: 19 18    Intake/Output Summary (Last 24 hours) at 10/21/15 1038 Last data filed at 10/21/15 0600  Gross per 24 hour  Intake      0 ml  Output      2 ml  Net     -2 ml   Filed Weights   10/20/15 1240 10/20/15 1259  Weight: 50.032 kg (110 lb 4.8 oz) 50 kg (110 lb 3.7 oz)    Exam:   General:  Awake, NAD  Respiratory: no whezing   Data Reviewed: Basic Metabolic Panel:  Recent Labs Lab 10/20/15 1412 10/21/15 0601  NA 140 142  K 3.8 4.0  CL 108  110  CO2 27 24  GLUCOSE 107* 84  BUN 23* 19  CREATININE 0.71 0.67  CALCIUM 9.6 9.3   Liver Function Tests:  Recent Labs Lab 10/20/15 1412  AST 19  ALT 17  ALKPHOS 77  BILITOT 0.6  PROT 6.3*  ALBUMIN 3.6   No results for input(s): LIPASE, AMYLASE in the last 168 hours. No results for input(s): AMMONIA in the last 168 hours. CBC:  Recent Labs Lab 10/20/15 1412 10/21/15 0601  WBC 4.8 4.1  HGB 9.5* 9.6*  HCT 33.6* 33.4*  MCV 84.8 84.6  PLT 254 247   Cardiac Enzymes: No results for input(s): CKTOTAL, CKMB, CKMBINDEX, TROPONINI in the last 168 hours. BNP (last 3 results) No results for input(s): BNP in the last 8760 hours.  ProBNP (last 3 results) No results for input(s): PROBNP in the last 8760 hours.  CBG: No results for input(s): GLUCAP in the last 168 hours.  No results found for this or any previous visit (from the past 240 hour(s)).   Studies: No results found.  Scheduled Meds: . benazepril  5 mg Oral Daily  . escitalopram  5 mg Oral Daily  . ipratropium-albuterol  3 mL Nebulization BID  . levothyroxine  25 mcg Oral QAC breakfast  . pantoprazole  40 mg Oral Daily  . sodium chloride flush  3 mL Intravenous Q12H   Continuous Infusions:  Antibiotics Given (last 72 hours)    None  Active Problems:   Iron deficiency anemia   Panlobular emphysema (HCC)    Time spent: 15 min    Neosha Switalski [facility]  [facility] Pager [phone]. If 7PM-7AM, please contact night-coverage at [email or web address], password [facility] 10/21/2015, 10:38 AM  LOS: 1 day

## 2015-10-21 NOTE — Interval H&P Note (Signed)
History and Physical Interval Note:  10/21/2015 1:28 PM  [name]  has presented today for surgery, with the diagnosis of Iron deficiency anemia fatigue  The various methods of treatment have been discussed with the patient and family. After consideration of risks, benefits and other options for treatment, the patient has consented to  Procedure(s): COLONOSCOPY WITH PROPOFOL (N/A) ESOPHAGOGASTRODUODENOSCOPY (EGD) WITH PROPOFOL (N/A) as a surgical intervention .  The patient's history has been reviewed, patient examined, no change in status, stable for surgery.  I have reviewed the patient's chart and labs.  Questions were answered to the patient's satisfaction.     April Mccoy

## 2015-10-21 NOTE — Progress Notes (Signed)
Patient ID: April Mccoy, female   DOB: 12-31-1928, 82 y.o.   MRN: [id number]    Progress Note   Subjective  In good spirits ,, but has only had one liter of prep .Marland Kitchen Working slowly on second liter HGB 9.6 stable  Sat 93 on RA    Objective   Vital signs in last 24 hours: Temp:  [98.2 F (36.8 C)] 98.2 F (36.8 C) (02/14 0300) Pulse Rate:  [73-82] 82 (02/14 0300) Resp:  [18-19] 18 (02/14 0300) BP: (119-146)/(70-83) 142/79 mmHg (02/14 0300) SpO2:  [92 %-95 %] 93 % (02/14 0300) Weight:  [110 lb 3.7 oz (50 kg)-110 lb 4.8 oz (50.032 kg)] 110 lb 3.7 oz (50 kg) (02/13 1259) Last BM Date: 10/21/15 General:   elderly white female in NAD Heart:  Regular rate and rhythm; no murmurs Lungs: Respirations even and unlabored, lungs CTA bilaterally Abdomen:  Soft, nontender and nondistended. Normal bowel sounds. Extremities:  Without edema. Neurologic:  Alert and oriented,  grossly normal neurologically. Psych:  Cooperative. Normal mood and affect.  Intake/Output from previous day: 02/13 0701 - 02/14 0700 In: -  Out: 2 [Stool:2] Intake/Output this shift:    Lab Results:  Recent Labs  10/20/15 1412 10/21/15 0601  WBC 4.8 4.1  HGB 9.5* 9.6*  HCT 33.6* 33.4*  PLT 254 247   BMET  Recent Labs  10/20/15 1412 10/21/15 0601  NA 140 142  K 3.8 4.0  CL 108 110  CO2 27 24  GLUCOSE 107* 84  BUN 23* 19  CREATININE 0.71 0.67  CALCIUM 9.6 9.3   LFT  Recent Labs  10/20/15 1412  PROT 6.3*  ALBUMIN 3.6  AST 19  ALT 17  ALKPHOS 77  BILITOT 0.6   PT/INR  Recent Labs  10/20/15 1412  LABPROT 13.2  INR 0.98    :    Assessment / Plan:    #1  82 yo female with iron deficiency anemia, drift in hgb, recent heme negative- for EGD and Colon today Will move procedures to afternoon  As has not finished prep- and push nursing to help her with prep- enemas at noon  Hopefully home this evening #2 COPD  #3 hx CVA  Active Problems:   Iron deficiency anemia   Panlobular  emphysema (HCC)     LOS: 1 day   April Mccoy  10/21/2015, 8:47 AM

## 2015-10-22 ENCOUNTER — Encounter (HOSPITAL_COMMUNITY): Payer: Self-pay | Admitting: Gastroenterology

## 2015-10-23 DIAGNOSIS — R1312 Dysphagia, oropharyngeal phase: Secondary | ICD-10-CM | POA: Diagnosis not present

## 2015-10-29 DIAGNOSIS — R1312 Dysphagia, oropharyngeal phase: Secondary | ICD-10-CM | POA: Diagnosis not present

## 2015-11-01 DIAGNOSIS — J449 Chronic obstructive pulmonary disease, unspecified: Secondary | ICD-10-CM | POA: Diagnosis not present

## 2015-11-04 DIAGNOSIS — R1312 Dysphagia, oropharyngeal phase: Secondary | ICD-10-CM | POA: Diagnosis not present

## 2015-11-06 DIAGNOSIS — R1312 Dysphagia, oropharyngeal phase: Secondary | ICD-10-CM | POA: Diagnosis not present

## 2015-11-26 ENCOUNTER — Encounter: Payer: Medicare Other | Admitting: Gastroenterology

## 2015-11-29 DIAGNOSIS — J449 Chronic obstructive pulmonary disease, unspecified: Secondary | ICD-10-CM | POA: Diagnosis not present

## 2015-12-20 DIAGNOSIS — R1084 Generalized abdominal pain: Secondary | ICD-10-CM | POA: Diagnosis not present

## 2015-12-20 DIAGNOSIS — R05 Cough: Secondary | ICD-10-CM | POA: Diagnosis not present

## 2015-12-20 DIAGNOSIS — E7800 Pure hypercholesterolemia, unspecified: Secondary | ICD-10-CM | POA: Diagnosis not present

## 2015-12-20 DIAGNOSIS — I10 Essential (primary) hypertension: Secondary | ICD-10-CM | POA: Diagnosis not present

## 2015-12-20 DIAGNOSIS — J40 Bronchitis, not specified as acute or chronic: Secondary | ICD-10-CM | POA: Diagnosis not present

## 2015-12-24 DIAGNOSIS — R1312 Dysphagia, oropharyngeal phase: Secondary | ICD-10-CM | POA: Diagnosis not present

## 2015-12-24 DIAGNOSIS — R32 Unspecified urinary incontinence: Secondary | ICD-10-CM | POA: Diagnosis not present

## 2015-12-30 DIAGNOSIS — J449 Chronic obstructive pulmonary disease, unspecified: Secondary | ICD-10-CM | POA: Diagnosis not present

## 2016-01-29 DIAGNOSIS — J449 Chronic obstructive pulmonary disease, unspecified: Secondary | ICD-10-CM | POA: Diagnosis not present

## 2016-02-09 DIAGNOSIS — J449 Chronic obstructive pulmonary disease, unspecified: Secondary | ICD-10-CM | POA: Diagnosis not present

## 2016-02-09 DIAGNOSIS — I6789 Other cerebrovascular disease: Secondary | ICD-10-CM | POA: Diagnosis not present

## 2016-02-29 DIAGNOSIS — J449 Chronic obstructive pulmonary disease, unspecified: Secondary | ICD-10-CM | POA: Diagnosis not present

## 2016-03-23 DIAGNOSIS — J449 Chronic obstructive pulmonary disease, unspecified: Secondary | ICD-10-CM | POA: Diagnosis not present

## 2016-03-23 DIAGNOSIS — M810 Age-related osteoporosis without current pathological fracture: Secondary | ICD-10-CM | POA: Diagnosis not present

## 2016-03-23 DIAGNOSIS — Z79899 Other long term (current) drug therapy: Secondary | ICD-10-CM | POA: Diagnosis not present

## 2016-03-23 DIAGNOSIS — Z1389 Encounter for screening for other disorder: Secondary | ICD-10-CM | POA: Diagnosis not present

## 2016-03-23 DIAGNOSIS — I10 Essential (primary) hypertension: Secondary | ICD-10-CM | POA: Diagnosis not present

## 2016-03-23 DIAGNOSIS — R634 Abnormal weight loss: Secondary | ICD-10-CM | POA: Diagnosis not present

## 2016-03-23 DIAGNOSIS — D649 Anemia, unspecified: Secondary | ICD-10-CM | POA: Diagnosis not present

## 2016-03-30 DIAGNOSIS — J449 Chronic obstructive pulmonary disease, unspecified: Secondary | ICD-10-CM | POA: Diagnosis not present

## 2016-03-31 DIAGNOSIS — R0600 Dyspnea, unspecified: Secondary | ICD-10-CM | POA: Diagnosis not present

## 2016-03-31 DIAGNOSIS — R069 Unspecified abnormalities of breathing: Secondary | ICD-10-CM | POA: Diagnosis not present

## 2016-03-31 DIAGNOSIS — R112 Nausea with vomiting, unspecified: Secondary | ICD-10-CM | POA: Diagnosis not present

## 2016-03-31 DIAGNOSIS — R0602 Shortness of breath: Secondary | ICD-10-CM | POA: Diagnosis not present

## 2016-03-31 DIAGNOSIS — J449 Chronic obstructive pulmonary disease, unspecified: Secondary | ICD-10-CM | POA: Diagnosis not present

## 2016-04-09 DIAGNOSIS — R51 Headache: Secondary | ICD-10-CM | POA: Diagnosis not present

## 2016-04-09 DIAGNOSIS — E860 Dehydration: Secondary | ICD-10-CM | POA: Diagnosis not present

## 2016-04-09 DIAGNOSIS — I6789 Other cerebrovascular disease: Secondary | ICD-10-CM | POA: Diagnosis not present

## 2016-04-09 DIAGNOSIS — R4781 Slurred speech: Secondary | ICD-10-CM | POA: Diagnosis not present

## 2016-04-09 DIAGNOSIS — R0902 Hypoxemia: Secondary | ICD-10-CM | POA: Diagnosis not present

## 2016-04-09 DIAGNOSIS — R479 Unspecified speech disturbances: Secondary | ICD-10-CM | POA: Diagnosis not present

## 2016-04-30 DIAGNOSIS — J449 Chronic obstructive pulmonary disease, unspecified: Secondary | ICD-10-CM | POA: Diagnosis not present

## 2016-05-31 DIAGNOSIS — J449 Chronic obstructive pulmonary disease, unspecified: Secondary | ICD-10-CM | POA: Diagnosis not present

## 2016-06-18 DIAGNOSIS — Z23 Encounter for immunization: Secondary | ICD-10-CM | POA: Diagnosis not present

## 2016-06-28 DIAGNOSIS — H353191 Nonexudative age-related macular degeneration, unspecified eye, early dry stage: Secondary | ICD-10-CM | POA: Diagnosis not present

## 2016-06-28 DIAGNOSIS — H26493 Other secondary cataract, bilateral: Secondary | ICD-10-CM | POA: Diagnosis not present

## 2016-06-28 DIAGNOSIS — H1045 Other chronic allergic conjunctivitis: Secondary | ICD-10-CM | POA: Diagnosis not present

## 2016-06-30 DIAGNOSIS — M79672 Pain in left foot: Secondary | ICD-10-CM | POA: Diagnosis not present

## 2016-06-30 DIAGNOSIS — J449 Chronic obstructive pulmonary disease, unspecified: Secondary | ICD-10-CM | POA: Diagnosis not present

## 2016-06-30 DIAGNOSIS — M7989 Other specified soft tissue disorders: Secondary | ICD-10-CM | POA: Diagnosis not present

## 2018-10-28 ENCOUNTER — Emergency Department (HOSPITAL_COMMUNITY): Payer: Medicare Other

## 2018-10-28 ENCOUNTER — Emergency Department (HOSPITAL_COMMUNITY)
Admission: EM | Admit: 2018-10-28 | Discharge: 2018-10-28 | Disposition: A | Discharge status: Home / Self Care | Payer: Medicare Other | Attending: Emergency Medicine | Admitting: Emergency Medicine

## 2018-10-28 ENCOUNTER — Encounter (HOSPITAL_COMMUNITY): Payer: Self-pay | Admitting: *Deleted

## 2018-10-28 ENCOUNTER — Other Ambulatory Visit: Payer: Self-pay

## 2018-10-28 DIAGNOSIS — W07XXXA Fall from chair, initial encounter: Secondary | ICD-10-CM | POA: Insufficient documentation

## 2018-10-28 DIAGNOSIS — Y999 Unspecified external cause status: Secondary | ICD-10-CM | POA: Insufficient documentation

## 2018-10-28 DIAGNOSIS — Z79899 Other long term (current) drug therapy: Secondary | ICD-10-CM | POA: Diagnosis not present

## 2018-10-28 DIAGNOSIS — Y929 Unspecified place or not applicable: Secondary | ICD-10-CM | POA: Diagnosis not present

## 2018-10-28 DIAGNOSIS — J449 Chronic obstructive pulmonary disease, unspecified: Secondary | ICD-10-CM | POA: Insufficient documentation

## 2018-10-28 DIAGNOSIS — S300XXA Contusion of lower back and pelvis, initial encounter: Secondary | ICD-10-CM | POA: Insufficient documentation

## 2018-10-28 DIAGNOSIS — Y939 Activity, unspecified: Secondary | ICD-10-CM | POA: Diagnosis not present

## 2018-10-28 DIAGNOSIS — Z87891 Personal history of nicotine dependence: Secondary | ICD-10-CM | POA: Insufficient documentation

## 2018-10-28 DIAGNOSIS — Z7982 Long term (current) use of aspirin: Secondary | ICD-10-CM | POA: Insufficient documentation

## 2018-10-28 DIAGNOSIS — E079 Disorder of thyroid, unspecified: Secondary | ICD-10-CM | POA: Diagnosis not present

## 2018-10-28 DIAGNOSIS — I10 Essential (primary) hypertension: Secondary | ICD-10-CM | POA: Diagnosis not present

## 2018-10-28 MED ORDER — PANTOPRAZOLE SODIUM 40 MG PO TBEC
40.00 | DELAYED_RELEASE_TABLET | ORAL | Status: DC
Start: 2018-10-27 — End: 2018-10-28

## 2018-10-28 MED ORDER — ENOXAPARIN SODIUM 40 MG/0.4ML ~~LOC~~ SOLN
40.00 | SUBCUTANEOUS | Status: DC
Start: 2018-10-27 — End: 2018-10-28

## 2018-10-28 MED ORDER — ALBUTEROL SULFATE (2.5 MG/3ML) 0.083% IN NEBU
2.50 | INHALATION_SOLUTION | RESPIRATORY_TRACT | Status: DC
Start: ? — End: 2018-10-28

## 2018-10-28 MED ORDER — LISINOPRIL 10 MG PO TABS
10.00 | ORAL_TABLET | ORAL | Status: DC
Start: 2018-10-27 — End: 2018-10-28

## 2018-10-28 MED ORDER — ACETAMINOPHEN 325 MG PO TABS
650.00 | ORAL_TABLET | ORAL | Status: DC
Start: ? — End: 2018-10-28

## 2018-10-28 MED ORDER — ASPIRIN EC 81 MG PO TBEC
81.00 | DELAYED_RELEASE_TABLET | ORAL | Status: DC
Start: 2018-10-27 — End: 2018-10-28

## 2018-10-28 MED ORDER — LEVOTHYROXINE SODIUM 25 MCG PO TABS
50.00 | ORAL_TABLET | ORAL | Status: DC
Start: 2018-10-27 — End: 2018-10-28

## 2018-10-28 MED ORDER — ESCITALOPRAM OXALATE 10 MG PO TABS
10.00 | ORAL_TABLET | ORAL | Status: DC
Start: 2018-10-27 — End: 2018-10-28

## 2018-10-28 MED ORDER — BENZONATATE 100 MG PO CAPS
100.00 | ORAL_CAPSULE | ORAL | Status: DC
Start: ? — End: 2018-10-28

## 2018-10-28 MED ORDER — POLYETHYLENE GLYCOL 3350 17 G PO PACK
17.00 | PACK | ORAL | Status: DC
Start: 2018-10-26 — End: 2018-10-28

## 2018-10-28 NOTE — ED Provider Notes (Signed)
[facility] EMERGENCY DEPARTMENT Provider Note   CSN: [id number] Arrival date & time: 10/28/18  1028    History   Chief Complaint Chief Complaint  Patient presents with  . Fall    HPI Stephenia Vogan Tingley is a 85 y.o. female.     Patient s/p fall from wheelchair this AM. Patient was observed to slid down chair to floor. Did not hit head. No loc noted. Pts mental status post fall has remained c/w baseline. No nv. Pt notes lower back pain post fall, dull, mild, non radiating. No numbness/weakness. Pt denies other pain or injury. No anticoag use. Skin is intact.   The history is provided by the patient.  Fall  Pertinent negatives include no chest pain, no abdominal pain, no headaches and no shortness of breath.    Past Medical History:  Diagnosis Date  . Abnormality of gait   . Anemia   . Bowel obstruction (HCC)   . COPD (chronic obstructive pulmonary disease) (HCC)   . Depression   . Fracture of coccyx (HCC)    history  . GERD (gastroesophageal reflux disease)   . Hard of hearing   . Hypertension   . Left shoulder pain    DJD  . Shortness of breath dyspnea   . Stroke [facility])    with h/o dysphagia post stroke  . Thyroid disease   . Tobacco abuse   . UTI (lower urinary tract infection)    Enterobacter aerogenes  . Wheelchair bound     Patient Active Problem List   Diagnosis Date Noted  . Iron deficiency anemia 10/20/2015  . Panlobular emphysema (HCC)   . Esophageal ulcer 01/29/2015  . Duodenal ulcer 01/29/2015  . Esophageal dysmotility 01/29/2015  . Esophageal ulceration   . Hypoxia 01/25/2015  . Nausea and vomiting 01/25/2015  . SOB (shortness of breath) 01/25/2015  . Hypoxemia 01/25/2015  . Fracture of multiple ribs 08/14/2014  . Osteopenia 08/14/2014  . Chest pain 08/13/2014  . GERD (gastroesophageal reflux disease) 08/13/2014  . Anemia 08/13/2014  . Depression 08/13/2014  . History of stroke 08/13/2014  . Hypertension 08/13/2014  .  Thyroid disease 08/13/2014  . COPD (chronic obstructive pulmonary disease) (HCC) 08/13/2014  . Dysphagia 08/13/2014  . Right flank pain 08/13/2014  . Mid back pain 08/13/2014  . Abnormality of gait 08/13/2014  . Tobacco abuse 08/13/2014    Past Surgical History:  Procedure Laterality Date  . ABDOMINAL HYSTERECTOMY    . BALLOON DILATION N/A 01/28/2015   Procedure: BALLOON DILATION;  Surgeon: Beverley Fiedler, MD;  Location: [facility] ENDOSCOPY;  Service: Endoscopy;  Laterality: N/A;  . CATARACT EXTRACTION    . COLONOSCOPY WITH PROPOFOL N/A 10/21/2015   Procedure: COLONOSCOPY WITH PROPOFOL;  Surgeon: Sherrilyn Rist, MD;  Location: [facility] ENDOSCOPY;  Service: Gastroenterology;  Laterality: N/A;  . ESOPHAGOGASTRODUODENOSCOPY N/A 01/28/2015   Procedure: ESOPHAGOGASTRODUODENOSCOPY (EGD);  Surgeon: Beverley Fiedler, MD;  Location: [facility] ENDOSCOPY;  Service: Endoscopy;  Laterality: N/A;  . ESOPHAGOGASTRODUODENOSCOPY (EGD) WITH PROPOFOL N/A 10/21/2015   Procedure: ESOPHAGOGASTRODUODENOSCOPY (EGD) WITH PROPOFOL;  Surgeon: Sherrilyn Rist, MD;  Location: [facility] ENDOSCOPY;  Service: Gastroenterology;  Laterality: N/A;  . GASTRECTOMY  In her 40s versus 50s.   for ulcer.  No details as to the extent of surgery.  Marland Kitchen HERNIA REPAIR    . HIP FRACTURE SURGERY    . JOINT REPLACEMENT     b/l hips     OB History   No obstetric history  on file.      Home Medications    Prior to Admission medications   Medication Sig Start Date End Date Taking? Authorizing Provider  acetaminophen (TYLENOL) 325 MG tablet Take 325 mg by mouth every 6 (six) hours as needed for moderate pain.    [provider]  albuterol (PROVENTIL) (2.5 MG/3ML) 0.083% nebulizer solution Take 3 mLs (2.5 mg total) by nebulization every 6 (six) hours as needed for wheezing or shortness of breath. Patient not taking: Reported on 10/14/2015 01/29/15   Marinda Elk, MD  aspirin EC 81 MG tablet Take 1 tablet (81 mg total) by mouth daily. 02/12/15   Marinda Elk, MD  benazepril (LOTENSIN) 10 MG tablet Take 5 mg by mouth every morning.  02/28/14   [provider]  calcium carbonate (OS-CAL - DOSED IN MG OF ELEMENTAL CALCIUM) 1250 MG tablet Take 1 tablet (500 mg of elemental calcium total) by mouth 2 (two) times daily with a meal. Patient not taking: Reported on 10/14/2015 08/14/14   Marrian Salvage, MD  CALCIUM-VITAMIN D PO Take 1 tablet by mouth every morning.    [provider]  escitalopram (LEXAPRO) 10 MG tablet Take 10 mg by mouth every morning.    [provider]  ferrous sulfate 325 (65 FE) MG tablet Take 325 mg by mouth daily with breakfast.    [provider]  ipratropium-albuterol (DUONEB) 0.5-2.5 (3) MG/3ML SOLN Take 3 mLs by nebulization 2 (two) times daily.    [provider]  levothyroxine (SYNTHROID, LEVOTHROID) 25 MCG tablet Take 25 mcg by mouth daily before breakfast.    [provider]  ondansetron (ZOFRAN) 4 MG tablet 1 TABLET BY MOUTH3 TIMES A DAY AS NEEDED FOR NAUSEA 10/06/15   [provider]  pantoprazole (PROTONIX) 40 MG tablet Take 40 mg by mouth daily.    [provider]  protective barrier (RESTORE) CREA Apply 1 application topically every other day. To backside after showers.    [provider]    Family History Family History  Problem Relation Age of Onset  . Heart disease Mother        died age 11 MI  . Hypertension Mother   . Alcohol abuse Father   . Cancer Other        sister, 1 brother (lung) 1 brother (colon), daughter x 2 deceased 1 died age 80 pancreatitic another died age 39 lung  . Diabetes Son   . COPD Son     Social History Social History   Tobacco Use  . Smoking status: Former Smoker    Types: Cigarettes    Last attempt to quit: 05/26/2014    Years since quitting: 4.4  . Smokeless tobacco: Former Engineer, water Use Topics  . Alcohol use: No    Alcohol/week: 0.0 standard drinks  . Drug use: No      Allergies   Patient has no known allergies.   Review of Systems Review of Systems  Constitutional: Negative for fever.  HENT: Negative for nosebleeds.   Eyes: Negative for pain and redness.  Respiratory: Negative for shortness of breath.   Cardiovascular: Negative for chest pain.  Gastrointestinal: Negative for abdominal pain and vomiting.  Genitourinary: Negative for flank pain.  Musculoskeletal: Negative for back pain and neck pain.  Skin: Negative for wound.  Neurological: Negative for headaches.  Hematological: Does not bruise/bleed easily.  Psychiatric/Behavioral: The patient is not nervous/anxious.      Physical Exam Updated Vital  Signs BP (!) 170/82 (BP Location: Right Arm)   Pulse 74   Temp 97.9 F (36.6 C) (Oral)   Resp 16   Ht 1.499 m (4\' 11" )   Wt 38.6 kg   SpO2 (!) 86%   BMI 17.17 kg/m   Physical Exam Vitals signs and nursing note reviewed.  Constitutional:      Appearance: Normal appearance. She is well-developed.  HENT:     Head: Atraumatic.     Nose: Nose normal.     Mouth/Throat:     Mouth: Mucous membranes are moist.  Eyes:     General: No scleral icterus.    Conjunctiva/sclera: Conjunctivae normal.     Pupils: Pupils are equal, round, and reactive to light.  Neck:     Musculoskeletal: Normal range of motion and neck supple. No neck rigidity or muscular tenderness.     Trachea: No tracheal deviation.  Cardiovascular:     Rate and Rhythm: Normal rate and regular rhythm.     Pulses: Normal pulses.     Heart sounds: Normal heart sounds. No murmur. No friction rub. No gallop.   Pulmonary:     Effort: Pulmonary effort is normal. No respiratory distress.     Breath sounds: Normal breath sounds.  Abdominal:     General: Bowel sounds are normal. There is no distension.     Palpations: Abdomen is soft. There is no mass.     Tenderness: There is no abdominal tenderness. There is no guarding or rebound.     Hernia: No hernia is present.   Genitourinary:    Comments: No cva tenderness.  Musculoskeletal:        General: No swelling.     Comments: Lumbar tenderness, and right lower back muscular tenderness, otherwise, CTLS spine, non tender, aligned, no step off. Good rom bil extremities without pain or focal bony tenderness. Distal pulses palp bil.   Skin:    General: Skin is warm and dry.     Findings: No rash.  Neurological:     Mental Status: She is alert.     Comments: Alert, speech normal. Motor intact bil, stre 5/5. sens grossly intact.   Psychiatric:        Mood and Affect: Mood normal.      ED Treatments / Results  Labs (all labs ordered are listed, but only abnormal results are displayed) Labs Reviewed - No data to display  EKG None  Radiology Dg Lumbar Spine Complete  Result Date: 10/28/2018 CLINICAL DATA:  Fall.  Pain. EXAM: LUMBAR SPINE - COMPLETE 4+ VIEW COMPARISON:  CT of 10/25/2018 FINDINGS: Convex left lumbar spine curvature. Bilateral proximal femur fixation. Osteopenia. Aortic and branch vessel atherosclerosis. Multilevel lower thoracic and lumbar vertebral body height loss, with relative sparing of L5 and L1, similar to the CT of 3 days ago. Other levels are mild to moderately decreased in height, including a moderate compression deformity at L3. No gross ventral canal encroachment. IMPRESSION: Similar multilevel vertebral body height loss compared to the CT of 10/25/2018. Electronically Signed   By: Jeronimo Greaves M.D.   On: 10/28/2018 12:30    Procedures Procedures (including critical care time)  Medications Ordered in ED Medications - No data to display   Initial Impression / Assessment and Plan / ED Course  I have reviewed the triage vital signs and the nursing notes.  Pertinent labs & imaging results that were available during my care of the patient were reviewed by me and considered in  my medical decision making (see chart for details).  Imaging ordered.  Reviewed nursing notes and  prior charts for additional history.   Acetaminophen po.  Xray reviewed - old compressions fxs, no acute change.   Recheck pt comfortable, no acute distress.   Final Clinical Impressions(s) / ED Diagnoses   Final diagnoses:  None    ED Discharge Orders    None       Cathren Laine, MD 10/28/18 1240

## 2018-10-28 NOTE — Discharge Instructions (Addendum)
It was our pleasure to provide your ER care today - we hope that you feel better.  Your xrays were read as demonstrating your previous compression fractures, but no acute injury was noted.   Take acetaminophen as need.   Follow up with primary care doctor.  Return to ER if worse, new symptoms, fevers, trouble breathing, other concern.

## 2018-10-28 NOTE — ED Triage Notes (Signed)
PT lives at home with Family . Family reports Pt had a witnessed fall by sliding out of her WC this morning Pt reports having RT lower back pain from fall. . Family  confirmed with EMS that Pt did not hit her head. Family did report Pt had a recent PNA. At base line Pt is HOH  And slurred speech .

## 2018-10-28 NOTE — ED Triage Notes (Signed)
TC to Family ,spoke with Daughter about transportation home. Daughter reported her Brother will come pick PT up fopr home.

## 2019-01-26 ENCOUNTER — Observation Stay (HOSPITAL_COMMUNITY)
Admission: EM | Admit: 2019-01-26 | Discharge: 2019-01-27 | Disposition: A | Discharge status: Home / Self Care | Payer: Medicare Other | Attending: Internal Medicine | Admitting: Internal Medicine

## 2019-01-26 ENCOUNTER — Encounter (HOSPITAL_COMMUNITY): Payer: Self-pay | Admitting: Emergency Medicine

## 2019-01-26 ENCOUNTER — Other Ambulatory Visit: Payer: Self-pay

## 2019-01-26 ENCOUNTER — Emergency Department (HOSPITAL_COMMUNITY): Payer: Medicare Other

## 2019-01-26 DIAGNOSIS — Z87891 Personal history of nicotine dependence: Secondary | ICD-10-CM | POA: Diagnosis not present

## 2019-01-26 DIAGNOSIS — Z1159 Encounter for screening for other viral diseases: Secondary | ICD-10-CM | POA: Diagnosis not present

## 2019-01-26 DIAGNOSIS — Z79899 Other long term (current) drug therapy: Secondary | ICD-10-CM | POA: Diagnosis not present

## 2019-01-26 DIAGNOSIS — Z8673 Personal history of transient ischemic attack (TIA), and cerebral infarction without residual deficits: Secondary | ICD-10-CM | POA: Diagnosis not present

## 2019-01-26 DIAGNOSIS — J449 Chronic obstructive pulmonary disease, unspecified: Secondary | ICD-10-CM | POA: Diagnosis not present

## 2019-01-26 DIAGNOSIS — R000 Tachycardia, unspecified: Secondary | ICD-10-CM | POA: Diagnosis not present

## 2019-01-26 DIAGNOSIS — Z7982 Long term (current) use of aspirin: Secondary | ICD-10-CM | POA: Insufficient documentation

## 2019-01-26 DIAGNOSIS — Z96643 Presence of artificial hip joint, bilateral: Secondary | ICD-10-CM | POA: Diagnosis not present

## 2019-01-26 DIAGNOSIS — I10 Essential (primary) hypertension: Secondary | ICD-10-CM | POA: Insufficient documentation

## 2019-01-26 DIAGNOSIS — R7989 Other specified abnormal findings of blood chemistry: Secondary | ICD-10-CM | POA: Diagnosis not present

## 2019-01-26 DIAGNOSIS — R079 Chest pain, unspecified: Secondary | ICD-10-CM | POA: Diagnosis present

## 2019-01-26 DIAGNOSIS — I214 Non-ST elevation (NSTEMI) myocardial infarction: Secondary | ICD-10-CM

## 2019-01-26 LAB — CBC WITH DIFFERENTIAL/PLATELET
Abs Immature Granulocytes: 0.01 10*3/uL (ref 0.00–0.07)
Basophils Absolute: 0.1 10*3/uL (ref 0.0–0.1)
Basophils Relative: 2 %
Eosinophils Absolute: 0.2 10*3/uL (ref 0.0–0.5)
Eosinophils Relative: 3 %
HCT: 40.7 % (ref 36.0–46.0)
Hemoglobin: 12.1 g/dL (ref 12.0–15.0)
Immature Granulocytes: 0 %
Lymphocytes Relative: 25 %
Lymphs Abs: 1.4 10*3/uL (ref 0.7–4.0)
MCH: 26.7 pg (ref 26.0–34.0)
MCHC: 29.7 g/dL — ABNORMAL LOW (ref 30.0–36.0)
MCV: 89.6 fL (ref 80.0–100.0)
Monocytes Absolute: 0.5 10*3/uL (ref 0.1–1.0)
Monocytes Relative: 9 %
Neutro Abs: 3.4 10*3/uL (ref 1.7–7.7)
Neutrophils Relative %: 61 %
Platelets: 204 10*3/uL (ref 150–400)
RBC: 4.54 MIL/uL (ref 3.87–5.11)
RDW: 13.2 % (ref 11.5–15.5)
WBC: 5.5 10*3/uL (ref 4.0–10.5)
nRBC: 0 % (ref 0.0–0.2)

## 2019-01-26 LAB — BASIC METABOLIC PANEL
Anion gap: 10 (ref 5–15)
BUN: 17 mg/dL (ref 8–23)
CO2: 26 mmol/L (ref 22–32)
Calcium: 10.8 mg/dL — ABNORMAL HIGH (ref 8.9–10.3)
Chloride: 109 mmol/L (ref 98–111)
Creatinine, Ser: 0.85 mg/dL (ref 0.44–1.00)
GFR calc Af Amer: >60 mL/min (ref >60)
GFR calc non Af Amer: >60 mL/min (ref >60)
Glucose, Bld: 101 mg/dL — ABNORMAL HIGH (ref 70–99)
Potassium: 3.5 mmol/L (ref 3.5–5.1)
Sodium: 145 mmol/L (ref 135–145)

## 2019-01-26 LAB — URINALYSIS, ROUTINE W REFLEX MICROSCOPIC
Bilirubin Urine: NEGATIVE
Glucose, UA: NEGATIVE mg/dL
Hgb urine dipstick: NEGATIVE
Ketones, ur: NEGATIVE mg/dL
Leukocytes,Ua: NEGATIVE
Nitrite: NEGATIVE
Protein, ur: NEGATIVE mg/dL
Specific Gravity, Urine: 1.015 (ref 1.005–1.030)
pH: 6 (ref 5.0–8.0)

## 2019-01-26 LAB — TROPONIN I
Troponin I: 0.06 ng/mL (ref <0.03)
Troponin I: 0.06 ng/mL (ref <0.03)

## 2019-01-26 LAB — TSH: TSH: 0.685 u[IU]/mL (ref 0.350–4.500)

## 2019-01-26 LAB — SARS CORONAVIRUS 2 BY RT PCR (HOSPITAL ORDER, PERFORMED IN ~~LOC~~ HOSPITAL LAB): SARS Coronavirus 2: NEGATIVE

## 2019-01-26 MED ORDER — ACETAMINOPHEN 325 MG PO TABS: 650.0000 mg | ORAL_TABLET | ORAL | Status: DC | PRN

## 2019-01-26 MED ORDER — LEVOTHYROXINE SODIUM 25 MCG PO TABS
25.0000 ug | ORAL_TABLET | Freq: Every day | ORAL | Status: DC
  Administered 2019-01-27: 25 ug via ORAL
  Filled 2019-01-26: qty 1

## 2019-01-26 MED ORDER — ENOXAPARIN SODIUM 40 MG/0.4ML ~~LOC~~ SOLN
40.0000 mg | SUBCUTANEOUS | Status: DC
  Administered 2019-01-26: 40 mg via SUBCUTANEOUS
  Filled 2019-01-26: qty 0.4

## 2019-01-26 MED ORDER — ESCITALOPRAM OXALATE 10 MG PO TABS
10.0000 mg | ORAL_TABLET | Freq: Every morning | ORAL | Status: DC
  Administered 2019-01-27: 10 mg via ORAL
  Filled 2019-01-26: qty 1

## 2019-01-26 MED ORDER — ASPIRIN 325 MG PO TABS: 325.0000 mg | ORAL_TABLET | Freq: Once | ORAL | Status: DC

## 2019-01-26 MED ORDER — BENAZEPRIL HCL 10 MG PO TABS
5.0000 mg | ORAL_TABLET | Freq: Every morning | ORAL | Status: DC
  Administered 2019-01-27: 5 mg via ORAL
  Filled 2019-01-26: qty 1

## 2019-01-26 MED ORDER — PANTOPRAZOLE SODIUM 40 MG PO TBEC
40.0000 mg | DELAYED_RELEASE_TABLET | Freq: Every day | ORAL | Status: DC
  Administered 2019-01-26 – 2019-01-27 (×2): 40 mg via ORAL
  Filled 2019-01-26 (×2): qty 1

## 2019-01-26 MED ORDER — CALCIUM CARBONATE 1250 (500 CA) MG PO TABS
1.0000 | ORAL_TABLET | Freq: Two times a day (BID) | ORAL | Status: DC
  Administered 2019-01-27: 500 mg via ORAL
  Filled 2019-01-26: qty 1

## 2019-01-26 MED ORDER — ASPIRIN EC 81 MG PO TBEC
81.0000 mg | DELAYED_RELEASE_TABLET | Freq: Every day | ORAL | Status: DC
  Administered 2019-01-27: 81 mg via ORAL
  Filled 2019-01-26: qty 1

## 2019-01-26 MED ORDER — FERROUS SULFATE 325 (65 FE) MG PO TABS
325.0000 mg | ORAL_TABLET | Freq: Every day | ORAL | Status: DC
  Administered 2019-01-27: 325 mg via ORAL
  Filled 2019-01-26: qty 1

## 2019-01-26 MED ORDER — ONDANSETRON HCL 4 MG/2ML IJ SOLN: 4.0000 mg | Freq: Four times a day (QID) | INTRAMUSCULAR | Status: DC | PRN

## 2019-01-26 NOTE — ED Notes (Signed)
With patient's permission, spoke with patient's daughter [name] and updated her on patient's plan of care. Advised staff would keep her updated once a decision was made on admission.

## 2019-01-26 NOTE — ED Provider Notes (Signed)
[facility] EMERGENCY DEPARTMENT Provider Note   CSN: [id number] Arrival date & time: 01/26/19  1227    History   Chief Complaint Chief Complaint  Patient presents with  . Tachycardia    HPI April Mccoy is a 85 y.o. female.     The history is provided by the EMS personnel, the patient and medical records. No language interpreter was used.     85 year old female with history of iron deficiency anemia, wheelchair-bound brought here via EMS for evaluation of fast heart rate.  Per EMS, patient has a CNA and a physical therapist that takes care of her for the past couple of years.  Today during the regular checkup, both her and found patient's heart rate was fast with a rate in the 180s.  EMS arrived, and noted that heart rate was at 100 and normal sinus rhythm.  Patient without any significant complaints aside from mild heart fluttering.  She was sent here for further evaluation.  Patient does not have any history of atrial fibrillation.  She lives at an assisted living facility and no recent sick contact.  She does have history of COPD and wears oxygen at night.  Patient is hard of hearing therefore history was difficult to obtain.  She denies any significant pain or discomfort at this time.  No report of lightheadedness or dizziness no chest pain or trouble breathing no abdominal pain.  Past Medical History:  Diagnosis Date  . Abnormality of gait   . Anemia   . Bowel obstruction (HCC)   . COPD (chronic obstructive pulmonary disease) (HCC)   . Depression   . Fracture of coccyx (HCC)    history  . GERD (gastroesophageal reflux disease)   . Hard of hearing   . Hypertension   . Left shoulder pain    DJD  . Shortness of breath dyspnea   . Stroke [facility])    with h/o dysphagia post stroke  . Thyroid disease   . Tobacco abuse   . UTI (lower urinary tract infection)    Enterobacter aerogenes  . Wheelchair bound     Patient Active Problem List   Diagnosis  Date Noted  . Iron deficiency anemia 10/20/2015  . Panlobular emphysema (HCC)   . Esophageal ulcer 01/29/2015  . Duodenal ulcer 01/29/2015  . Esophageal dysmotility 01/29/2015  . Esophageal ulceration   . Hypoxia 01/25/2015  . Nausea and vomiting 01/25/2015  . SOB (shortness of breath) 01/25/2015  . Hypoxemia 01/25/2015  . Fracture of multiple ribs 08/14/2014  . Osteopenia 08/14/2014  . Chest pain 08/13/2014  . GERD (gastroesophageal reflux disease) 08/13/2014  . Anemia 08/13/2014  . Depression 08/13/2014  . History of stroke 08/13/2014  . Hypertension 08/13/2014  . Thyroid disease 08/13/2014  . COPD (chronic obstructive pulmonary disease) (HCC) 08/13/2014  . Dysphagia 08/13/2014  . Right flank pain 08/13/2014  . Mid back pain 08/13/2014  . Abnormality of gait 08/13/2014  . Tobacco abuse 08/13/2014    Past Surgical History:  Procedure Laterality Date  . ABDOMINAL HYSTERECTOMY    . BALLOON DILATION N/A 01/28/2015   Procedure: BALLOON DILATION;  Surgeon: Beverley Fiedler, MD;  Location[facility] ENDOSCOPY;  Service: Endoscopy;  Laterality: N/A;  . CATARACT EXTRACTION    . COLONOSCOPY WITH PROPOFOL N/A 10/21/2015   Procedure: COLONOSCOPY WITH PROPOFOL;  Surgeon: Sherrilyn Rist, MD;  Location: [facility] ENDOSCOPY;  Service: Gastroenterology;  Laterality: N/A;  . ESOPHAGOGASTRODUODENOSCOPY N/A 01/28/2015   Procedure: ESOPHAGOGASTRODUODENOSCOPY (EGD);  Surgeon: Beverley Fiedler, MD;  Location: [facility] ENDOSCOPY;  Service: Endoscopy;  Laterality: N/A;  . ESOPHAGOGASTRODUODENOSCOPY (EGD) WITH PROPOFOL N/A 10/21/2015   Procedure: ESOPHAGOGASTRODUODENOSCOPY (EGD) WITH PROPOFOL;  Surgeon: Sherrilyn Rist, MD;  Location: [facility] ENDOSCOPY;  Service: Gastroenterology;  Laterality: N/A;  . GASTRECTOMY  In her 40s versus 50s.   for ulcer.  No details as to the extent of surgery.  Marland Kitchen HERNIA REPAIR    . HIP FRACTURE SURGERY    . JOINT REPLACEMENT     b/l hips     OB History   No obstetric history on file.       Home Medications    Prior to Admission medications   Medication Sig Start Date End Date Taking? Authorizing Provider  acetaminophen (TYLENOL) 325 MG tablet Take 325 mg by mouth every 6 (six) hours as needed for moderate pain.    [provider]  albuterol (PROVENTIL) (2.5 MG/3ML) 0.083% nebulizer solution Take 3 mLs (2.5 mg total) by nebulization every 6 (six) hours as needed for wheezing or shortness of breath. Patient not taking: Reported on 10/14/2015 01/29/15   Marinda Elk, MD  aspirin EC 81 MG tablet Take 1 tablet (81 mg total) by mouth daily. 02/12/15   Marinda Elk, MD  benazepril (LOTENSIN) 10 MG tablet Take 5 mg by mouth every morning.  02/28/14   [provider]  calcium carbonate (OS-CAL - DOSED IN MG OF ELEMENTAL CALCIUM) 1250 MG tablet Take 1 tablet (500 mg of elemental calcium total) by mouth 2 (two) times daily with a meal. Patient not taking: Reported on 10/14/2015 08/14/14   Marrian Salvage, MD  CALCIUM-VITAMIN D PO Take 1 tablet by mouth every morning.    [provider]  escitalopram (LEXAPRO) 10 MG tablet Take 10 mg by mouth every morning.    [provider]  ferrous sulfate 325 (65 FE) MG tablet Take 325 mg by mouth daily with breakfast.    [provider]  ipratropium-albuterol (DUONEB) 0.5-2.5 (3) MG/3ML SOLN Take 3 mLs by nebulization 2 (two) times daily.    [provider]  levothyroxine (SYNTHROID, LEVOTHROID) 25 MCG tablet Take 25 mcg by mouth daily before breakfast.    [provider]  ondansetron (ZOFRAN) 4 MG tablet 1 TABLET BY MOUTH3 TIMES A DAY AS NEEDED FOR NAUSEA 10/06/15   [provider]  pantoprazole (PROTONIX) 40 MG tablet Take 40 mg by mouth daily.    [provider]  protective barrier (RESTORE) CREA Apply 1 application topically every other day. To backside after showers.    [provider]    Family History Family History  Problem Relation Age of  Onset  . Heart disease Mother        died age 19 MI  . Hypertension Mother   . Alcohol abuse Father   . Cancer Other        sister, 1 brother (lung) 1 brother (colon), daughter x 2 deceased 1 died age 63 pancreatitic another died age 42 lung  . Diabetes Son   . COPD Son     Social History Social History   Tobacco Use  . Smoking status: Former Smoker    Types: Cigarettes    Last attempt to quit: 05/26/2014    Years since quitting: 4.6  . Smokeless tobacco: Former Engineer, water Use Topics  . Alcohol use: No    Alcohol/week: 0.0 standard drinks  . Drug use: No  Allergies   Patient has no known allergies.   Review of Systems Review of Systems  All other systems reviewed and are negative.    Physical Exam Updated Vital Signs BP (!) 124/94   Pulse 60   Temp 98.5 F (36.9 C) (Oral)   Resp (!) 23   SpO2 97%   Physical Exam Vitals signs and nursing note reviewed.  Constitutional:      General: She is not in acute distress.    Appearance: She is well-developed.     Comments: Elderly female resting comfortably in no acute discomfort.  HENT:     Head: Atraumatic.     Mouth/Throat:     Mouth: Mucous membranes are moist.  Eyes:     Conjunctiva/sclera: Conjunctivae normal.  Neck:     Musculoskeletal: Neck supple.  Cardiovascular:     Rate and Rhythm: Normal rate and regular rhythm.     Heart sounds: Normal heart sounds.  Pulmonary:     Breath sounds: Normal breath sounds.  Abdominal:     Palpations: Abdomen is soft.     Tenderness: There is no abdominal tenderness.  Musculoskeletal: Normal range of motion.  Skin:    Findings: No rash.  Neurological:     Mental Status: She is alert.     Comments: Patient is alert to self, place, month but not to year.  Psychiatric:        Mood and Affect: Mood normal.      ED Treatments / Results  Labs (all labs ordered are listed, but only abnormal results are displayed) Labs Reviewed  CBC WITH  DIFFERENTIAL/PLATELET - Abnormal; Notable for the following components:      Result Value   MCHC 29.7 (*)    All other components within normal limits  BASIC METABOLIC PANEL - Abnormal; Notable for the following components:   Glucose, Bld 101 (*)    Calcium 10.8 (*)    All other components within normal limits  TROPONIN I - Abnormal; Notable for the following components:   Troponin I 0.06 (*)    All other components within normal limits  SARS CORONAVIRUS 2 (HOSPITAL ORDER, PERFORMED IN [facility] LAB)  URINALYSIS, ROUTINE W REFLEX MICROSCOPIC    EKG EKG Interpretation  Date/Time:  Friday Jan 26 2019 12:33:34 EDT Ventricular Rate:  77 PR Interval:    QRS Duration: 99 QT Interval:  401 QTC Calculation: 454 R Axis:   32 Text Interpretation:  Normal sinus rhythm Non-specific ST-t changes Confirmed by Margarita Grizzle [phone]) on 01/26/2019 1:55:32 PM   ED ECG REPORT   Date: 01/26/2019  Rate: 77  Rhythm: normal sinus rhythm  QRS Axis: normal  Intervals: normal  ST/T Wave abnormalities: ST depressions anteriorly  Conduction Disutrbances:none  Narrative Interpretation:   Old EKG Reviewed: changes noted  I have personally reviewed the EKG tracing and agree with the computerized printout as noted.   Radiology Dg Chest Portable 1 View  Result Date: 01/26/2019 CLINICAL DATA:  Chest pain. EXAM: PORTABLE CHEST 1 VIEW COMPARISON:  Radiographs of November 06, 2018. FINDINGS: The heart size and mediastinal contours are within normal limits. Atherosclerosis of thoracic aorta is noted. No pneumothorax or pleural effusion is noted. Both lungs are clear. The visualized skeletal structures are unremarkable. IMPRESSION: No active disease. Aortic Atherosclerosis (ICD10-I70.0). Electronically Signed   By: Lupita Raider M.D.   On: 01/26/2019 14:24    Procedures .Critical Care Performed by: Fayrene Helper, PA-C Authorized by: Fayrene Helper, PA-C  Critical care provider statement:     Critical care time (minutes):  45   Critical care was time spent personally by me on the following activities:  Discussions with consultants, evaluation of patient's response to treatment, examination of patient, ordering and performing treatments and interventions, ordering and review of laboratory studies, ordering and review of radiographic studies, pulse oximetry, re-evaluation of patient's condition, obtaining history from patient or surrogate and review of old charts   (including critical care time)  Medications Ordered in ED Medications - No data to display   Initial Impression / Assessment and Plan / ED Course  I have reviewed the triage vital signs and the nursing notes.  Pertinent labs & imaging results that were available during my care of the patient were reviewed by me and considered in my medical decision making (see chart for details).        BP (!) 124/94   Pulse 60   Temp 98.5 F (36.9 C) (Oral)   Resp (!) 23   SpO2 97%    Final Clinical Impressions(s) / ED Diagnoses   Final diagnoses:  NSTEMI (non-ST elevated myocardial infarction) [facility](HCC)    ED Discharge Orders    None     12:53 PM Patient presents to the ED today due to having elevated heart rate by her nurse aide as well as a physical therapist who saw her earlier today.  States initially heart rate was at the 180s however currently her heart rate is 75 and her blood pressure is normal patient resting comfortably in no acute discomfort.  I did discuss patient care with patient's daughter via the phone.  Daughter mention patient was complaining of chest discomfort yesterday which she normally does not complain of such.  She does not have any pain today however, will include that in the work-up.  1:52 PM  Mild elevated troponin of 0.06 in the ED.  EKG with mild ST depression to the anterior lead. Appreciate consultation with cardiology who will see pt and help determine dispo and treatment.  Care discussed  with Dr. Rosalia Hammersay.   2:37 PM Patient remained chest pain-free.  Will consult medicine for admission.  2:43 PM Appreciate consultation from Internal Medicine resident who agrees to see and admit pt for NSTEMI.  Will await recommendation of cardiology on heparinizing this pt.    Henderson NewcomerVallie M Gadea was evaluated in Emergency Department on 01/26/2019 for the symptoms described in the history of present illness. She was evaluated in the context of the global COVID-19 pandemic, which necessitated consideration that the patient might be at risk for infection with the SARS-CoV-2 virus that causes COVID-19. Institutional protocols and algorithms that pertain to the evaluation of patients at risk for COVID-19 are in a state of rapid change based on information released by regulatory bodies including the CDC and federal and state organizations. These policies and algorithms were followed during the patient's care in the ED.     Fayrene Helperran, Raiven Belizaire, PA-C 01/26/19 1444    Margarita Grizzleay, Danielle, MD 01/26/19 [phone]

## 2019-01-26 NOTE — ED Triage Notes (Addendum)
Patient arrives via [facility] from home where patient's cna and physical therapist had both checked patient's heart rate and found it to be in the 180s with patient endorsing some "fluttering." upon ems arrival, patient's HR 100 bpm and NSR. No hx of afib. Pt lives in assisted living apartment community but denies any recent sick contacts. Patient has hx of copd and wear O2 at night, was 90% on room air, placed on 2L n.c with increase of sat to 100%, bp 118/62. Pt alert/oriented to person, place and month. resp e/u, nad.

## 2019-01-26 NOTE — ED Notes (Signed)
Patient's daughter [name] is aware that patient is here in the ED, left contact information with EMS so she can be updated when possible. [phone]

## 2019-01-26 NOTE — H&P (Signed)
Date: 01/26/2019               Patient Name:  April NewcomerVallie M Ganser MRN: [id number]  DOB: 08/27/1929 Age / Sex: 85 y.o., female   PCP: Ailene RavelHamrick, Maura L, MD         Medical Service: Internal Medicine Teaching Service         Attending Physician: Dr. Inez CatalinaMullen, Emily B, MD    First Contact: Dr. Judeth CornfieldLee, Laterica Matarazzo Pager: [phone]  Second Contact: Dr. Nelson ChimesSantos, Idalys Pager: [phone]       After Hours (After 5p/  First Contact Pager: [phone]  weekends / holidays): Second Contact Pager: [phone]   Chief Complaint: Tachycardia  History of Present Illness:  Mrs.Knipp is a 85 yo F w/ PMH of HTN, COPD, GERD and CVA presenting w/ chest pain and tachycardia. She has difficulty hearing and most of her history was obtained from conversation with her daughter. She states that she was in her usual state of health until last night when she began to endorse significant chest pain. She was unable to get description, duration, radiation or exact location of the chest pain but per her aid she never complains about anything so it is likely to have been pretty severe. CNA visited her today and noted that her heart rate was >160s. Ems was called who arrived and also confirmed that she was tachycardic and [facility]   On review of system, she denies any chest pain, palpitations, dyspnea, nausea, vomiting, diaphoresis. She states everything is fine. When asked about her episode of chest pain yesterday, she states she does not recall.  In the ED, she was found to have elevated troponin of 0.06 w/o significant ST changes. Cardiology was consulted. IMTS was consulted for admission.   Meds: Current Meds  Medication Sig  . acetaminophen (TYLENOL) 325 MG tablet Take 325 mg by mouth every 6 (six) hours as needed for moderate pain.  Marland Kitchen. albuterol (PROVENTIL) (2.5 MG/3ML) 0.083% nebulizer solution Take 3 mLs (2.5 mg total) by nebulization every 6 (six) hours as needed for wheezing or shortness of breath.   Marland Kitchen. aspirin EC 81 MG tablet Take 1 tablet (81 mg total) by mouth daily.  . benazepril (LOTENSIN) 10 MG tablet Take 5 mg by mouth every morning.   . calcium carbonate (OS-CAL - DOSED IN MG OF ELEMENTAL CALCIUM) 1250 MG tablet Take 1 tablet (500 mg of elemental calcium total) by mouth 2 (two) times daily with a meal.  . escitalopram (LEXAPRO) 10 MG tablet Take 10 mg by mouth every morning.  . ferrous sulfate 325 (65 FE) MG tablet Take 325 mg by mouth daily with breakfast.  . ipratropium-albuterol (DUONEB) 0.5-2.5 (3) MG/3ML SOLN Take 3 mLs by nebulization 2 (two) times daily.  Marland Kitchen. levothyroxine (SYNTHROID, LEVOTHROID) 25 MCG tablet Take 25 mcg by mouth daily before breakfast.  . ondansetron (ZOFRAN) 4 MG tablet Take 4 mg by mouth every 8 (eight) hours as needed for nausea or vomiting.   . pantoprazole (PROTONIX) 40 MG tablet Take 40 mg by mouth daily.  Marland Kitchen. Propylene Glycol (SYSTANE BALANCE) 0.6 % SOLN Apply 1 drop to eye daily as needed (dry eyes).  . protective barrier (RESTORE) CREA Apply 1 application topically every other day. To backside after showers.   Allergies: Allergies as of 01/26/2019  . (No Known Allergies)   Past Medical History:  Diagnosis Date  . Abnormality of gait   . Anemia   . Bowel  obstruction (HCC)   . COPD (chronic obstructive pulmonary disease) (HCC)   . Depression   . Fracture of coccyx (HCC)    history  . GERD (gastroesophageal reflux disease)   . Hard of hearing   . Hypertension   . Left shoulder pain    DJD  . Shortness of breath dyspnea   . Stroke [facility])    with h/o dysphagia post stroke  . Thyroid disease   . Tobacco abuse   . UTI (lower urinary tract infection)    Enterobacter aerogenes  . Wheelchair bound    Family History:   Family History  Problem Relation Age of Onset  . Heart disease Mother        died age 54 MI  . Hypertension Mother   . Alcohol abuse Father   . Cancer Other        sister, 1 brother (lung) 1 brother (colon), daughter x 2  deceased 1 died age 27 pancreatitic another died age 9 lung  . Diabetes Son   . COPD Son   . Heart Problems Daughter     Social History:  Lives at assisted living apartment community with visits from CNA and home physical therapist. No significant alcohol, tobacco, illicit substance use.  Social History   Tobacco Use  . Smoking status: Former Smoker    Types: Cigarettes    Last attempt to quit: 05/26/2014    Years since quitting: 4.6  . Smokeless tobacco: Former Engineer, water Use Topics  . Alcohol use: No    Alcohol/week: 0.0 standard drinks  . Drug use: No   Review of Systems: A complete ROS was negative except as per HPI.  Physical Exam: Blood pressure 117/81, pulse 60, temperature 98.5 F (36.9 C), temperature source Oral, resp. rate 15, SpO2 97 %. Gen: Cachetic appearing HEENT: EOMI, MMM CV: RRR, S1, S2 normal, No rubs, no murmurs, no gallops Pulm: Distant breath sounds, No rales, no wheezes, no dullness to percussion  Abd: Soft, BS+, NTND Extm: ROM intact, Peripheral pulses intact, No peripheral edema Skin: Dry, Warm, normal turgor, no rashes, lesions, wounds.  Neuro: Alert. Unable to answer orientation questions due to hearing difficulty  EKG: personally reviewed my interpretation is normal sinus, normal axis, no significant ST changes compared to prior  CXR: personally reviewed my interpretation is rotated film, scoliosis of spine noted, no lobar consolidation, pleural effusion or pulmonary edema  Assessment & Plan by Problem: Active Problems:   * No active hospital problems. *  Mrs.Gutkowski is a 85 yo F w/ PMH of HTN, COPD, HFpEF, GERD and CVA presenting w/ chest pain possibly 2/2 tachyarrhythmia. She was reported to have HR >160s but reported via 3rd party and her HR since presentation to the ED has been 60s-70s. Possible differential includes SVTs, A.fib/flutter, VTs.  She is unable to describe her episode of chest pain in detail due to auditory deficits.  Moderate risk of ACS w/ Heart score of 5 due to age, mild troponemia and hx of CVAs. Will be admitted to observation for ACS rule out with telemetry to observe for arrhythmia. Cardiology is on board.  Tachycardia 2/2 tachyarrhythmia vs erroneous reading Reported HR >180s per home health nurse. HR since admission <80s per telemetry. No previous hx of arrythmia. She does have hx of HFpEF from TEE in 2016 which puts her at higher risk for A.fib - C/w telemetry  ?Chest pain 2/2 ACS vs GERD vs MSK Reported by family but pt unable to recall  or describe. Heart score 5. EKG w/ no sign. change. Chest X-ray w/o significant findings. Initial trop 0.06. No prior cardiac hx. No chest wall tenderness. No prior hx of GERD.  - Appreciate cardiology recs - Aspirin   - Trend troponins - Repeat am EKG - Telemetry - TSH  Chronic hypoxic respiratory failure 2/2 COPD On 2L O2 at home at night. Current sat 93 on RA - C/w monitor - Albuterol PRN - Keep O2 sat >88  Chronic Diastolic Heart Failure TEE in 1610 show EF of 55-60 w/ grade 3 diastolic dysfunction. On benazepril . Not on diuretics, beta blockers due to soft pressures and low HR - C/w home med: benazepril  daily, aspirin  daily  Hypothyroidism Currently on levothyroxine . Last TSH checked 11/2018 2.27 - TSH - C/w home med: levothyroxine  DVT prophx: Lovenox Diet: Soft Code: DNR  Dispo: Admit patient to Observation with expected length of stay less than 2 midnights.  Signed: Theotis Barrio, MD 01/26/2019, 3:30 PM  Pager: [phone]

## 2019-01-26 NOTE — ED Notes (Signed)
This RN spoke with Ronie Spies, PA with cardiology and also assisted patient to speak with PA Dunn to obtain more information on why patient came to the ED. PA Dunn advised she was going to discuss patient's case with Dr. Katrinka Blazing and have him come and see patient in the ED.

## 2019-01-26 NOTE — Consult Note (Addendum)
The patient has been seen in conjunction with Ronie Spiesayna Dunn, PAC. All aspects of care have been considered and discussed. The patient has been personally interviewed, examined, and all clinical data has been reviewed.   Currently, no complaints.  The patient is not sure why she is here.  Please see detailed history below.  Troponin 0.06  EKG does not reveal any acute ischemic change.  Impression: Trace elevation in troponin of uncertain significance.  Recommend trending troponin levels.  This may be helpful in achieving a diagnosis but will likely not lead to ischemic evaluation given her overall poor condition and anticipated prognosis.  Also, continue to monitor to rule out arrhythmia.  If no arrhythmias or significant change in troponin we will have no further recommendations.   Cardiology Consultation:   Patient ID: April Mccoy; [id number]; 05/24/1929   Admit date: 01/26/2019 Date of Consult: 01/26/2019  Primary Care Provider: Ailene RavelHamrick, Maura L, MD Primary Cardiologist: New[place] Primary Electrophysiologist:  None  Chief Complaint: nurse/PT found HR elevated on examination  Patient Profile:   April Mccoy is a 85 y.o. female with a hx of several recent admissions at outside hospital, deconditioning, poor oral intake, partial small bowel obstruction, hypertension, COPD, hypothyroidism, iron deficiency anemia, and depression who is being seen today for the evaluation of elevated troponin at the request of B. Ardelle Parkran PA-C.  History of Present Illness:   At baseline she is extremely frail and deconditioned per outside notes. She is mostly in a wheelchair due to prior hip issues. In 10/24/2018 the patient was admitted with productive cough, stomach pain, vomiting, fever, and worsening confusion with possible SBO versus ileus. Discharge summary is not available. She was seen again in the ED 10/28/18 with a fall out of a wheelchair. She was then readmitted 11/06/18 with decline  in capability to care for self with weight loss, poor oral intake, and calling EMS multiple times over fear of being alone at night. She lives in ALF. Per EMS, patient has a CNA and a physical therapist that takes care of her for the past couple of years.  The patient is a poor historian and very hard of hearing. She has a PT and aide that come out to her house. They both checked the patient's HR and found it to be in the 180s with some fluttering. EMS was called. Upon EMS arrival her HR was reported to be 100bpm and NSR. POX was 90% on RA, coming up to normal 2L - although the patient states she does wear O2 periodically at home. HR has been in the 60s-70s here with normal BP. Tried to reach daughter but got no answer. Internal medicine was able to speak with her who did report her mother had an episode of chest pain yesterday when she normally does not complain about it, but she was not able to provide any other descriptors or duration. The patient here denies any chest pain or any other pain for that matter. She does not recall having recent chest pain. She denies any SOB or swelling. It's not clear that she was particularly symptomatic with the elevated HR as we get the sense she would not necessarily have come to the ER if it wasn't for the abnormal VS. Labs show K 3.5, Cr 0.85, calcium 10.8, CBC wnl, troponin 0.06. CXR NAD. She has had uneventful ED course.  Past Medical History:  Diagnosis Date  . Abnormality of gait   . Anemia   .  Bowel obstruction (HCC)   . COPD (chronic obstructive pulmonary disease) (HCC)   . Depression   . Fracture of coccyx (HCC)    history  . GERD (gastroesophageal reflux disease)   . Hard of hearing   . Hypertension   . Left shoulder pain    DJD  . Shortness of breath dyspnea   . Stroke [facility])    with h/o dysphagia post stroke  . Thyroid disease   . Tobacco abuse   . UTI (lower urinary tract infection)    Enterobacter aerogenes  . Wheelchair bound     Past  Surgical History:  Procedure Laterality Date  . ABDOMINAL HYSTERECTOMY    . BALLOON DILATION N/A 01/28/2015   Procedure: BALLOON DILATION;  Surgeon: Beverley Fiedler, MD;  Location: [facility] ENDOSCOPY;  Service: Endoscopy;  Laterality: N/A;  . CATARACT EXTRACTION    . COLONOSCOPY WITH PROPOFOL N/A 10/21/2015   Procedure: COLONOSCOPY WITH PROPOFOL;  Surgeon: Sherrilyn Rist, MD;  Location: [facility] ENDOSCOPY;  Service: Gastroenterology;  Laterality: N/A;  . ESOPHAGOGASTRODUODENOSCOPY N/A 01/28/2015   Procedure: ESOPHAGOGASTRODUODENOSCOPY (EGD);  Surgeon: Beverley Fiedler, MD;  Location: [facility] ENDOSCOPY;  Service: Endoscopy;  Laterality: N/A;  . ESOPHAGOGASTRODUODENOSCOPY (EGD) WITH PROPOFOL N/A 10/21/2015   Procedure: ESOPHAGOGASTRODUODENOSCOPY (EGD) WITH PROPOFOL;  Surgeon: Sherrilyn Rist, MD;  Location: [facility] ENDOSCOPY;  Service: Gastroenterology;  Laterality: N/A;  . GASTRECTOMY  In her 40s versus 50s.   for ulcer.  No details as to the extent of surgery.  Marland Kitchen HERNIA REPAIR    . HIP FRACTURE SURGERY    . JOINT REPLACEMENT     b/l hips     Inpatient Medications: Scheduled Meds:  Continuous Infusions:  PRN Meds:   Home Meds: Prior to Admission medications   Medication Sig Start Date End Date Taking? Authorizing Provider  acetaminophen (TYLENOL) 325 MG tablet Take 325 mg by mouth every 6 (six) hours as needed for moderate pain.   Yes [provider]  albuterol (PROVENTIL) (2.5 MG/3ML) 0.083% nebulizer solution Take 3 mLs (2.5 mg total) by nebulization every 6 (six) hours as needed for wheezing or shortness of breath. 01/29/15  Yes Marinda Elk, MD  aspirin EC 81 MG tablet Take 1 tablet (81 mg total) by mouth daily. 02/12/15  Yes Marinda Elk, MD  benazepril (LOTENSIN) 10 MG tablet Take 5 mg by mouth every morning.  02/28/14  Yes [provider]  calcium carbonate (OS-CAL - DOSED IN MG OF ELEMENTAL CALCIUM) 1250 MG tablet Take 1 tablet (500 mg of elemental calcium total) by mouth  2 (two) times daily with a meal. 08/14/14  Yes Marrian Salvage, MD  escitalopram (LEXAPRO) 10 MG tablet Take 10 mg by mouth every morning.   Yes [provider]  ferrous sulfate 325 (65 FE) MG tablet Take 325 mg by mouth daily with breakfast.   Yes [provider]  ipratropium-albuterol (DUONEB) 0.5-2.5 (3) MG/3ML SOLN Take 3 mLs by nebulization 2 (two) times daily.   Yes [provider]  levothyroxine (SYNTHROID, LEVOTHROID) 25 MCG tablet Take 25 mcg by mouth daily before breakfast.   Yes [provider]  ondansetron (ZOFRAN) 4 MG tablet 1 TABLET BY MOUTH3 TIMES A DAY AS NEEDED FOR NAUSEA 10/06/15  Yes [provider]  pantoprazole (PROTONIX) 40 MG tablet Take 40 mg by mouth daily.   Yes [provider]  Propylene Glycol (SYSTANE BALANCE) 0.6 % SOLN Apply 1 drop to eye daily as needed (dry  eyes).   Yes [provider]  protective barrier (RESTORE) CREA Apply 1 application topically every other day. To backside after showers.   Yes [provider]    Allergies:   No Known Allergies  Social History:   Social History   Socioeconomic History  . Marital status: Widowed    Spouse name: Not on file  . Number of children: Not on file  . Years of education: Not on file  . Highest education level: Not on file  Occupational History  . Not on file  Social Needs  . Financial resource strain: Not on file  . Food insecurity:    Worry: Not on file    Inability: Not on file  . Transportation needs:    Medical: Not on file    Non-medical: Not on file  Tobacco Use  . Smoking status: Former Smoker    Types: Cigarettes    Last attempt to quit: 05/26/2014    Years since quitting: 4.6  . Smokeless tobacco: Former Engineer, water and Sexual Activity  . Alcohol use: No    Alcohol/week: 0.0 standard drinks  . Drug use: No  . Sexual activity: Not on file  Lifestyle  . Physical activity:    Days per week: Not on file    Minutes  per session: Not on file  . Stress: Not on file  Relationships  . Social connections:    Talks on phone: Not on file    Gets together: Not on file    Attends religious service: Not on file    Active member of club or organization: Not on file    Attends meetings of clubs or organizations: Not on file    Relationship status: Not on file  . Intimate partner violence:    Fear of current or ex partner: Not on file    Emotionally abused: Not on file    Physically abused: Not on file    Forced sexual activity: Not on file  Other Topics Concern  . Not on file  Social History Narrative   Lives alone in [place]   Smoking since age 28 3 packs will last 3-4 days, trying to cut back and quit   1 living daughter, 2 daughters deceased    3 living sons    Unable to do ADLs daughter [name] is helping with these at home 6 days per week from 7:30 a to 5:30/6 p    Family History:   The patient's family history includes Alcohol abuse in her father; COPD in her son; Cancer in an other family member; Diabetes in her son; Heart Problems in her daughter; Heart disease in her mother; Hypertension in her mother.  ROS:  Please see the history of present illness. No abd pain. All other ROS reviewed and negative.     Physical Exam/Data:   Vitals:   01/26/19 1345 01/26/19 1400 01/26/19 1415 01/26/19 1430  BP: (!) 124/94 137/89 133/68 122/74  Pulse: 60     Resp: (!) 23 (!) Temp:      TempSrc:      SpO2: 97%      No intake or output data in the 24 hours ending 01/26/19 1454 Last 3 Weights 10/28/2018 10/20/2015 10/20/2015  Weight (lbs) 85 lb 110 lb 3.7 oz 110 lb 4.8 oz  Weight (kg) 38.556 kg 50 kg 50.032 kg    There is no height or weight on file to calculate BMI.  Exam pending  No seen IP due to Covid !( testing incomplete.  EKG:  The EKG was personally reviewed and demonstrates EKG showed NSR with nonspecific STT changes  Relevant CV Studies: None  Laboratory  Data:  Chemistry Recent Labs  Lab 01/26/19 1248  NA 145  K 3.5  CL 109  CO2 26  GLUCOSE 101*  BUN 17  CREATININE 0.85  CALCIUM 10.8*  GFRNONAA >60  GFRAA >60  ANIONGAP 10    No results for input(s): PROT, ALBUMIN, AST, ALT, ALKPHOS, BILITOT in the last 168 hours. Hematology Recent Labs  Lab 01/26/19 1248  WBC 5.5  RBC 4.54  HGB 12.1  HCT 40.7  MCV 89.6  MCH 26.7  MCHC 29.7*  RDW 13.2  PLT 204   Cardiac Enzymes Recent Labs  Lab 01/26/19 1248  TROPONINI 0.06*   No results for input(s): TROPIPOC in the last 168 hours.  BNPNo results for input(s): BNP, PROBNP in the last 168 hours.  DDimer No results for input(s): DDIMER in the last 168 hours.  Radiology/Studies:  Dg Chest Portable 1 View  Result Date: 01/26/2019 CLINICAL DATA:  Chest pain. EXAM: PORTABLE CHEST 1 VIEW COMPARISON:  Radiographs of November 06, 2018. FINDINGS: The heart size and mediastinal contours are within normal limits. Atherosclerosis of thoracic aorta is noted. No pneumothorax or pleural effusion is noted. Both lungs are clear. The visualized skeletal structures are unremarkable. IMPRESSION: No active disease. Aortic Atherosclerosis (ICD10-I70.0). Electronically Signed   By: Lupita Raider M.D.   On: 01/26/2019 14:24    Assessment and Plan:   1. Reported tachycardia - not clear what this represents. When EMS arrived, HR was 100bpm and NSR. She has been NSR in 60s-70s on telemetry. Differential is broad - sinus tach, atrial fib, SVT, VT (although did not have any LOC). Atrial flutter less likely given HR 180s (usually caps around 150s max, unless it's 1:1 which in that case HR is usually 250+). She was relatively asymptomatic and it does not seem that she would have sought care otherwise for this issue. Will discuss plan with MD; may be reasonable to initial empiric trial of selective BB in lieu of ACE.  2. Elevated troponin - EKG with nonspecific changes, similar to 2015. It's not clear that this  represents an acute coronary syndrome. May represent demand ischemia from transient arrhythmia. Trending troponins is a reasonable place to start in addition to administration of aspirin. Given her frailty, recent readmissions with failure to thrive, advanced age and history of falling, invasive approach to her care is not ideal. Will review with Dr. Katrinka Blazing.  3. COPD with intermittent use of home O2 - per IM. CXR without acute disease.  4. Frailty/deconditioning - home PT in place.   For questions or updates, please contact [facility] Please consult [email or web address] for contact info under Cardiology/STEMI.    Signed, Laurann Montana, PA-C (Chart prepped remotely/virtually with discussion with patient over the phone, in effort to decrease number of exposures to patient and staff - MD to follow with additional in-person interview/exam) 01/26/2019 2:54 PM

## 2019-01-26 NOTE — ED Notes (Signed)
Internal medicine team at bedside

## 2019-01-27 DIAGNOSIS — I110 Hypertensive heart disease with heart failure: Secondary | ICD-10-CM

## 2019-01-27 DIAGNOSIS — Z7982 Long term (current) use of aspirin: Secondary | ICD-10-CM

## 2019-01-27 DIAGNOSIS — K219 Gastro-esophageal reflux disease without esophagitis: Secondary | ICD-10-CM

## 2019-01-27 DIAGNOSIS — J9611 Chronic respiratory failure with hypoxia: Secondary | ICD-10-CM

## 2019-01-27 DIAGNOSIS — R000 Tachycardia, unspecified: Secondary | ICD-10-CM | POA: Diagnosis not present

## 2019-01-27 DIAGNOSIS — R0789 Other chest pain: Secondary | ICD-10-CM

## 2019-01-27 DIAGNOSIS — Z8673 Personal history of transient ischemic attack (TIA), and cerebral infarction without residual deficits: Secondary | ICD-10-CM

## 2019-01-27 DIAGNOSIS — L918 Other hypertrophic disorders of the skin: Secondary | ICD-10-CM

## 2019-01-27 DIAGNOSIS — Z66 Do not resuscitate: Secondary | ICD-10-CM

## 2019-01-27 DIAGNOSIS — Z1159 Encounter for screening for other viral diseases: Secondary | ICD-10-CM | POA: Diagnosis not present

## 2019-01-27 DIAGNOSIS — Z79890 Hormone replacement therapy (postmenopausal): Secondary | ICD-10-CM

## 2019-01-27 DIAGNOSIS — J449 Chronic obstructive pulmonary disease, unspecified: Secondary | ICD-10-CM | POA: Diagnosis not present

## 2019-01-27 DIAGNOSIS — I5030 Unspecified diastolic (congestive) heart failure: Secondary | ICD-10-CM

## 2019-01-27 DIAGNOSIS — E039 Hypothyroidism, unspecified: Secondary | ICD-10-CM

## 2019-01-27 DIAGNOSIS — I10 Essential (primary) hypertension: Secondary | ICD-10-CM | POA: Diagnosis not present

## 2019-01-27 DIAGNOSIS — R7989 Other specified abnormal findings of blood chemistry: Secondary | ICD-10-CM

## 2019-01-27 DIAGNOSIS — Z79899 Other long term (current) drug therapy: Secondary | ICD-10-CM

## 2019-01-27 LAB — TROPONIN I: Troponin I: 0.06 ng/mL (ref <0.03)

## 2019-01-27 NOTE — Progress Notes (Signed)
Progress Note  Patient Name: MALLEY ESCOBAR Date of Encounter: 01/27/2019  Primary Cardiologist: New to [place],  Lives in [place] , [place]   Subjective   85 yo female, admitted with failure to thrive and frequent falls.  Incidentally found to have troponin of 0.06 Demented, Unable to give any significant hx No pain at present    Inpatient Medications    Scheduled Meds: . aspirin EC  81 mg Oral Daily  . aspirin  325 mg Oral Once  . benazepril  5 mg Oral q morning - 10a  . calcium carbonate  1 tablet Oral BID WC  . enoxaparin (LOVENOX) injection  40 mg Subcutaneous Q24H  . escitalopram  10 mg Oral q morning - 10a  . ferrous sulfate  325 mg Oral Q breakfast  . levothyroxine  25 mcg Oral QAC breakfast  . pantoprazole  40 mg Oral Daily   Continuous Infusions:  PRN Meds: acetaminophen, ondansetron (ZOFRAN) IV   Vital Signs    Vitals:   01/26/19 1622 01/26/19 2044 01/27/19 0000 01/27/19 0425  BP: (!) 135/97 (!) 120/58 119/75 137/60  Pulse: (!) 56 (!) 51  (!) 58  Resp: 16 16 12 14   Temp: (!) 97.5 F (36.4 C) 97.6 F (36.4 C)  98 F (36.7 C)  TempSrc: Oral Oral  Oral  SpO2: 93% 94%  98%  Weight:    39.5 kg    Intake/Output Summary (Last 24 hours) at 01/27/2019 0953 Last data filed at 01/27/2019 0654 Gross per 24 hour  Intake 240 ml  Output -  Net 240 ml   Last 3 Weights 01/27/2019 10/28/2018 10/20/2015  Weight (lbs) 87 lb 1.3 oz 85 lb 110 lb 3.7 oz  Weight (kg) 39.5 kg 38.556 kg 50 kg      Telemetry     sinus brady at 55 - Personally Reviewed  ECG     - Personally Reviewed  Physical Exam    GEN: extremely frail , elderly female,  Neck: No JVD Cardiac: RRR, .  Respiratory: Clear to auscultation bilaterally. GI: Soft, nontender, non-distended  MS: No edema; No deformity. Neuro:  Nonfocal ,  Extremely weak  Psych: difficult to assess   Labs    Chemistry Recent Labs  Lab 01/26/19 1248  NA 145  K 3.5  CL 109  CO2 26  GLUCOSE 101*  BUN 17   CREATININE 0.85  CALCIUM 10.8*  GFRNONAA >60  GFRAA >60  ANIONGAP 10     Hematology Recent Labs  Lab 01/26/19 1248  WBC 5.5  RBC 4.54  HGB 12.1  HCT 40.7  MCV 89.6  MCH 26.7  MCHC 29.7*  RDW 13.2  PLT 204    Cardiac Enzymes Recent Labs  Lab 01/26/19 1248 01/26/19 1800 01/26/19 2358  TROPONINI 0.06* 0.06* 0.06*   No results for input(s): TROPIPOC in the last 168 hours.   BNPNo results for input(s): BNP, PROBNP in the last 168 hours.   DDimer No results for input(s): DDIMER in the last 168 hours.   Radiology    Dg Chest Portable 1 View  Result Date: 01/26/2019 CLINICAL DATA:  Chest pain. EXAM: PORTABLE CHEST 1 VIEW COMPARISON:  Radiographs of November 06, 2018. FINDINGS: The heart size and mediastinal contours are within normal limits. Atherosclerosis of thoracic aorta is noted. No pneumothorax or pleural effusion is noted. Both lungs are clear. The visualized skeletal structures are unremarkable. IMPRESSION: No active disease. Aortic Atherosclerosis (ICD10-I70.0). Electronically Signed   By: Fayrene Fearing  Christen ButterGreen Jr M.D.   On: 01/26/2019 14:24    Cardiac Studies     Patient Profile     85 y.o. female  With generalized failure to thrive   Assessment & Plan    1.   Troponin elevation: Patient's troponin is 0.06.  The trend has been flat and there is no pattern that is consistent with acute coronary syndrome.  She is not had any chest pain.  ECG shows sinus bradycardia with nonspecific ST and T wave changes.  She is not a candidate for invasive or interventional cardiology procedures.  Fortunately, her troponin levels do not suggest an ACS.   She is extremely frail and debilitated.  She has generalized failure to thrive.  Anticipate that she will need to go back to a skilled nursing facility if she was living at home.  No further cardiology work-up indicated.   [facility] will sign off.   Medication Recommendations:  Medical management per IM team  Other  recommendations (labs, testing, etc):   Follow up as an outpatient:  With her primary MD   For questions or updates, please contact [facility] Please consult [email or web address] for contact info under        Signed, Kristeen MissPhilip Law Corsino, MD  01/27/2019, 9:53 AM

## 2019-01-27 NOTE — Discharge Instructions (Signed)
April Mccoy  You came to [place] for high heart rate and chest pain. We have determined that this was not caused by a heart attack. Here are our recommendations for you at discharge:  - Continue taking your home medications as prescribed  Thank you for choosing [facility]   Heart-Healthy Eating Plan Heart-healthy meal planning includes:  Eating less unhealthy fats.  Eating more healthy fats.  Making other changes in your diet. Talk with your doctor or a diet specialist (dietitian) to create an eating plan that is right for you. What is my plan? Your doctor may recommend an eating plan that includes:  Total fat: ______% or less of total calories a day.  Saturated fat: ______% or less of total calories a day.  Cholesterol: less than _________mg a day. What are tips for following this plan? Cooking Avoid frying your food. Try to bake, boil, grill, or broil it instead. You can also reduce fat by:  Removing the skin from poultry.  Removing all visible fats from meats.  Steaming vegetables in water or broth. Meal planning   At meals, divide your plate into four equal parts: ? Fill one-half of your plate with vegetables and green salads. ? Fill one-fourth of your plate with whole grains. ? Fill one-fourth of your plate with lean protein foods.  Eat 4-5 servings of vegetables per day. A serving of vegetables is: ? 1 cup of raw or cooked vegetables. ? 2 cups of raw leafy greens.  Eat 4-5 servings of fruit per day. A serving of fruit is: ? 1 medium whole fruit. ?  cup of dried fruit. ?  cup of fresh, frozen, or canned fruit. ?  cup of 100% fruit juice.  Eat more foods that have soluble fiber. These are apples, broccoli, carrots, beans, peas, and barley. Try to get 20-30 g of fiber per day.  Eat 4-5 servings of nuts, legumes, and seeds per week: ? 1 serving of dried beans or legumes equals  cup after being cooked. ? 1 serving of nuts is  cup. ? 1 serving of seeds equals  1 tablespoon. General information  Eat more home-cooked food. Eat less restaurant, buffet, and fast food.  Limit or avoid alcohol.  Limit foods that are high in starch and sugar.  Avoid fried foods.  Lose weight if you are overweight.  Keep track of how much salt (sodium) you eat. This is important if you have high blood pressure. Ask your doctor to tell you more about this.  Try to add vegetarian meals each week. Fats  Choose healthy fats. These include olive oil and canola oil, flaxseeds, walnuts, almonds, and seeds.  Eat more omega-3 fats. These include salmon, mackerel, sardines, tuna, flaxseed oil, and ground flaxseeds. Try to eat fish at least 2 times each week.  Check food labels. Avoid foods with trans fats or high amounts of saturated fat.  Limit saturated fats. ? These are often found in animal products, such as meats, butter, and cream. ? These are also found in plant foods, such as palm oil, palm kernel oil, and coconut oil.  Avoid foods with partially hydrogenated oils in them. These have trans fats. Examples are stick margarine, some tub margarines, cookies, crackers, and other baked goods. What foods can I eat? Fruits All fresh, canned (in natural juice), or frozen fruits. Vegetables Fresh or frozen vegetables (raw, steamed, roasted, or grilled). Green salads. Grains Most grains. Choose whole wheat and whole grains most of the time. Rice and  pasta, including brown rice and pastas made with whole wheat. Meats and other proteins Lean, well-trimmed beef, veal, pork, and lamb. Chicken and [place] without skin. All fish and shellfish. Wild duck, rabbit, pheasant, and venison. Egg whites or low-cholesterol egg substitutes. Dried beans, peas, lentils, and tofu. Seeds and most nuts. Dairy Low-fat or nonfat cheeses, including ricotta and mozzarella. Skim or 1% milk that is liquid, powdered, or evaporated. Buttermilk that is made with low-fat milk. Nonfat or low-fat  yogurt. Fats and oils Non-hydrogenated (trans-free) margarines. Vegetable oils, including soybean, sesame, sunflower, olive, peanut, safflower, corn, canola, and cottonseed. Salad dressings or mayonnaise made with a vegetable oil. Beverages Mineral water. Coffee and tea. Diet carbonated beverages. Sweets and desserts Sherbet, gelatin, and fruit ice. Small amounts of dark chocolate. Limit all sweets and desserts. Seasonings and condiments All seasonings and condiments. The items listed above may not be a complete list of foods and drinks you can eat. Contact a dietitian for more options. What foods should I avoid? Fruits Canned fruit in heavy syrup. Fruit in cream or butter sauce. Fried fruit. Limit coconut. Vegetables Vegetables cooked in cheese, cream, or butter sauce. Fried vegetables. Grains Breads that are made with saturated or trans fats, oils, or whole milk. Croissants. Sweet rolls. Donuts. High-fat crackers, such as cheese crackers. Meats and other proteins Fatty meats, such as hot dogs, ribs, sausage, bacon, rib-eye roast or steak. High-fat deli meats, such as salami and bologna. Caviar. Domestic duck and goose. Organ meats, such as liver. Dairy Cream, sour cream, cream cheese, and creamed cottage cheese. Whole-milk cheeses. Whole or 2% milk that is liquid, evaporated, or condensed. Whole buttermilk. Cream sauce or high-fat cheese sauce. Yogurt that is made from whole milk. Fats and oils Meat fat, or shortening. Cocoa butter, hydrogenated oils, palm oil, coconut oil, palm kernel oil. Solid fats and shortenings, including bacon fat, salt pork, lard, and butter. Nondairy cream substitutes. Salad dressings with cheese or sour cream. Beverages Regular sodas and juice drinks with added sugar. Sweets and desserts Frosting. Pudding. Cookies. Cakes. Pies. Milk chocolate or white chocolate. Buttered syrups. Full-fat ice cream or ice cream drinks. The items listed above may not be a  complete list of foods and drinks to avoid. Contact a dietitian for more information. Summary  Heart-healthy meal planning includes eating less unhealthy fats, eating more healthy fats, and making other changes in your diet.  Eat a balanced diet. This includes fruits and vegetables, low-fat or nonfat dairy, lean protein, nuts and legumes, whole grains, and heart-healthy oils and fats. This information is not intended to replace advice given to you by your health care provider. Make sure you discuss any questions you have with your health care provider. Document Released: 02/22/2012 Document Revised: 09/30/2017 Document Reviewed: 09/30/2017 [employer]vier Interactive Patient Education  2019 [employer]

## 2019-01-27 NOTE — Progress Notes (Signed)
   Subjective:  April Mccoy is a 85 y.o. F with PMH of HTN, COPD, GERD and CVA admit for chest pain on hospital day 0  April Mccoy was examined and evaluated at bedside this AM. She states she is doing well this morning. Denies chest pain, palpitations, and shortness of breath. Able to sleep all night. Denies any pain. No acute complaints this morning. States she gets dizzy at times, but has never passed out.   Objective:  Vital signs in last 24 hours: Vitals:   01/26/19 1622 01/26/19 2044 01/27/19 0000 01/27/19 0425  BP: (!) 135/97 (!) 120/58 119/75 137/60  Pulse: (!) 56 (!) 51  (!) 58  Resp: 16 16 12 14   Temp: (!) 97.5 F (36.4 C) 97.6 F (36.4 C)  98 F (36.7 C)  TempSrc: Oral Oral  Oral  SpO2: 93% 94%  98%  Weight:    39.5 kg   Physical Exam  Constitutional: No distress.  thin  HENT:  Mouth/Throat: Oropharynx is clear and moist.  Cardiovascular: Normal rate, regular rhythm, normal heart sounds and intact distal pulses.  No murmur heard. Pulmonary/Chest: Effort normal. She has no wheezes. She has no rales.  Distant breath sounds  Musculoskeletal: Normal range of motion.        General: No edema.  Neurological: She is alert.  Difficulty with hearing  Skin: Skin is warm and dry.   Assessment/Plan:  Principal Problem:   Chest pain   April Mccoy is a 85 yo F w/ PMH of HTN, COPD, HFpEF, GERD and CVA presenting w/ chest pain possibly 2/2 tachyarrhythmia. Telemetry did not capture any tachyarrhythmia events. AM EKG shows sinus bradycardia. She may be having tachy/brady syndrome but unclear during this admission. Troponin has been flat at 0.06x4 and ACS has been ruled out. Cardiology not planning any interventions or further testing. Discharge home today.  ?Chest pain 2/2 ACS vs GERD vs MSK Reported by family. Patient herself denies. Troponin 0.06 x3. Repeat EKG shows no significant change from admission. Cardiology signed off - Discharge today to ALF  Tachycardia 2/2  tachyarrhythmia vs erroneous reading Reported HR >180s per home health nurse yesterday. HR 58 this AM. No tachyarrhythmias captured on telemetry. No previous hx of arrythmia. She does have hx of HFpEF from TEE in 2016 which puts her at higher risk for A.fib - C/w telemetry  Chronic hypoxic respiratory failure 2/2 COPD On 2L O2 at home at night. Current sat 93 on RA - C/w monitor - Albuterol PRN - Keep O2 sat >88  Chronic Diastolic Heart Failure TEE in 2706 show EF of 55-60 w/ grade 3 diastolic dysfunction. On benazepril 5mg . Not on diuretics, beta blockers due to soft pressures and low HR - C/w home med: benazepril 5mg  daily, aspirin 81mg  daily  Hypothyroidism Currently on levothyroxine . TSH 0.685 - C/w home med: levothyroxine  DVT prophx: Lovenox Diet: Soft Code: DNR  Dispo: Anticipated discharge in approximately 0 day(s).   April Barrio, MD 01/27/2019, 10:27 AM Pager: [phone]

## 2019-01-27 NOTE — Discharge Summary (Signed)
Name: April Mccoy MRN: [id number] DOB: 27-Jul-1929 85 y.o. PCP: Ailene Ravel, MD  Date of Admission: 01/26/2019 12:27 PM Date of Discharge: 01/27/2019 Attending Physician: Debe Coder MD  Discharge Diagnosis: 1. Tachycardia  Discharge Medications: Allergies as of 01/27/2019   No Known Allergies     Medication List    TAKE these medications   acetaminophen 325 MG tablet Commonly known as:  TYLENOL Take 325 mg by mouth every 6 (six) hours as needed for moderate pain.   albuterol (2.5 MG/3ML) 0.083% nebulizer solution Commonly known as:  PROVENTIL Take 3 mLs (2.5 mg total) by nebulization every 6 (six) hours as needed for wheezing or shortness of breath.   aspirin EC 81 MG tablet Take 1 tablet (81 mg total) by mouth daily.   benazepril 10 MG tablet Commonly known as:  LOTENSIN Take 5 mg by mouth every morning.   calcium carbonate 1250 (500 Ca) MG tablet Commonly known as:  OS-CAL - dosed in mg of elemental calcium Take 1 tablet (500 mg of elemental calcium total) by mouth 2 (two) times daily with a meal.   escitalopram 10 MG tablet Commonly known as:  LEXAPRO Take 10 mg by mouth every morning.   ferrous sulfate 325 (65 FE) MG tablet Take 325 mg by mouth daily with breakfast.   ipratropium-albuterol 0.5-2.5 (3) MG/3ML Soln Commonly known as:  DUONEB Take 3 mLs by nebulization 2 (two) times daily.   levothyroxine 25 MCG tablet Commonly known as:  SYNTHROID Take 25 mcg by mouth daily before breakfast.   ondansetron 4 MG tablet Commonly known as:  ZOFRAN Take 4 mg by mouth every 8 (eight) hours as needed for nausea or vomiting.   pantoprazole 40 MG tablet Commonly known as:  PROTONIX Take 40 mg by mouth daily.   protective barrier Crea Apply 1 application topically every other day. To backside after showers.   Systane Balance 0.6 % Soln Generic drug:  Propylene Glycol Apply 1 drop to eye daily as needed (dry eyes).       Disposition and  follow-up:   April Mccoy was discharged from [facility] in Good condition.  At the hospital follow up visit please address:  1. Tachycardia - No further follow up required. Possibly tachy-brady but asymptomatic and not candidate for pacemaker implantation  2.  Labs / imaging needed at time of follow-up: N/A  3.  Pending labs/ test needing follow-up: N/A  Follow-up Appointments: Follow-up Information    Hamrick, Durward Fortes, MD. Call.   Specialty:  Family Medicine Contact information: [address] [place] [address] [phone]          Hospital Course by problem list: 1. Tachycardia: April Mccoy is a 85 yo F w/ PMH of HTN, COPD, GERD, CVA and presbycusis presenting with tachycardia (180s per HHN). Presenting with HR of 70s in the ER. Family also reported recent episode of chest pressure but patient denies any prior episodes of chest pain. EKG without obvious ischemic changes. Troponin elevated at 0.06. Admitted for troponin trend. Troponins 0.06 x3. Telemetry had some PVCs but no significant tachyarrythmia. Repeat EKG noted to have sinus brady cardia with HR of 58. Suspected tach-brady syndrome but cardiology recommended not a suitable candidate for pacemaker implantation due to frailty. Discharged with recommendation to follow up with PCP.  Discharge Vitals:   BP 137/60 (BP Location: Right Arm)   Pulse (!) 58   Temp 98 F (36.7 C) (Oral)   Resp  14   Wt 39.5 kg   SpO2 98%   BMI 17.59 kg/m   Pertinent Labs, Studies, and Procedures:  Troponin I 0.06 -> 0.06 -> 0.06  Discharge Instructions: April Mccoy  You came to us for high heart rate and chest pain. We have determined that this was not caused by a heart attack. Here are our recommendations for you at discharge:  - Continue taking your home medications as prescribed  Thank you for choosing [facility]  Discharge Instructions    Call MD for:  difficulty breathing, headache or visual  disturbances   Complete by:  As directed    Call MD for:  persistant dizziness or light-headedness   Complete by:  As directed    Call MD for:  persistant nausea and vomiting   Complete by:  As directed    Call MD for:  redness, tenderness, or signs of infection (pain, swelling, redness, odor or green/yellow discharge around incision site)   Complete by:  As directed    Call MD for:  severe uncontrolled pain   Complete by:  As directed    Diet - low sodium heart healthy   Complete by:  As directed    Increase activity slowly   Complete by:  As directed      Signed: Theotis BarrioLee, Kayti Poss K, MD 01/29/2019, 10:41 AM   Pager: [phone]

## 2019-01-27 NOTE — Progress Notes (Signed)
  Date: 01/27/2019  Patient name: April Mccoy  Medical record number: [id number]  Date of birth: 1929/03/28   I have seen and evaluated April Mccoy and discussed their care with the Residency Team. Briefly, April Mccoy is an 85 year old woman who presented with tachycardia and chest pain.  She does not remember much about this, but it was noted that her HR was 180 at her ALF, 100s with EMS and then in the 50s - 60s here.  She had also reported chest pain, but is no longer having this symptom.  Troponin checked has been flat.  Cardiology has seen today and does not recommend any further cardiac work up at this time.    Vitals:   01/27/19 0000 01/27/19 0425  BP: 119/75 137/60  Pulse:  (!) 58  Resp: 12 14  Temp:  98 F (36.7 C)  SpO2:  98%   General: Frail, elderly woman, no pain or distress HENT: Temporal wasting, neck is supple CV: Bradycardic (mild), NR, no murmur noted, no peripheral edema Pulm: CTAB, no wheezing or rales Abd: Soft, NT Skin: Multiple skin tags  Assessment and Plan: I have seen and evaluated the patient as outlined above. I agree with the formulated Assessment and Plan as detailed in the residents' note, with the following changes:   1. Tachycardia, ACS - Tachycardia has resolved, TnI is flat, Cardiology not recommending further work up - Can be discharged to ALF today, no further work up.  - If tachycardia recurs, can consider a holter monitor to evaluate rhythm  Other issues per Dr. Marigene Ehlers note.   Inez Catalina, MD 5/23/202011:14 AM

## 2019-01-27 NOTE — Progress Notes (Signed)
Discharge order received.  IV and Telemetry removed by NT Erica.  Discharge instructions reviewed with daughter Delford Field, via phone, paperwork sent home with patient.  Patient taken to sons car via wheelchair.  Patient in no active distress.

## 2020-07-07 DIAGNOSIS — I471 Supraventricular tachycardia, unspecified: Secondary | ICD-10-CM

## 2020-07-07 HISTORY — DX: Supraventricular tachycardia, unspecified: I47.10

## 2020-07-07 HISTORY — DX: Supraventricular tachycardia: I47.1

## 2020-07-10 ENCOUNTER — Emergency Department (HOSPITAL_COMMUNITY): Payer: Medicare Other

## 2020-07-10 ENCOUNTER — Inpatient Hospital Stay (HOSPITAL_COMMUNITY)
Admission: EM | Admit: 2020-07-10 | Discharge: 2020-07-14 | DRG: 309 | Disposition: A | Discharge status: Home Health | Payer: Medicare Other | Attending: Internal Medicine | Admitting: Internal Medicine

## 2020-07-10 DIAGNOSIS — I959 Hypotension, unspecified: Secondary | ICD-10-CM | POA: Diagnosis present

## 2020-07-10 DIAGNOSIS — J449 Chronic obstructive pulmonary disease, unspecified: Secondary | ICD-10-CM | POA: Diagnosis present

## 2020-07-10 DIAGNOSIS — E039 Hypothyroidism, unspecified: Secondary | ICD-10-CM | POA: Diagnosis present

## 2020-07-10 DIAGNOSIS — I10 Essential (primary) hypertension: Secondary | ICD-10-CM | POA: Diagnosis present

## 2020-07-10 DIAGNOSIS — Z66 Do not resuscitate: Secondary | ICD-10-CM | POA: Diagnosis present

## 2020-07-10 DIAGNOSIS — I69391 Dysphagia following cerebral infarction: Secondary | ICD-10-CM

## 2020-07-10 DIAGNOSIS — R112 Nausea with vomiting, unspecified: Secondary | ICD-10-CM | POA: Diagnosis not present

## 2020-07-10 DIAGNOSIS — Z87891 Personal history of nicotine dependence: Secondary | ICD-10-CM

## 2020-07-10 DIAGNOSIS — D509 Iron deficiency anemia, unspecified: Secondary | ICD-10-CM | POA: Diagnosis present

## 2020-07-10 DIAGNOSIS — Z8249 Family history of ischemic heart disease and other diseases of the circulatory system: Secondary | ICD-10-CM

## 2020-07-10 DIAGNOSIS — H9190 Unspecified hearing loss, unspecified ear: Secondary | ICD-10-CM | POA: Diagnosis present

## 2020-07-10 DIAGNOSIS — K219 Gastro-esophageal reflux disease without esophagitis: Secondary | ICD-10-CM | POA: Diagnosis present

## 2020-07-10 DIAGNOSIS — Z79890 Hormone replacement therapy (postmenopausal): Secondary | ICD-10-CM

## 2020-07-10 DIAGNOSIS — Z8711 Personal history of peptic ulcer disease: Secondary | ICD-10-CM

## 2020-07-10 DIAGNOSIS — Z7982 Long term (current) use of aspirin: Secondary | ICD-10-CM

## 2020-07-10 DIAGNOSIS — K2100 Gastro-esophageal reflux disease with esophagitis, without bleeding: Secondary | ICD-10-CM | POA: Diagnosis present

## 2020-07-10 DIAGNOSIS — R778 Other specified abnormalities of plasma proteins: Secondary | ICD-10-CM | POA: Diagnosis present

## 2020-07-10 DIAGNOSIS — Z79899 Other long term (current) drug therapy: Secondary | ICD-10-CM

## 2020-07-10 DIAGNOSIS — I083 Combined rheumatic disorders of mitral, aortic and tricuspid valves: Secondary | ICD-10-CM | POA: Diagnosis present

## 2020-07-10 DIAGNOSIS — Z90710 Acquired absence of both cervix and uterus: Secondary | ICD-10-CM

## 2020-07-10 DIAGNOSIS — Z87440 Personal history of urinary (tract) infections: Secondary | ICD-10-CM

## 2020-07-10 DIAGNOSIS — I471 Supraventricular tachycardia, unspecified: Secondary | ICD-10-CM | POA: Diagnosis present

## 2020-07-10 DIAGNOSIS — R195 Other fecal abnormalities: Secondary | ICD-10-CM | POA: Diagnosis present

## 2020-07-10 DIAGNOSIS — F32A Depression, unspecified: Secondary | ICD-10-CM | POA: Diagnosis present

## 2020-07-10 DIAGNOSIS — E860 Dehydration: Secondary | ICD-10-CM | POA: Diagnosis present

## 2020-07-10 DIAGNOSIS — R7989 Other specified abnormal findings of blood chemistry: Secondary | ICD-10-CM | POA: Diagnosis present

## 2020-07-10 DIAGNOSIS — I248 Other forms of acute ischemic heart disease: Secondary | ICD-10-CM | POA: Diagnosis present

## 2020-07-10 DIAGNOSIS — K2970 Gastritis, unspecified, without bleeding: Secondary | ICD-10-CM | POA: Diagnosis present

## 2020-07-10 DIAGNOSIS — M19012 Primary osteoarthritis, left shoulder: Secondary | ICD-10-CM | POA: Diagnosis present

## 2020-07-10 DIAGNOSIS — Z20822 Contact with and (suspected) exposure to covid-19: Secondary | ICD-10-CM | POA: Diagnosis present

## 2020-07-10 DIAGNOSIS — Z825 Family history of asthma and other chronic lower respiratory diseases: Secondary | ICD-10-CM

## 2020-07-10 DIAGNOSIS — Z993 Dependence on wheelchair: Secondary | ICD-10-CM

## 2020-07-10 DIAGNOSIS — K922 Gastrointestinal hemorrhage, unspecified: Secondary | ICD-10-CM | POA: Diagnosis present

## 2020-07-10 LAB — COMPREHENSIVE METABOLIC PANEL
ALT: 15 U/L (ref 0–44)
AST: 21 U/L (ref 15–41)
Albumin: 3.2 g/dL — ABNORMAL LOW (ref 3.5–5.0)
Alkaline Phosphatase: 80 U/L (ref 38–126)
Anion gap: 7 (ref 5–15)
BUN: 20 mg/dL (ref 8–23)
CO2: 26 mmol/L (ref 22–32)
Calcium: 10.2 mg/dL (ref 8.9–10.3)
Chloride: 108 mmol/L (ref 98–111)
Creatinine, Ser: 0.92 mg/dL (ref 0.44–1.00)
GFR, Estimated: 60 mL/min — ABNORMAL LOW (ref >60)
Glucose, Bld: 136 mg/dL — ABNORMAL HIGH (ref 70–99)
Potassium: 3.9 mmol/L (ref 3.5–5.1)
Sodium: 141 mmol/L (ref 135–145)
Total Bilirubin: 0.7 mg/dL (ref 0.3–1.2)
Total Protein: 5.7 g/dL — ABNORMAL LOW (ref 6.5–8.1)

## 2020-07-10 LAB — TROPONIN I (HIGH SENSITIVITY)
Troponin I (High Sensitivity): 43 ng/L — ABNORMAL HIGH (ref <18)
Troponin I (High Sensitivity): 238 ng/L (ref <18)
Troponin I (High Sensitivity): 576 ng/L (ref <18)

## 2020-07-10 LAB — CBC WITH DIFFERENTIAL/PLATELET
Abs Immature Granulocytes: 0.02 10*3/uL (ref 0.00–0.07)
Basophils Absolute: 0.1 10*3/uL (ref 0.0–0.1)
Basophils Relative: 2 %
Eosinophils Absolute: 0.1 10*3/uL (ref 0.0–0.5)
Eosinophils Relative: 2 %
HCT: 40 % (ref 36.0–46.0)
Hemoglobin: 12 g/dL (ref 12.0–15.0)
Immature Granulocytes: 0 %
Lymphocytes Relative: 22 %
Lymphs Abs: 1.3 10*3/uL (ref 0.7–4.0)
MCH: 28.1 pg (ref 26.0–34.0)
MCHC: 30 g/dL (ref 30.0–36.0)
MCV: 93.7 fL (ref 80.0–100.0)
Monocytes Absolute: 0.5 10*3/uL (ref 0.1–1.0)
Monocytes Relative: 8 %
Neutro Abs: 3.9 10*3/uL (ref 1.7–7.7)
Neutrophils Relative %: 66 %
Platelets: 200 10*3/uL (ref 150–400)
RBC: 4.27 MIL/uL (ref 3.87–5.11)
RDW: 13 % (ref 11.5–15.5)
WBC: 5.9 10*3/uL (ref 4.0–10.5)
nRBC: 0 % (ref 0.0–0.2)

## 2020-07-10 LAB — LIPASE, BLOOD: Lipase: 35 U/L (ref 11–51)

## 2020-07-10 LAB — RESPIRATORY PANEL BY RT PCR (FLU A&B, COVID)
Influenza A by PCR: NEGATIVE
Influenza B by PCR: NEGATIVE
SARS Coronavirus 2 by RT PCR: NEGATIVE

## 2020-07-10 LAB — MAGNESIUM: Magnesium: 2.1 mg/dL (ref 1.7–2.4)

## 2020-07-10 MED ORDER — ZINC OXIDE 40 % EX OINT
1.0000 "application " | TOPICAL_OINTMENT | CUTANEOUS | Status: DC
  Administered 2020-07-13: 1 via TOPICAL
  Filled 2020-07-10: qty 57

## 2020-07-10 MED ORDER — ACETAMINOPHEN 325 MG PO TABS: 650.0000 mg | ORAL_TABLET | Freq: Four times a day (QID) | ORAL | Status: DC | PRN

## 2020-07-10 MED ORDER — SODIUM CHLORIDE 0.9 % IV BOLUS
1000.0000 mL | Freq: Once | INTRAVENOUS | Status: AC
  Administered 2020-07-10: 1000 mL via INTRAVENOUS

## 2020-07-10 MED ORDER — IPRATROPIUM-ALBUTEROL 0.5-2.5 (3) MG/3ML IN SOLN: 3.0000 mL | Freq: Two times a day (BID) | RESPIRATORY_TRACT | Status: DC | PRN

## 2020-07-10 MED ORDER — ACETAMINOPHEN 650 MG RE SUPP: 650.0000 mg | Freq: Four times a day (QID) | RECTAL | Status: DC | PRN

## 2020-07-10 MED ORDER — BENAZEPRIL HCL 5 MG PO TABS
10.0000 mg | ORAL_TABLET | Freq: Every day | ORAL | Status: DC
  Administered 2020-07-12 – 2020-07-14 (×3): 10 mg via ORAL
  Filled 2020-07-10 (×4): qty 2

## 2020-07-10 MED ORDER — IOHEXOL 300 MG/ML  SOLN
75.0000 mL | Freq: Once | INTRAMUSCULAR | Status: AC | PRN
  Administered 2020-07-10: 75 mL via INTRAVENOUS

## 2020-07-10 MED ORDER — SODIUM CHLORIDE 0.9% FLUSH
3.0000 mL | Freq: Two times a day (BID) | INTRAVENOUS | Status: DC
  Administered 2020-07-11 – 2020-07-14 (×6): 3 mL via INTRAVENOUS

## 2020-07-10 MED ORDER — ASPIRIN EC 81 MG PO TBEC
81.0000 mg | DELAYED_RELEASE_TABLET | Freq: Every day | ORAL | Status: DC
  Administered 2020-07-12 – 2020-07-14 (×3): 81 mg via ORAL
  Filled 2020-07-10 (×3): qty 1

## 2020-07-10 MED ORDER — LEVOTHYROXINE SODIUM 50 MCG PO TABS
50.0000 ug | ORAL_TABLET | Freq: Every day | ORAL | Status: DC
  Administered 2020-07-12 – 2020-07-14 (×3): 50 ug via ORAL
  Filled 2020-07-10 (×3): qty 1

## 2020-07-10 MED ORDER — HEPARIN SODIUM (PORCINE) 5000 UNIT/ML IJ SOLN
5000.0000 [IU] | Freq: Three times a day (TID) | INTRAMUSCULAR | Status: DC
  Administered 2020-07-11 – 2020-07-14 (×11): 5000 [IU] via SUBCUTANEOUS
  Filled 2020-07-10 (×11): qty 1

## 2020-07-10 MED ORDER — ESCITALOPRAM OXALATE 10 MG PO TABS
10.0000 mg | ORAL_TABLET | Freq: Every day | ORAL | Status: DC
  Administered 2020-07-12 – 2020-07-14 (×3): 10 mg via ORAL
  Filled 2020-07-10 (×3): qty 1

## 2020-07-10 MED ORDER — PANTOPRAZOLE SODIUM 40 MG PO TBEC
40.0000 mg | DELAYED_RELEASE_TABLET | Freq: Every day | ORAL | Status: DC
  Administered 2020-07-12: 40 mg via ORAL
  Filled 2020-07-10: qty 1

## 2020-07-10 MED ORDER — METOPROLOL SUCCINATE ER 25 MG PO TB24: 12.5000 mg | ORAL_TABLET | Freq: Every day | ORAL | Status: DC

## 2020-07-10 MED ORDER — CALCIUM CARBONATE 1250 (500 CA) MG PO TABS
1.0000 | ORAL_TABLET | Freq: Two times a day (BID) | ORAL | Status: DC
  Administered 2020-07-11 – 2020-07-14 (×6): 500 mg via ORAL
  Filled 2020-07-10 (×7): qty 1

## 2020-07-10 MED ORDER — ADENOSINE 6 MG/2ML IV SOLN
6.0000 mg | Freq: Once | INTRAVENOUS | Status: AC
  Administered 2020-07-10: 6 mg via INTRAVENOUS
  Filled 2020-07-10: qty 2

## 2020-07-10 MED ORDER — FERROUS SULFATE 325 (65 FE) MG PO TABS
325.0000 mg | ORAL_TABLET | Freq: Every day | ORAL | Status: DC
  Administered 2020-07-12 – 2020-07-14 (×3): 325 mg via ORAL
  Filled 2020-07-10 (×3): qty 1

## 2020-07-10 NOTE — ED Provider Notes (Signed)
Pt signed out by Dr. Criss Alvine pending CT abd/pelvis and Troponin.    CT:  IMPRESSION: Mild left basilar atelectasis.  Bilateral renal cysts.  Chronic compression deformities in the lumbar spine and thoracic spine  Aortic Atherosclerosis (ICD10-I70.0).   2nd troponin bumped to 238.  Pt d/w Dr. Excell Seltzer (cards).  He said no intervention.  Medical management.  He recommends adding metoprolol succinate 12.5 mg daily.  Pt d/w Dr. Alinda Money ([facility]) who will admit for obs.   Jacalyn Lefevre, MD 07/10/20 [id number]

## 2020-07-10 NOTE — ED Notes (Signed)
Pt placed on ZOLL, MD at bedside for adenosine administration. Pt awake and aware. Tolerated procedure well.

## 2020-07-10 NOTE — ED Notes (Signed)
Date and time results received: 07/10/20 now Test:Troponin Critical Value: 238 Name of Provider Notified: [place] Orders Received? Or Actions Taken?:None

## 2020-07-10 NOTE — ED Triage Notes (Signed)
Pt c/o nausea and vomiting this am. When MEDIC arrived pt with HR 180. Pt arrived in SVT with HR 158. Denies any other complaints at this time.

## 2020-07-10 NOTE — H&P (Signed)
History and Physical   April Mccoy JXB:[id number] DOB: 08/05/1929 DOA: 07/10/2020  PCP: Ailene RavelHamrick, Maura L, MD   Patient coming from: Home  Chief Complaint: Vomiting, tachycardia  HPI: April Mccoy is a 86 y.o. female with medical history significant of anemia, COPD, depression, GERD/history of duodenal ulcer, hypertension, hypothyroidism who presents with recent nausea and vomiting and was found to be in SVT by EMS.  History obtained with assistance of chart review and patient's daughter as she is a difficult historian.  She has had some episodes of nausea vomiting this morning that she felt better after vomiting. EMS was called and her heart rate is found to be in the 180s and apparent SVT.  She received 1 dose of adenosine in the ED which converted her rhythm back to sinus.  She denies chest pain, shortness of breath, abdominal pain, constipation or diarrhea when seen.  ED Course: Vitals in the ED significant for SVT initially which resolved with adenosine as above, otherwise vital signs stable.  Lab work showed normal BMP, normal magnesium, normal CBC.  LFTs showed mildly low protein and albumin.  Troponin up trended from 43-2 38.  Respiratory panel was negative.  UA and lipase pending.  Patient was seen by cardiology in the ED who recommended starting a beta-blocker and observing overnight.  Chest x-ray was without acute abnormality and CT abdomen showed chronic changes without acute abnormality.  Review of Systems: As per HPI otherwise all other systems reviewed and are negative.   Past Medical History:  Diagnosis Date   Abnormality of gait    Anemia    Bowel obstruction (HCC)    COPD (chronic obstructive pulmonary disease) (HCC)    Depression    Fracture of coccyx (HCC)    history   GERD (gastroesophageal reflux disease)    Hard of hearing    Hypertension    Left shoulder pain    DJD   Shortness of breath dyspnea    Stroke [facility](HCC)    with h/o dysphagia post stroke    Thyroid disease    Tobacco abuse    UTI (lower urinary tract infection)    Enterobacter aerogenes   Wheelchair bound     Past Surgical History:  Procedure Laterality Date   ABDOMINAL HYSTERECTOMY     BALLOON DILATION N/A 01/28/2015   Procedure: BALLOON DILATION;  Surgeon: Beverley FiedlerJay M Pyrtle, MD;  Location: [facility] ENDOSCOPY;  Service: Endoscopy;  Laterality: N/A;   CATARACT EXTRACTION     COLONOSCOPY WITH PROPOFOL N/A 10/21/2015   Procedure: COLONOSCOPY WITH PROPOFOL;  Surgeon: Sherrilyn RistHenry L Danis III, MD;  Location: [facility] ENDOSCOPY;  Service: Gastroenterology;  Laterality: N/A;   ESOPHAGOGASTRODUODENOSCOPY N/A 01/28/2015   Procedure: ESOPHAGOGASTRODUODENOSCOPY (EGD);  Surgeon: Beverley FiedlerJay M Pyrtle, MD;  [facility];  Service: Endoscopy;  Laterality: N/A;   ESOPHAGOGASTRODUODENOSCOPY (EGD) WITH PROPOFOL N/A 10/21/2015   Procedure: ESOPHAGOGASTRODUODENOSCOPY (EGD) WITH PROPOFOL;  Surgeon: Sherrilyn RistHenry L Danis III, MD;  Location: [facility] ENDOSCOPY;  Service: Gastroenterology;  Laterality: N/A;   GASTRECTOMY  In her 40s versus 50s.   for ulcer.  No details as to the extent of surgery.   HERNIA REPAIR     HIP FRACTURE SURGERY     JOINT REPLACEMENT     b/l hips    Social History  reports that she quit smoking about 6 years ago. Her smoking use included cigarettes. She has quit using smokeless tobacco. She reports that she does not drink alcohol and does not use drugs.  No  Known Allergies  Family History  Problem Relation Age of Onset   Heart disease Mother        died age 33 MI   Hypertension Mother    Alcohol abuse Father    Cancer Other        sister, 1 brother (lung) 1 brother (colon), daughter x 2 deceased 1 died age 59 pancreatitic another died age 33 lung   Diabetes Son    COPD Son    Heart Problems Daughter   Reviewed on admission  Prior to Admission medications   Medication Sig Start Date End Date Taking? Authorizing Provider  acetaminophen (TYLENOL) 325 MG tablet Take 325 mg  by mouth every 6 (six) hours as needed for moderate pain.   Yes [provider]  albuterol (PROVENTIL) (2.5 MG/3ML) 0.083% nebulizer solution Take 3 mLs (2.5 mg total) by nebulization every 6 (six) hours as needed for wheezing or shortness of breath. 01/29/15  Yes Marinda Elk, MD  aspirin EC 81 MG tablet Take 1 tablet (81 mg total) by mouth daily. 02/12/15  Yes Marinda Elk, MD  benazepril (LOTENSIN) 10 MG tablet Take 10 mg by mouth daily.  02/28/14  Yes [provider]  calcium carbonate (OS-CAL - DOSED IN MG OF ELEMENTAL CALCIUM) 1250 MG tablet Take 1 tablet (500 mg of elemental calcium total) by mouth 2 (two) times daily with a meal. 08/14/14  Yes Marrian Salvage, MD  escitalopram (LEXAPRO) 10 MG tablet Take 10 mg by mouth daily.    Yes [provider]  ferrous sulfate 325 (65 FE) MG tablet Take 325 mg by mouth daily with breakfast.   Yes [provider]  ipratropium-albuterol (DUONEB) 0.5-2.5 (3) MG/3ML SOLN Take 3 mLs by nebulization 2 (two) times daily as needed (for breathing).    Yes [provider]  levothyroxine (SYNTHROID) 50 MCG tablet Take 50 mcg by mouth daily. 06/07/20  Yes [provider]  ondansetron (ZOFRAN) 4 MG tablet Take 4 mg by mouth every 8 (eight) hours as needed for nausea or vomiting.  10/06/15  Yes [provider]  pantoprazole (PROTONIX) 40 MG tablet Take 40 mg by mouth daily.   Yes [provider]  Propylene Glycol (SYSTANE BALANCE) 0.6 % SOLN Apply 1 drop to eye daily as needed (for dry eyes).    Yes [provider]  protective barrier (RESTORE) CREA Apply 1 application topically every other day.    Yes [provider]    Physical Exam: Vitals:   07/10/20 1845 07/10/20 1900 07/10/20 2000 07/10/20 2130  BP: 104/64 115/69 135/74 (!) 135/52  Pulse: (!) 49 (!) 56 61 (!) 54  Resp: 10 10 13 14   Temp:      TempSrc:      SpO2: 94% 94% 93% 100%  Height:       Physical  Exam Constitutional:      General: She is not in acute distress.    Appearance: Normal appearance.     Comments: Thin frail elderly female  HENT:     Head: Normocephalic and atraumatic.     Mouth/Throat:     Mouth: Mucous membranes are moist.     Pharynx: Oropharynx is clear.  Eyes:     Extraocular Movements: Extraocular movements intact.     Pupils: Pupils are equal, round, and reactive to light.  Cardiovascular:     Rate and Rhythm: Normal rate and regular rhythm.     Pulses: Normal pulses.  Heart sounds: Normal heart sounds.  Pulmonary:     Effort: Pulmonary effort is normal. No respiratory distress.     Breath sounds: Normal breath sounds.  Abdominal:     General: Bowel sounds are normal. There is no distension.     Palpations: Abdomen is soft.     Tenderness: There is no abdominal tenderness.  Musculoskeletal:        General: No swelling or deformity.  Skin:    General: Skin is warm and dry.  Neurological:     General: No focal deficit present.     Mental Status: Mental status is at baseline.    Labs on Admission: I have personally reviewed following labs and imaging studies  CBC: Recent Labs  Lab 07/10/20 1248  WBC 5.9  NEUTROABS 3.9  HGB 12.0  HCT 40.0  MCV 93.7  PLT 200    Basic Metabolic Panel: Recent Labs  Lab 07/10/20 1248  NA 141  K 3.9  CL 108  CO2 26  GLUCOSE 136*  BUN 20  CREATININE 0.92  CALCIUM 10.2  MG 2.1    GFR: CrCl cannot be calculated (Unknown ideal weight.).  Liver Function Tests: Recent Labs  Lab 07/10/20 1248  AST 21  ALT 15  ALKPHOS 80  BILITOT 0.7  PROT 5.7*  ALBUMIN 3.2*    Urine analysis:    Component Value Date/Time   COLORURINE YELLOW 01/26/2019 1740   APPEARANCEUR HAZY (A) 01/26/2019 1740   LABSPEC 1.015 01/26/2019 1740   PHURINE 6.0 01/26/2019 1740   GLUCOSEU NEGATIVE 01/26/2019 1740   HGBUR NEGATIVE 01/26/2019 1740   BILIRUBINUR NEGATIVE 01/26/2019 1740   KETONESUR NEGATIVE 01/26/2019 1740    PROTEINUR NEGATIVE 01/26/2019 1740   UROBILINOGEN 1.0 01/25/2015 1721   NITRITE NEGATIVE 01/26/2019 1740   LEUKOCYTESUR NEGATIVE 01/26/2019 1740    Radiological Exams on Admission: CT ABDOMEN PELVIS W CONTRAST  Result Date: 07/10/2020 CLINICAL DATA:  Nausea and vomiting EXAM: CT ABDOMEN AND PELVIS WITH CONTRAST TECHNIQUE: Multidetector CT imaging of the abdomen and pelvis was performed using the standard protocol following bolus administration of intravenous contrast. CONTRAST:  57mL OMNIPAQUE IOHEXOL 300 MG/ML  SOLN COMPARISON:  None. FINDINGS: Lower chest: Lung bases demonstrate some mild peribronchial thickening as well as mild left basilar atelectasis. Hepatobiliary: Fatty infiltration of the liver is noted. The gallbladder appears within normal limits. Pancreas: Unremarkable. No pancreatic ductal dilatation or surrounding inflammatory changes. Spleen: Normal in size without focal abnormality. Adrenals/Urinary Tract: Adrenal glands are within normal limits. Kidneys demonstrate a normal enhancement pattern bilaterally. Small lower pole right renal cyst and left upper pole renal cyst are noted. No calculi are seen. No obstructive changes are noted. The bladder is partially distended. Stomach/Bowel: No obstructive or inflammatory changes of the colon are noted. The appendix is not well visualized although no inflammatory changes are seen to suggest appendicitis. Small bowel and stomach appear within normal limits. Vascular/Lymphatic: Atherosclerotic calcifications of the abdominal aorta are noted. No aneurysmal dilatation is seen. No significant lymphadenopathy is noted. Reproductive: Status post hysterectomy. No adnexal masses. Other: No abdominal wall hernia or abnormality. No abdominopelvic ascites. Musculoskeletal: Postsurgical changes in the proximal femurs are noted bilaterally. Generalized osteopenia is seen. Compression deformities are noted at L2, L3 and L4 as well as T11 and T12. These are stable  from a prior plain film examination from February of 2020 IMPRESSION: Mild left basilar atelectasis. Bilateral renal cysts. Chronic compression deformities in the lumbar spine and thoracic spine Aortic Atherosclerosis (ICD10-I70.0). Electronically  Signed   By: Alcide Clever M.D.   On: 07/10/2020 17:17   DG Chest Portable 1 View  Result Date: 07/10/2020 CLINICAL DATA:  Supraventricular tachycardia. EXAM: PORTABLE CHEST 1 VIEW COMPARISON:  April 22, 2020. FINDINGS: Stable cardiomediastinal silhouette. No pneumothorax or pleural effusion is noted. Both lungs are clear. The visualized skeletal structures are unremarkable. IMPRESSION: No active disease. Aortic Atherosclerosis (ICD10-I70.0). Electronically Signed   By: Lupita Raider M.D.   On: 07/10/2020 13:26    EKG: Independently reviewed.  EKG initially showed SVT to 153.  Repeat EKG showed sinus rhythm at 88 bpm.  Assessment/Plan Active Problems:   SVT (supraventricular tachycardia) (HCC)  Demand ischemia Supraventricular tachycardia > Now sinus rhythm status post adenosine > Troponin elevated to 43, then 238.  Likely demand ischemia in the setting of SVT.  Patient is asymptomatic. - Cardiology consulted in ED - Metoprolol 12.5 mg daily - Trend Troponin - AM labs  COPD - Continue as needed duo nebs  GERD - Continue PPI  Hypertension - Continue home benazepril  - Metoprolol as above  Hypothyroidism - Continue home Synthroid  Depression - Continue home Lexapro   DVT prophylaxis: Heparin  Code Status:   DNR  Family Communication:  Daughter, April Mccoy, updated by phone at [phone].   Disposition Plan:   Patient is from:  Home  Anticipated DC to:  Pending work-up  Anticipated DC date:  Pending clinical course   Consults called:  Cardiology, Dr. Excell Seltzer, consulted in the ED  Admission status:  Observation, telemetry   Severity of Illness: The appropriate patient status for this patient is OBSERVATION. Observation status is  judged to be reasonable and necessary in order to provide the required intensity of service to ensure the patient's safety. The patient's presenting symptoms, physical exam findings, and initial radiographic and laboratory data in the context of their medical condition is felt to place them at decreased risk for further clinical deterioration. Furthermore, it is anticipated that the patient will be medically stable for discharge from the hospital within 2 midnights of admission. The following factors support the patient status of observation.   " The patient's presenting symptoms include illness, initially tachycardic with SVT. " The physical exam findings include thin frail elderly female. " The initial radiographic and laboratory data are without acute abnormality does show low protein and albumin.    Synetta Fail MD [facility]  How to contact the [facility] Attending or Consulting provider 7A - 7P or covering provider during after hours 7P -7A, for this patient?   1. Check the care team in [facility] and look for a) attending/consulting [facility] provider listed and b) the [facility] team listed 2. Log into [email or web address] and use [place]'s universal password to access. If you do not have the password, please contact the hospital operator. 3. Locate the [facility] provider you are looking for under [facility] and page to a number that you can be directly reached. 4. If you still have difficulty reaching the provider, please page the [facility] (Director on Call) for the Hospitalists listed on amion for assistance.  07/10/2020, 10:05 PM

## 2020-07-10 NOTE — ED Notes (Signed)
Pt to go to [facility]; report called to Greig Castilla, Charity fundraiser.

## 2020-07-10 NOTE — Consult Note (Addendum)
Cardiology Consultation:   Patient ID: April Mccoy MRN: [id number]; DOB: Jul 30, 1929  Admit date: 07/10/2020 Date of Consult: 07/10/2020  Primary Care Provider: Ailene Ravel, MD [facility] Cardiologist: No primary care provider on file.  [facility] Electrophysiologist:  None    Patient Profile:   April Mccoy is a 86 y.o. female with no past cardiac history who is being seen today for the evaluation of SVT at the request of Dr. Particia Nearing.  History of Present Illness:   April Mccoy is an elderly, frail woman who presented today with supraventricular tachycardia.  The patient is a difficult historian.  Her daughter was here earlier but she has left to go home.  Apparently the patient had some episodes of vomiting this morning.  EMS was called and when they arrived she was found to be in supraventricular tachycardia.  When she presented to the emergency department, IV access was obtained and she was administered adenosine when she converted back to normal sinus rhythm.  At the time of my evaluation this evening, the patient has no complaints.  She is resting comfortably.  She states that she came to the emergency room because her doctor told her to.  She denies chest pain, chest pressure, or shortness of breath.  She denies lightheadedness or syncope.  Past Medical History:  Diagnosis Date  . Abnormality of gait   . Anemia   . Bowel obstruction (HCC)   . COPD (chronic obstructive pulmonary disease) (HCC)   . Depression   . Fracture of coccyx (HCC)    history  . GERD (gastroesophageal reflux disease)   . Hard of hearing   . Hypertension   . Left shoulder pain    DJD  . Shortness of breath dyspnea   . Stroke [facility])    with h/o dysphagia post stroke  . Thyroid disease   . Tobacco abuse   . UTI (lower urinary tract infection)    Enterobacter aerogenes  . Wheelchair bound     Past Surgical History:  Procedure Laterality Date  . ABDOMINAL HYSTERECTOMY    . BALLOON  DILATION N/A 01/28/2015   Procedure: BALLOON DILATION;  Surgeon: Beverley Fiedler, MD;  Location: [facility] ENDOSCOPY;  Service: Endoscopy;  Laterality: N/A;  . CATARACT EXTRACTION    . COLONOSCOPY WITH PROPOFOL N/A 10/21/2015   Procedure: COLONOSCOPY WITH PROPOFOL;  Surgeon: Sherrilyn Rist, MD;  Location: [facility] ENDOSCOPY;  Service: Gastroenterology;  Laterality: N/A;  . ESOPHAGOGASTRODUODENOSCOPY N/A 01/28/2015   Procedure: ESOPHAGOGASTRODUODENOSCOPY (EGD);  Surgeon: Beverley Fiedler, MD;  Location: [facility] ENDOSCOPY;  Service: Endoscopy;  Laterality: N/A;  . ESOPHAGOGASTRODUODENOSCOPY (EGD) WITH PROPOFOL N/A 10/21/2015   Procedure: ESOPHAGOGASTRODUODENOSCOPY (EGD) WITH PROPOFOL;  Surgeon: Sherrilyn Rist, MD;  Location: [facility] ENDOSCOPY;  Service: Gastroenterology;  Laterality: N/A;  . GASTRECTOMY  In her 40s versus 50s.   for ulcer.  No details as to the extent of surgery.  Marland Kitchen HERNIA REPAIR    . HIP FRACTURE SURGERY    . JOINT REPLACEMENT     b/l hips     Home Medications:  Prior to Admission medications   Medication Sig Start Date End Date Taking? Authorizing Provider  acetaminophen (TYLENOL) 325 MG tablet Take 325 mg by mouth every 6 (six) hours as needed for moderate pain.   Yes [provider]  albuterol (PROVENTIL) (2.5 MG/3ML) 0.083% nebulizer solution Take 3 mLs (2.5 mg total) by nebulization every 6 (six) hours as needed for wheezing or shortness of  breath. 01/29/15  Yes Marinda Elk, MD  aspirin EC 81 MG tablet Take 1 tablet (81 mg total) by mouth daily. 02/12/15  Yes Marinda Elk, MD  benazepril (LOTENSIN) 10 MG tablet Take 10 mg by mouth daily.  02/28/14  Yes [provider]  calcium carbonate (OS-CAL - DOSED IN MG OF ELEMENTAL CALCIUM) 1250 MG tablet Take 1 tablet (500 mg of elemental calcium total) by mouth 2 (two) times daily with a meal. 08/14/14  Yes Marrian Salvage, MD  escitalopram (LEXAPRO) 10 MG tablet Take 10 mg by mouth daily.    Yes [provider]   ferrous sulfate 325 (65 FE) MG tablet Take 325 mg by mouth daily with breakfast.   Yes [provider]  ipratropium-albuterol (DUONEB) 0.5-2.5 (3) MG/3ML SOLN Take 3 mLs by nebulization 2 (two) times daily as needed (for breathing).    Yes [provider]  levothyroxine (SYNTHROID) 50 MCG tablet Take 50 mcg by mouth daily. 06/07/20  Yes [provider]  ondansetron (ZOFRAN) 4 MG tablet Take 4 mg by mouth every 8 (eight) hours as needed for nausea or vomiting.  10/06/15  Yes [provider]  pantoprazole (PROTONIX) 40 MG tablet Take 40 mg by mouth daily.   Yes [provider]  Propylene Glycol (SYSTANE BALANCE) 0.6 % SOLN Apply 1 drop to eye daily as needed (for dry eyes).    Yes [provider]  protective barrier (RESTORE) CREA Apply 1 application topically every other day.    Yes [provider]    Inpatient Medications: Scheduled Meds:  Continuous Infusions:  PRN Meds:   Allergies:   No Known Allergies  Social History:   Social History   Socioeconomic History  . Marital status: Widowed    Spouse name: Not on file  . Number of children: Not on file  . Years of education: Not on file  . Highest education level: Not on file  Occupational History  . Not on file  Tobacco Use  . Smoking status: Former Smoker    Types: Cigarettes    Quit date: 05/26/2014    Years since quitting: 6.1  . Smokeless tobacco: Former Engineer, water and Sexual Activity  . Alcohol use: No    Alcohol/week: 0.0 standard drinks  . Drug use: No  . Sexual activity: Not on file  Other Topics Concern  . Not on file  Social History Narrative   Lives alone in [place]   Smoking since age 61 3 packs will last 3-4 days, trying to cut back and quit   1 living daughter, 2 daughters deceased    3 living sons    Unable to do ADLs daughter [name] is helping with these at home 6 days per week from 7:30 a to 5:30/6 p   Social Determinants of Health    Financial Resource Strain:   . Difficulty of Paying Living Expenses: Not on file  Food Insecurity:   . Worried About Programme researcher, broadcasting/film/video in the Last Year: Not on file  . Ran Out of Food in the Last Year: Not on file  Transportation Needs:   . Lack of Transportation (Medical): Not on file  . Lack of Transportation (Non-Medical): Not on file  Physical Activity:   . Days of Exercise per Week: Not on file  . Minutes of Exercise per Session: Not on file  Stress:   . Feeling of Stress : Not on file  Social Connections:   .  Frequency of Communication with Friends and Family: Not on file  . Frequency of Social Gatherings with Friends and Family: Not on file  . Attends Religious Services: Not on file  . Active Member of Clubs or Organizations: Not on file  . Attends Banker Meetings: Not on file  . Marital Status: Not on file  Intimate Partner Violence:   . Fear of Current or Ex-Partner: Not on file  . Emotionally Abused: Not on file  . Physically Abused: Not on file  . Sexually Abused: Not on file    Family History:    Family History  Problem Relation Age of Onset  . Heart disease Mother        died age 35 MI  . Hypertension Mother   . Alcohol abuse Father   . Cancer Other        sister, 1 brother (lung) 1 brother (colon), daughter x 2 deceased 1 died age 47 pancreatitic another died age 8 lung  . Diabetes Son   . COPD Son   . Heart Problems Daughter      ROS:  Please see the history of present illness.  All other ROS reviewed and negative.     Physical Exam/Data:   Vitals:   07/10/20 1730 07/10/20 1745 07/10/20 1800 07/10/20 1815  BP: 126/77 125/76  134/73  Pulse: (!) 59 (!) 58 68 (!) 59  Resp: 12 20 (!) 25 17  Temp:      TempSrc:      SpO2: 94% 94% 97% 94%  Height:       No intake or output data in the 24 hours ending 07/10/20 1833 Last 3 Weights 01/27/2019 10/28/2018 10/20/2015  Weight (lbs) 87 lb 1.3 oz 85 lb 110 lb 3.7 oz  Weight (kg) 39.5 kg  38.556 kg 50 kg     Body mass index is 17.59 kg/m.  General: Elderly, cachectic woman, in no acute distress HEENT: normal Lymph: no adenopathy Neck: no JVD Endocrine:  No thryomegaly Vascular: Normal carotid upstrokes; FA pulses 2+ bilaterally Cardiac:  normal S1, S2; RRR; no murmur  Lungs:  clear to auscultation bilaterally, no wheezing, rhonchi or rales  Abd: soft, nontender, no hepatomegaly  Ext: no edema Musculoskeletal:  No deformities, BUE and BLE strength normal and equal Skin: warm and dry  Neuro:  CNs 2-12 intact, no focal abnormalities noted Psych:  Normal affect   EKG:  The EKG was personally reviewed and demonstrates: EKG from the field demonstrates narrow complex tachycardia/supraventricular tachycardia heart rate 180 bpm.  Marked ST depression at a heart rate of 180 bpm  Follow-up EKG: Normal sinus rhythm with minimal ST segment depression, nonspecific changes.  Telemetry:  Telemetry was personally reviewed and demonstrates: Normal sinus rhythm without arrhythmia  Laboratory Data:  High Sensitivity Troponin:   Recent Labs  Lab 07/10/20 1248 07/10/20 1522  TROPONINIHS 43* 238*     Chemistry Recent Labs  Lab 07/10/20 1248  NA 141  K 3.9  CL 108  CO2 26  GLUCOSE 136*  BUN 20  CREATININE 0.92  CALCIUM 10.2  GFRNONAA 60*  ANIONGAP 7    Recent Labs  Lab 07/10/20 1248  PROT 5.7*  ALBUMIN 3.2*  AST 21  ALT 15  ALKPHOS 80  BILITOT 0.7   Hematology Recent Labs  Lab 07/10/20 1248  WBC 5.9  RBC 4.27  HGB 12.0  HCT 40.0  MCV 93.7  MCH 28.1  MCHC 30.0  RDW 13.0  PLT 200  BNPNo results for input(s): BNP, PROBNP in the last 168 hours.  DDimer No results for input(s): DDIMER in the last 168 hours.   Radiology/Studies:  CT ABDOMEN PELVIS W CONTRAST  Result Date: 07/10/2020 CLINICAL DATA:  Nausea and vomiting EXAM: CT ABDOMEN AND PELVIS WITH CONTRAST TECHNIQUE: Multidetector CT imaging of the abdomen and pelvis was performed using the  standard protocol following bolus administration of intravenous contrast. CONTRAST:  75mL OMNIPAQUE IOHEXOL 300 MG/ML  SOLN COMPARISON:  None. FINDINGS: Lower chest: Lung bases demonstrate some mild peribronchial thickening as well as mild left basilar atelectasis. Hepatobiliary: Fatty infiltration of the liver is noted. The gallbladder appears within normal limits. Pancreas: Unremarkable. No pancreatic ductal dilatation or surrounding inflammatory changes. Spleen: Normal in size without focal abnormality. Adrenals/Urinary Tract: Adrenal glands are within normal limits. Kidneys demonstrate a normal enhancement pattern bilaterally. Small lower pole right renal cyst and left upper pole renal cyst are noted. No calculi are seen. No obstructive changes are noted. The bladder is partially distended. Stomach/Bowel: No obstructive or inflammatory changes of the colon are noted. The appendix is not well visualized although no inflammatory changes are seen to suggest appendicitis. Small bowel and stomach appear within normal limits. Vascular/Lymphatic: Atherosclerotic calcifications of the abdominal aorta are noted. No aneurysmal dilatation is seen. No significant lymphadenopathy is noted. Reproductive: Status post hysterectomy. No adnexal masses. Other: No abdominal wall hernia or abnormality. No abdominopelvic ascites. Musculoskeletal: Postsurgical changes in the proximal femurs are noted bilaterally. Generalized osteopenia is seen. Compression deformities are noted at L2, L3 and L4 as well as T11 and T12. These are stable from a prior plain film examination from February of 2020 IMPRESSION: Mild left basilar atelectasis. Bilateral renal cysts. Chronic compression deformities in the lumbar spine and thoracic spine Aortic Atherosclerosis (ICD10-I70.0). Electronically Signed   By: Alcide CleverMark  Lukens M.D.   On: 07/10/2020 17:17   DG Chest Portable 1 View  Result Date: 07/10/2020 CLINICAL DATA:  Supraventricular tachycardia.  EXAM: PORTABLE CHEST 1 VIEW COMPARISON:  April 22, 2020. FINDINGS: Stable cardiomediastinal silhouette. No pneumothorax or pleural effusion is noted. Both lungs are clear. The visualized skeletal structures are unremarkable. IMPRESSION: No active disease. Aortic Atherosclerosis (ICD10-I70.0). Electronically Signed   By: Lupita RaiderJames  Green Jr M.D.   On: 07/10/2020 13:26     Assessment and Plan:   1. Supraventricular tachycardia, chemically converted back to sinus rhythm with adenosine x1.  No previous history of this. 2. Mild troponin elevation consistent with demand ischemia in this elderly patient with supraventricular tachycardia.  No signs of acute coronary syndrome.  The patient is completely asymptomatic now.  The patient is currently here alone.  Difficult to obtain much history but she seems to be doing fine with no complaints at present.  I called her daughter and reviewed the findings of supraventricular tachycardia and mild troponin elevation.  I do not think the patient has any signs or symptoms of acute coronary syndrome.  I would recommend starting her on a low-dose of metoprolol succinate 12.5 mg daily.  The patient lives alone and is extremely frail.  She has no one who can stay with her tonight.  She will likely require overnight observation.  Will request medicine/hospitalist admission.       For questions or updates, please contact [facility] Please consult [email or web address] for contact info under   Signed, Tonny BollmanMichael Selwyn Reason, MD  07/10/2020 6:33 PM

## 2020-07-10 NOTE — ED Provider Notes (Signed)
[facility] EMERGENCY DEPARTMENT Provider Note   CSN: [id number] Arrival date & time: 07/10/20  1210     History Chief Complaint  Patient presents with  . Emesis    started today    April Mccoy is a 86 y.o. female.  HPI 86 year old female presents with vomiting.  History is primarily from the nurse who spoke to EMS.  No family is currently present.  Patient is not a good historian.  Apparently she is had numerous episodes of vomiting this morning though is feeling better after vomiting.  On EMS arrival her heart rate was 180 and appeared to be SVT.  No meds have been given no an IV was established just prior to arrival.  Currently she denies any acute complaints though when examining her abdomen she tells me that her abdomen always hurts and is currently hurting. LEVEL 5 CAVEAT.   Past Medical History:  Diagnosis Date  . Abnormality of gait   . Anemia   . Bowel obstruction (HCC)   . COPD (chronic obstructive pulmonary disease) (HCC)   . Depression   . Fracture of coccyx (HCC)    history  . GERD (gastroesophageal reflux disease)   . Hard of hearing   . Hypertension   . Left shoulder pain    DJD  . Shortness of breath dyspnea   . Stroke [facility])    with h/o dysphagia post stroke  . Thyroid disease   . Tobacco abuse   . UTI (lower urinary tract infection)    Enterobacter aerogenes  . Wheelchair bound     Patient Active Problem List   Diagnosis Date Noted  . Iron deficiency anemia 10/20/2015  . Panlobular emphysema (HCC)   . Esophageal ulcer 01/29/2015  . Duodenal ulcer 01/29/2015  . Esophageal dysmotility 01/29/2015  . Esophageal ulceration   . Hypoxia 01/25/2015  . Nausea and vomiting 01/25/2015  . Hypoxemia 01/25/2015  . Fracture of multiple ribs 08/14/2014  . Osteopenia 08/14/2014  . Chest pain 08/13/2014  . GERD (gastroesophageal reflux disease) 08/13/2014  . Anemia 08/13/2014  . Depression 08/13/2014  . History of stroke 08/13/2014   . Hypertension 08/13/2014  . Thyroid disease 08/13/2014  . COPD (chronic obstructive pulmonary disease) (HCC) 08/13/2014  . Dysphagia 08/13/2014  . Right flank pain 08/13/2014  . Mid back pain 08/13/2014  . Abnormality of gait 08/13/2014  . Tobacco abuse 08/13/2014    Past Surgical History:  Procedure Laterality Date  . ABDOMINAL HYSTERECTOMY    . BALLOON DILATION N/A 01/28/2015   Procedure: BALLOON DILATION;  Surgeon: Beverley Fiedler, MD;  [facility] ENDOSCOPY;  Service: Endoscopy;  Laterality: N/A;  . CATARACT EXTRACTION    . COLONOSCOPY WITH PROPOFOL N/A 10/21/2015   Procedure: COLONOSCOPY WITH PROPOFOL;  Surgeon: Sherrilyn Rist, MD;  Location: [facility] ENDOSCOPY;  Service: Gastroenterology;  Laterality: N/A;  . ESOPHAGOGASTRODUODENOSCOPY N/A 01/28/2015   Procedure: ESOPHAGOGASTRODUODENOSCOPY (EGD);  Surgeon: Beverley Fiedler, MD;  Location: [facility] ENDOSCOPY;  Service: Endoscopy;  Laterality: N/A;  . ESOPHAGOGASTRODUODENOSCOPY (EGD) WITH PROPOFOL N/A 10/21/2015   Procedure: ESOPHAGOGASTRODUODENOSCOPY (EGD) WITH PROPOFOL;  Surgeon: Sherrilyn Rist, MD;  Location: [facility] ENDOSCOPY;  Service: Gastroenterology;  Laterality: N/A;  . GASTRECTOMY  In her 40s versus 50s.   for ulcer.  No details as to the extent of surgery.  Marland Kitchen HERNIA REPAIR    . HIP FRACTURE SURGERY    . JOINT REPLACEMENT     b/l hips     OB  History   No obstetric history on file.     Family History  Problem Relation Age of Onset  . Heart disease Mother        died age 3 MI  . Hypertension Mother   . Alcohol abuse Father   . Cancer Other        sister, 1 brother (lung) 1 brother (colon), daughter x 2 deceased 1 died age 79 pancreatitic another died age 71 lung  . Diabetes Son   . COPD Son   . Heart Problems Daughter     Social History   Tobacco Use  . Smoking status: Former Smoker    Types: Cigarettes    Quit date: 05/26/2014    Years since quitting: 6.1  . Smokeless tobacco: Former Engineer, water Use Topics  .  Alcohol use: No    Alcohol/week: 0.0 standard drinks  . Drug use: No    Home Medications Prior to Admission medications   Medication Sig Start Date End Date Taking? Authorizing Provider  acetaminophen (TYLENOL) 325 MG tablet Take 325 mg by mouth every 6 (six) hours as needed for moderate pain.   Yes [provider]  albuterol (PROVENTIL) (2.5 MG/3ML) 0.083% nebulizer solution Take 3 mLs (2.5 mg total) by nebulization every 6 (six) hours as needed for wheezing or shortness of breath. 01/29/15  Yes Marinda Elk, MD  aspirin EC 81 MG tablet Take 1 tablet (81 mg total) by mouth daily. 02/12/15  Yes Marinda Elk, MD  benazepril (LOTENSIN) 10 MG tablet Take 10 mg by mouth daily.  02/28/14  Yes [provider]  calcium carbonate (OS-CAL - DOSED IN MG OF ELEMENTAL CALCIUM) 1250 MG tablet Take 1 tablet (500 mg of elemental calcium total) by mouth 2 (two) times daily with a meal. 08/14/14  Yes Marrian Salvage, MD  escitalopram (LEXAPRO) 10 MG tablet Take 10 mg by mouth daily.    Yes [provider]  ferrous sulfate 325 (65 FE) MG tablet Take 325 mg by mouth daily with breakfast.   Yes [provider]  ipratropium-albuterol (DUONEB) 0.5-2.5 (3) MG/3ML SOLN Take 3 mLs by nebulization 2 (two) times daily as needed (for breathing).    Yes [provider]  levothyroxine (SYNTHROID) 50 MCG tablet Take 50 mcg by mouth daily. 06/07/20  Yes [provider]  ondansetron (ZOFRAN) 4 MG tablet Take 4 mg by mouth every 8 (eight) hours as needed for nausea or vomiting.  10/06/15  Yes [provider]  pantoprazole (PROTONIX) 40 MG tablet Take 40 mg by mouth daily.   Yes [provider]  Propylene Glycol (SYSTANE BALANCE) 0.6 % SOLN Apply 1 drop to eye daily as needed (for dry eyes).    Yes [provider]  protective barrier (RESTORE) CREA Apply 1 application topically every other day.    Yes [provider]     Allergies    Patient has no known allergies.  Review of Systems   Review of Systems  Unable to perform ROS: Acuity of condition    Physical Exam Updated Vital Signs BP 121/78   Pulse 60   Temp 97.6 F (36.4 C) (Oral)   Resp (!) 21   Ht 4\' 11"  (1.499 m)   SpO2 100%   BMI 17.59 kg/m   Physical Exam Vitals and nursing note reviewed.  Constitutional:      General: She is not in acute distress.    Appearance: She is well-developed. She is not  diaphoretic.     Comments: Frail, chronically ill appearing  HENT:     Head: Normocephalic and atraumatic.     Right Ear: External ear normal.     Left Ear: External ear normal.     Nose: Nose normal.  Eyes:     General:        Right eye: No discharge.        Left eye: No discharge.  Cardiovascular:     Rate and Rhythm: Regular rhythm. Tachycardia present.     Heart sounds: Normal heart sounds.  Pulmonary:     Effort: Pulmonary effort is normal.     Breath sounds: Normal breath sounds.  Abdominal:     Palpations: Abdomen is soft.     Tenderness: There is generalized abdominal tenderness.  Musculoskeletal:     Right lower leg: No edema.     Left lower leg: No edema.  Skin:    General: Skin is warm and dry.  Neurological:     Mental Status: She is alert.  Psychiatric:        Mood and Affect: Mood is not anxious.     ED Results / Procedures / Treatments   Labs (all labs ordered are listed, but only abnormal results are displayed) Labs Reviewed  COMPREHENSIVE METABOLIC PANEL - Abnormal; Notable for the following components:      Result Value   Glucose, Bld 136 (*)    Total Protein 5.7 (*)    Albumin 3.2 (*)    GFR, Estimated 60 (*)    All other components within normal limits  TROPONIN I (HIGH SENSITIVITY) - Abnormal; Notable for the following components:   Troponin I (High Sensitivity) 43 (*)    All other components within normal limits  RESPIRATORY PANEL BY RT PCR (FLU A&B, COVID)  LIPASE, BLOOD  CBC WITH  DIFFERENTIAL/PLATELET  MAGNESIUM  URINALYSIS, ROUTINE W REFLEX MICROSCOPIC  TROPONIN I (HIGH SENSITIVITY)    EKG EKG Interpretation  Date/Time:  Thursday July 10 2020 12:26:50 EDT Ventricular Rate:  153 PR Interval:    QRS Duration: 94 QT Interval:  301 QTC Calculation: 481 R Axis:   4 Text Interpretation: Supraventricular tachycardia Repolarization abnormality, prob rate related SVT appears new since 2020 Confirmed by Pricilla LovelessGoldston, Arnita Koons [phone]([id number]) on 07/10/2020 12:35:33 PM   EKG Interpretation  Date/Time:  Thursday July 10 2020 12:46:05 EDT Ventricular Rate:  88 PR Interval:    QRS Duration: 85 QT Interval:  363 QTC Calculation: 440 R Axis:   21 Text Interpretation: Sinus rhythm Borderline prolonged PR interval Low voltage, precordial leads Abnormal R-wave progression, early transition Minimal ST depression, anterolateral leads Baseline wander in lead(s) I II aVR SVT and ST depression improved compared to earlier in the day Confirmed by Pricilla LovelessGoldston, Dejon Lukas [phone]) on 07/10/2020 3:28:48 PM       Radiology DG Chest Portable 1 View  Result Date: 07/10/2020 CLINICAL DATA:  Supraventricular tachycardia. EXAM: PORTABLE CHEST 1 VIEW COMPARISON:  April 22, 2020. FINDINGS: Stable cardiomediastinal silhouette. No pneumothorax or pleural effusion is noted. Both lungs are clear. The visualized skeletal structures are unremarkable. IMPRESSION: No active disease. Aortic Atherosclerosis (ICD10-I70.0). Electronically Signed   By: Lupita RaiderJames  Green Jr M.D.   On: 07/10/2020 13:26    Procedures Procedures (including critical care time)  Medications Ordered in ED Medications  sodium chloride 0.9 % bolus 1,000 mL (0 mLs Intravenous Stopped 07/10/20 1537)  adenosine (ADENOCARD) 6 MG/2ML injection 6 mg (6 mg Intravenous Given 07/10/20 1240)  iohexol (OMNIPAQUE)  300 MG/ML solution 75 mL (75 mLs Intravenous Contrast Given 07/10/20 1550)    ED Course  I have reviewed the triage vital signs and the  nursing notes.  Pertinent labs & imaging results that were available during my care of the patient were reviewed by me and considered in my medical decision making (see chart for details).    MDM Rules/Calculators/A&P                          Daughter who helps take care of patient came by and endorse that patient started to not feel well after eating breakfast.  She apparently started to become pale and was talking about her gums hurting look like she might pass out.  Did have one episode of emesis. This was probably all secondary to the SVT. Patient's SVT was converted with adenosine.  She was given IV fluids.  Her labs are unremarkable besides a troponin elevation of 43.  This is probably secondary to the high heart rate.  Given her overall frailty, she lives at home alone, and did not tolerate SVT very well today, I think she should be observed.  Currently pending CT abdomen and pelvis for abdominal pain and vomiting. Care to Dr. Particia Nearing.  Final Clinical Impression(s) / ED Diagnoses Final diagnoses:  SVT (supraventricular tachycardia) (HCC)    Rx / DC Orders ED Discharge Orders    None       Pricilla Loveless, MD 07/10/20 1617

## 2020-07-10 NOTE — ED Notes (Signed)
Pt sitting up in bed watching TV. Denies any complaints at this time.

## 2020-07-11 ENCOUNTER — Encounter (HOSPITAL_COMMUNITY): Payer: Self-pay | Admitting: Internal Medicine

## 2020-07-11 ENCOUNTER — Other Ambulatory Visit: Payer: Self-pay

## 2020-07-11 ENCOUNTER — Observation Stay (HOSPITAL_BASED_OUTPATIENT_CLINIC_OR_DEPARTMENT_OTHER): Payer: Medicare Other

## 2020-07-11 DIAGNOSIS — R9431 Abnormal electrocardiogram [ECG] [EKG]: Secondary | ICD-10-CM | POA: Diagnosis not present

## 2020-07-11 DIAGNOSIS — I340 Nonrheumatic mitral (valve) insufficiency: Secondary | ICD-10-CM | POA: Diagnosis not present

## 2020-07-11 DIAGNOSIS — I471 Supraventricular tachycardia: Secondary | ICD-10-CM | POA: Diagnosis not present

## 2020-07-11 DIAGNOSIS — I361 Nonrheumatic tricuspid (valve) insufficiency: Secondary | ICD-10-CM | POA: Diagnosis not present

## 2020-07-11 DIAGNOSIS — I350 Nonrheumatic aortic (valve) stenosis: Secondary | ICD-10-CM | POA: Diagnosis not present

## 2020-07-11 DIAGNOSIS — I248 Other forms of acute ischemic heart disease: Secondary | ICD-10-CM

## 2020-07-11 LAB — COMPREHENSIVE METABOLIC PANEL
ALT: 16 U/L (ref 0–44)
AST: 21 U/L (ref 15–41)
Albumin: 2.9 g/dL — ABNORMAL LOW (ref 3.5–5.0)
Alkaline Phosphatase: 66 U/L (ref 38–126)
Anion gap: 8 (ref 5–15)
BUN: 17 mg/dL (ref 8–23)
CO2: 25 mmol/L (ref 22–32)
Calcium: 9.6 mg/dL (ref 8.9–10.3)
Chloride: 108 mmol/L (ref 98–111)
Creatinine, Ser: 0.68 mg/dL (ref 0.44–1.00)
GFR, Estimated: >60 mL/min (ref >60)
Glucose, Bld: 80 mg/dL (ref 70–99)
Potassium: 3.9 mmol/L (ref 3.5–5.1)
Sodium: 141 mmol/L (ref 135–145)
Total Bilirubin: 0.7 mg/dL (ref 0.3–1.2)
Total Protein: 4.8 g/dL — ABNORMAL LOW (ref 6.5–8.1)

## 2020-07-11 LAB — CBC
HCT: 34.5 % — ABNORMAL LOW (ref 36.0–46.0)
Hemoglobin: 10.2 g/dL — ABNORMAL LOW (ref 12.0–15.0)
MCH: 27.5 pg (ref 26.0–34.0)
MCHC: 29.6 g/dL — ABNORMAL LOW (ref 30.0–36.0)
MCV: 93 fL (ref 80.0–100.0)
Platelets: 158 10*3/uL (ref 150–400)
RBC: 3.71 MIL/uL — ABNORMAL LOW (ref 3.87–5.11)
RDW: 13 % (ref 11.5–15.5)
WBC: 4.4 10*3/uL (ref 4.0–10.5)
nRBC: 0 % (ref 0.0–0.2)

## 2020-07-11 LAB — VITAMIN B12: Vitamin B-12: 183 pg/mL (ref 180–914)

## 2020-07-11 LAB — IRON AND TIBC
Iron: 79 ug/dL (ref 28–170)
Saturation Ratios: 21 % (ref 10.4–31.8)
TIBC: 382 ug/dL (ref 250–450)
UIBC: 303 ug/dL

## 2020-07-11 LAB — ECHOCARDIOGRAM COMPLETE
Area-P 1/2: 2.87 cm2
Height: 59 in
P 1/2 time: 727 msec
S' Lateral: 2.9 cm

## 2020-07-11 LAB — TROPONIN I (HIGH SENSITIVITY)
Troponin I (High Sensitivity): 565 ng/L (ref <18)
Troponin I (High Sensitivity): 449 ng/L (ref <18)
Troponin I (High Sensitivity): 396 ng/L (ref <18)
Troponin I (High Sensitivity): 371 ng/L (ref <18)

## 2020-07-11 LAB — FOLATE: Folate: 17.2 ng/mL (ref >5.9)

## 2020-07-11 LAB — RETICULOCYTES
Immature Retic Fract: 6.9 % (ref 2.3–15.9)
RBC.: 4.45 MIL/uL (ref 3.87–5.11)
Retic Count, Absolute: 41.8 10*3/uL (ref 19.0–186.0)
Retic Ct Pct: 0.9 % (ref 0.4–3.1)

## 2020-07-11 LAB — TSH: TSH: 1.329 u[IU]/mL (ref 0.350–4.500)

## 2020-07-11 LAB — FERRITIN: Ferritin: 22 ng/mL (ref 11–307)

## 2020-07-11 LAB — T4, FREE: Free T4: 0.91 ng/dL (ref 0.61–1.12)

## 2020-07-11 MED ORDER — CYANOCOBALAMIN 1000 MCG/ML IJ SOLN
1000.0000 ug | Freq: Once | INTRAMUSCULAR | Status: AC
  Administered 2020-07-11: 1000 ug via INTRAMUSCULAR
  Filled 2020-07-11: qty 1

## 2020-07-11 NOTE — ED Notes (Signed)
Paged speech therapy in regards to patient's consult. Awaiting response from speech therapy for ETA on speech eval.

## 2020-07-11 NOTE — ED Notes (Signed)
Upon giving patient cranberry juice RN narrating assessed patient coughing when drinking thin liquids. Notified MD in regards to failed swallow screen.

## 2020-07-11 NOTE — ED Notes (Signed)
Speech eval in progress at this time.

## 2020-07-11 NOTE — Progress Notes (Signed)
PROGRESS NOTE    April Mccoy  BJY:[id number] DOB: 28-Apr-1929 DOA: 07/10/2020  PCP: Ailene Ravel, MD    Brief Narrative: April Mccoy is a 86 y.o. female with medical history significant of anemia, COPD, depression, GERD/history of duodenal ulcer, hypertension, hypothyroidism who presents with recent nausea and vomiting and was found to be in SVT by EMS.  History obtained with assistance of chart review and patient's daughter as she is a difficult historian.  She has had some episodes of nausea vomiting this morning that she felt better after vomiting. EMS was called and her heart rate is found to be in the 180s and apparent SVT.  She received 1 dose of adenosine in the ED which converted her rhythm back to sinus.  She denies chest pain, shortness of breath, abdominal pain, constipation or diarrhea when seen.  ED Course: Vitals in the ED significant for SVT initially which resolved with adenosine as above, otherwise vital signs stable.  Lab work showed normal BMP, normal magnesium, normal CBC.  LFTs showed mildly low protein and albumin.  Troponin up trended from 43-2 38.  Respiratory panel was negative.  UA and lipase pending.  Patient was seen by cardiology in the ED who recommended starting a beta-blocker and observing overnight.  Chest x-ray was without acute abnormality and CT abdomen showed chronic changes without acute abnormality.   Assessment & Plan: Principal Problem:   SVT (supraventricular tachycardia) (HCC): Pt has been started on responded well with metoprolol. TFT check.     GERD (gastroesophageal reflux disease) IV PPI.     COPD (chronic obstructive pulmonary disease) (HCC) Stable cont duoneb as needed.    Elevated troponin Attribute to demand ischemia. Cardiology following.   Anemia: Hemoglobin has dropped from 12 to 10.2 We will obtain iron panel c/w ACD and will transfuse as needed.  B12 def: Level 183 pt givne 1000 mcg of cyanocobalamin.   DVT  prophylaxis: Heparin          Code Status: DNR Family Communication: None at bedside Disposition Plan: TBD Status is: Observation  The patient remains OBS appropriate and will d/c before 2 midnights.  Dispo: The patient is from: Home              Anticipated d/c is to: Home              Anticipated d/c date is: 2 days              Patient currently is not medically stable to d/c.  Consultants:  Cardiology Dr. Bjorn Pippin.  Procedures:  None  Subjective: Pt is seen today she is asymptomatic and denies any complaints of chest pain or sob. Pt is alert and oriented.She came in SVT rhythm which I suspect was caused by underlying issues.  Objective: Vitals:   07/11/20 0800 07/11/20 1004 07/11/20 1030 07/11/20 1315  BP: (!) 153/75 129/65 121/68 (!) 120/55  Pulse: (!) 56 (!) 50 (!) 46 (!) 45  Resp: Temp:      TempSrc:      SpO2: 100% 98% 95% 100%  Height:       SpO2: 100 % O2 Flow Rate (L/min): 2 L/min No intake or output data in the 24 hours ending 07/11/20 1532 There were no vitals filed for this visit.  Examination: Blood pressure (!) 120/55, pulse (!) 45, temperature 97.6 F (36.4 C), temperature source Oral, resp. rate 16, height  (1.499 m), SpO2 100 %. General  exam: Appears frail and thin.  HEENT:EOMI, perrl  Respiratory system: Clear to auscultation. Respiratory effort normal. Cardiovascular system: S1 & S2 heard, RRR. No JVD, + in apt area.murmurs, rubs, gallops or clicks. No pedal edema. Gastrointestinal system: Abdomen is nondistended, soft and nontender. No organomegaly or masses felt. Normal bowel sounds heard. Central nervous system: Alert and oriented.CN grossly intact No focal neurological deficits. Extremities: pt moving all 4 ext and ambulating. Skin: No rashes, lesions or ulcers Psychiatry: Judgement and insight appear normal. Mood & affect appropriate.   Data Reviewed: I have personally reviewed following labs and imaging studies  No  intake/output data recorded. No intake/output data recorded. Lab Results  Component Value Date   CREATININE 0.68 07/11/2020   CREATININE 0.92 07/10/2020   CREATININE 0.85 01/26/2019   CBC: Recent Labs  Lab 07/10/20 1248 07/11/20 0223  WBC 5.9 4.4  NEUTROABS 3.9  --   HGB 12.0 10.2*  HCT 40.0 34.5*  MCV 93.7 93.0  PLT 200 158   Basic Metabolic Panel: Recent Labs  Lab 07/10/20 1248 07/11/20 0223  NA 141 141  K 3.9 3.9  CL 108 108  CO2 26 25  GLUCOSE 136* 80  BUN 20 17  CREATININE 0.92 0.68  CALCIUM 10.2 9.6  MG 2.1  --    GFR: CrCl cannot be calculated (Unknown ideal weight.). Liver Function Tests: Recent Labs  Lab 07/10/20 1248 07/11/20 0223  AST 21 21  ALT 15 16  ALKPHOS 80 66  BILITOT 0.7 0.7  PROT 5.7* 4.8*  ALBUMIN 3.2* 2.9*   Recent Labs  Lab 07/10/20 1248  LIPASE 35   No results for input(s): AMMONIA in the last 168 hours.  Coagulation Profile: No results for input(s): INR, PROTIME in the last 168 hours. Cardiac Enzymes: No results for input(s): CKTOTAL, CKMB, CKMBINDEX, TROPONINI in the last 168 hours. BNP (last 3 results) No results for input(s): PROBNP in the last 8760 hours. HbA1C: No results for input(s): HGBA1C in the last 72 hours. CBG: No results for input(s): GLUCAP in the last 168 hours. Lipid Profile: No results for input(s): CHOL, HDL, LDLCALC, TRIG, CHOLHDL, LDLDIRECT in the last 72 hours. Thyroid Function Tests: Recent Labs    07/11/20 0828  TSH 1.329  FREET4 0.91   Anemia Panel: Recent Labs    07/11/20 0828  VITAMINB12 183  FOLATE 17.2  FERRITIN 22  TIBC 382  IRON 79  RETICCTPCT 0.9   Sepsis Labs: No results for input(s): PROCALCITON, LATICACIDVEN in the last 168 hours.  Recent Results (from the past 240 hour(s))  Respiratory Panel by RT PCR (Flu A&B, Covid) - Nasopharyngeal Swab     Status: None   Collection Time: 07/10/20  3:56 PM   Specimen: Nasopharyngeal Swab  Result Value Ref Range Status   SARS  Coronavirus 2 by RT PCR NEGATIVE NEGATIVE Final    Comment: (NOTE) SARS-CoV-2 target nucleic acids are NOT DETECTED.  The SARS-CoV-2 RNA is generally detectable in upper respiratoy specimens during the acute phase of infection. The lowest concentration of SARS-CoV-2 viral copies this assay can detect is 131 copies/mL. A negative result does not preclude SARS-Cov-2 infection and should not be used as the sole basis for treatment or other patient management decisions. A negative result may occur with  improper specimen collection/handling, submission of specimen other than nasopharyngeal swab, presence of viral mutation(s) within the areas targeted by this assay, and inadequate number of viral copies (<131 copies/mL). A negative result must be combined with clinical  observations, patient history, and epidemiological information. The expected result is Negative.  Fact Sheet for Patients:  https://www.moore.com/  Fact Sheet for Healthcare Providers:  https://www.young.biz/  This test is no t yet approved or cleared by the [place] FDA and  has been authorized for detection and/or diagnosis of SARS-CoV-2 by FDA under an Emergency Use Authorization (EUA). This EUA will remain  in effect (meaning this test can be used) for the duration of the COVID-19 declaration under Section 564(b)(1) of the Act, 21 U.S.C. section 360bbb-3(b)(1), unless the authorization is terminated or revoked sooner.     Influenza A by PCR NEGATIVE NEGATIVE Final   Influenza B by PCR NEGATIVE NEGATIVE Final    Comment: (NOTE) The Xpert Xpress SARS-CoV-2/FLU/RSV assay is intended as an aid in  the diagnosis of influenza from Nasopharyngeal swab specimens and  should not be used as a sole basis for treatment. Nasal washings and  aspirates are unacceptable for Xpert Xpress SARS-CoV-2/FLU/RSV  testing.  Fact Sheet for  Patients: https://www.moore.com/  Fact Sheet for Healthcare Providers: https://www.young.biz/  This test is not yet approved or cleared by the [place] FDA and  has been authorized for detection and/or diagnosis of SARS-CoV-2 by  FDA under an Emergency Use Authorization (EUA). This EUA will remain  in effect (meaning this test can be used) for the duration of the  Covid-19 declaration under Section 564(b)(1) of the Act, 21  U.S.C. section 360bbb-3(b)(1), unless the authorization is  terminated or revoked. Performed at [facility], [address], [place], [place] [address]       Radiology Studies: CT ABDOMEN PELVIS W CONTRAST  Result Date: 07/10/2020 CLINICAL DATA:  Nausea and vomiting EXAM: CT ABDOMEN AND PELVIS WITH CONTRAST TECHNIQUE: Multidetector CT imaging of the abdomen and pelvis was performed using the standard protocol following bolus administration of intravenous contrast. CONTRAST:  13mL OMNIPAQUE IOHEXOL 300 MG/ML  SOLN COMPARISON:  None. FINDINGS: Lower chest: Lung bases demonstrate some mild peribronchial thickening as well as mild left basilar atelectasis. Hepatobiliary: Fatty infiltration of the liver is noted. The gallbladder appears within normal limits. Pancreas: Unremarkable. No pancreatic ductal dilatation or surrounding inflammatory changes. Spleen: Normal in size without focal abnormality. Adrenals/Urinary Tract: Adrenal glands are within normal limits. Kidneys demonstrate a normal enhancement pattern bilaterally. Small lower pole right renal cyst and left upper pole renal cyst are noted. No calculi are seen. No obstructive changes are noted. The bladder is partially distended. Stomach/Bowel: No obstructive or inflammatory changes of the colon are noted. The appendix is not well visualized although no inflammatory changes are seen to suggest appendicitis. Small bowel and stomach appear within normal limits.  Vascular/Lymphatic: Atherosclerotic calcifications of the abdominal aorta are noted. No aneurysmal dilatation is seen. No significant lymphadenopathy is noted. Reproductive: Status post hysterectomy. No adnexal masses. Other: No abdominal wall hernia or abnormality. No abdominopelvic ascites. Musculoskeletal: Postsurgical changes in the proximal femurs are noted bilaterally. Generalized osteopenia is seen. Compression deformities are noted at L2, L3 and L4 as well as T11 and T12. These are stable from a prior plain film examination from February of 2020 IMPRESSION: Mild left basilar atelectasis. Bilateral renal cysts. Chronic compression deformities in the lumbar spine and thoracic spine Aortic Atherosclerosis (ICD10-I70.0). Electronically Signed   By: Alcide Clever M.D.   On: 07/10/2020 17:17   DG Chest Portable 1 View  Result Date: 07/10/2020 CLINICAL DATA:  Supraventricular tachycardia. EXAM: PORTABLE CHEST 1 VIEW COMPARISON:  April 22, 2020. FINDINGS: Stable cardiomediastinal  silhouette. No pneumothorax or pleural effusion is noted. Both lungs are clear. The visualized skeletal structures are unremarkable. IMPRESSION: No active disease. Aortic Atherosclerosis (ICD10-I70.0). Electronically Signed   By: Lupita Raider M.D.   On: 07/10/2020 13:26   ECHOCARDIOGRAM COMPLETE  Result Date: 07/11/2020    ECHOCARDIOGRAM REPORT   Patient Name:   April Mccoy Date of Exam: 07/11/2020 Medical Rec #:  [id number]       Height:       59.0 in Accession #:    [id number]      Weight:       87.1 lb Date of Birth:  [birth year]        BSA:          1.295 m Patient Age:    89 years        BP:           153/75 mmHg Patient Gender: F               HR:           55 bpm. Exam Location:  Inpatient Procedure: 2D Echo, Color Doppler and Cardiac Doppler Indications:    R94.31 Abnormal EKG  History:        Patient has no prior history of Echocardiogram examinations.                 Stroke and COPD, Signs/Symptoms:Shortness of Breath  and Dyspnea;                 Risk Factors:Current Smoker and Hypertension.  Sonographer:    Eulah Pont RDCS Referring Phys: [id number] CHRISTOPHER L SCHUMANN IMPRESSIONS  1. Left ventricular ejection fraction, by estimation, is 55 to 60%. The left ventricle has normal function. The left ventricle has no regional wall motion abnormalities. Left ventricular diastolic parameters are consistent with Grade II diastolic dysfunction (pseudonormalization).  2. Right ventricular systolic function is normal. The right ventricular size is normal. There is mildly elevated pulmonary artery systolic pressure.  3. Left atrial size was mildly dilated.  4. Mild mitral valve regurgitation.  5. Tricuspid valve regurgitation is mild to moderate.  6. The aortic valve is abnormal. Aortic valve regurgitation is mild to moderate. Mild aortic valve sclerosis is present, with no evidence of aortic valve stenosis.  7. The inferior vena cava is normal in size with greater than 50% respiratory variability, suggesting right atrial pressure of 3 mmHg. FINDINGS  Left Ventricle: Left ventricular ejection fraction, by estimation, is 55 to 60%. The left ventricle has normal function. The left ventricle has no regional wall motion abnormalities. The left ventricular internal cavity size was normal in size. There is  no left ventricular hypertrophy. Left ventricular diastolic parameters are consistent with Grade II diastolic dysfunction (pseudonormalization). Right Ventricle: The right ventricular size is normal. No increase in right ventricular wall thickness. Right ventricular systolic function is normal. There is mildly elevated pulmonary artery systolic pressure. The tricuspid regurgitant velocity is 2.96  m/s, and with an assumed right atrial pressure of 3 mmHg, the estimated right ventricular systolic pressure is 38.0 mmHg. Left Atrium: Left atrial size was mildly dilated. Right Atrium: Right atrial size was normal in size. Pericardium: There is  no evidence of pericardial effusion. Mitral Valve: The mitral valve is abnormal. There is mild thickening of the mitral valve leaflet(s). Mild mitral valve regurgitation. Tricuspid Valve: The tricuspid valve is normal in structure. Tricuspid valve regurgitation is mild to moderate. Aortic Valve: The aortic valve is  abnormal. Aortic valve regurgitation is mild to moderate. Aortic regurgitation PHT measures 727 msec. Mild aortic valve sclerosis is present, with no evidence of aortic valve stenosis. Pulmonic Valve: The pulmonic valve was not well visualized. Pulmonic valve regurgitation is mild. Aorta: The aortic root is normal in size and structure. Venous: The inferior vena cava is normal in size with greater than 50% respiratory variability, suggesting right atrial pressure of 3 mmHg. IAS/Shunts: No atrial level shunt detected by color flow Doppler.  LEFT VENTRICLE PLAX 2D LVIDd:         3.70 cm  Diastology LVIDs:         2.90 cm  LV e' medial:    4.58 cm/s LV PW:         0.80 cm  LV E/e' medial:  17.7 LV IVS:        0.80 cm  LV e' lateral:   5.10 cm/s LVOT diam:     1.80 cm  LV E/e' lateral: 15.9 LV SV:         62 LV SV Index:   48 LVOT Area:     2.54 cm  RIGHT VENTRICLE RV S prime:     7.29 cm/s TAPSE (M-mode): 1.4 cm LEFT ATRIUM           Index       RIGHT ATRIUM           Index LA diam:      2.90 cm 2.24 cm/m  RA Area:     11.20 cm LA Vol (A2C): 51.1 ml 39.45 ml/m RA Volume:   27.00 ml  20.84 ml/m LA Vol (A4C): 32.7 ml 25.24 ml/m  AORTIC VALVE LVOT Vmax:   76.50 cm/s LVOT Vmean:  52.400 cm/s LVOT VTI:    0.245 m AI PHT:      727 msec  AORTA Ao Root diam: 3.30 cm MITRAL VALVE               TRICUSPID VALVE MV Area (PHT): 2.87 cm    TR Peak grad:   35.0 mmHg MV Decel Time: 264 msec    TR Vmax:        296.00 cm/s MV E velocity: 81.00 cm/s MV A velocity: 73.20 cm/s  SHUNTS MV E/A ratio:  1.11        Systemic VTI:  0.24 m                            Systemic Diam: 1.80 cm Dietrich Pates MD Electronically signed by  Dietrich Pates MD Signature Date/Time: 07/11/2020/3:24:21 PM    Final       Current Facility-Administered Medications (Endocrine & Metabolic):  .  levothyroxine (SYNTHROID) tablet 50 mcg  Current Outpatient Medications (Endocrine & Metabolic):  .  levothyroxine (SYNTHROID) 50 MCG tablet, Take 50 mcg by mouth daily.  Current Facility-Administered Medications (Cardiovascular):  .  benazepril (LOTENSIN) tablet 10 mg .  metoprolol succinate (TOPROL-XL) 24 hr tablet 12.5 mg  Current Outpatient Medications (Cardiovascular):  .  benazepril (LOTENSIN) 10 MG tablet, Take 10 mg by mouth daily.   Current Facility-Administered Medications (Respiratory):  .  ipratropium-albuterol (DUONEB) 0.5-2.5 (3) MG/3ML nebulizer solution 3 mL  Current Outpatient Medications (Respiratory):  .  albuterol (PROVENTIL) (2.5 MG/3ML) 0.083% nebulizer solution, Take 3 mLs (2.5 mg total) by nebulization every 6 (six) hours as needed for wheezing or shortness of breath. Marland Kitchen  ipratropium-albuterol (DUONEB) 0.5-2.5 (3) MG/3ML SOLN, Take 3  mLs by nebulization 2 (two) times daily as needed (for breathing).   Current Facility-Administered Medications (Analgesics):  .  acetaminophen (TYLENOL) tablet 650 mg **OR** acetaminophen (TYLENOL) suppository 650 mg  .  aspirin EC tablet 81 mg  Current Outpatient Medications (Analgesics):  .  acetaminophen (TYLENOL) 325 MG tablet, Take 325 mg by mouth every 6 (six) hours as needed for moderate pain. Marland Kitchen  aspirin EC 81 MG tablet, Take 1 tablet (81 mg total) by mouth daily.  Current Facility-Administered Medications (Hematological):  .  ferrous sulfate tablet 325 mg .  heparin injection 5,000 Units  Current Outpatient Medications (Hematological):  .  ferrous sulfate 325 (65 FE) MG tablet, Take 325 mg by mouth daily with breakfast.  Current Facility-Administered Medications (Other):  .  calcium carbonate (OS-CAL - dosed in mg of elemental calcium) tablet 500 mg of elemental calcium .   escitalopram (LEXAPRO) tablet 10 mg .  liver oil-zinc oxide (DESITIN) 40 % ointment 1 application .  pantoprazole (PROTONIX) EC tablet 40 mg .  sodium chloride flush (NS) 0.9 % injection 3 mL  Current Outpatient Medications (Other):  .  calcium carbonate (OS-CAL - DOSED IN MG OF ELEMENTAL CALCIUM) 1250 MG tablet, Take 1 tablet (500 mg of elemental calcium total) by mouth 2 (two) times daily with a meal. .  escitalopram (LEXAPRO) 10 MG tablet, Take 10 mg by mouth daily.  .  ondansetron (ZOFRAN) 4 MG tablet, Take 4 mg by mouth every 8 (eight) hours as needed for nausea or vomiting.  .  pantoprazole (PROTONIX) 40 MG tablet, Take 40 mg by mouth daily. Marland Kitchen  Propylene Glycol (SYSTANE BALANCE) 0.6 % SOLN, Apply 1 drop to eye daily as needed (for dry eyes).  .  protective barrier (RESTORE) CREA, Apply 1 application topically every other day.   Anti-infectives (From admission, onward)   None      Scheduled Meds: . aspirin EC  81 mg Oral Daily  . benazepril  10 mg Oral Daily  . calcium carbonate  1 tablet Oral BID WC  . escitalopram  10 mg Oral Daily  . ferrous sulfate  325 mg Oral Daily  . heparin  5,000 Units Subcutaneous Q8H  . levothyroxine  50 mcg Oral Daily  . liver oil-zinc oxide  1 application Topical QODAY  . metoprolol succinate  12.5 mg Oral Daily  . pantoprazole  40 mg Oral Daily  . sodium chloride flush  3 mL Intravenous Q12H   Continuous Infusions:   LOS: 0 days    Gertha Calkin, MD [facility] Pager [phone] If 7PM-7AM, please contact night-coverage [email or web address] Password [facility] 07/11/2020, 3:32 PM

## 2020-07-11 NOTE — Progress Notes (Addendum)
Progress Note  Patient Name: April Mccoy Date of Encounter: 07/11/2020  [facility] Cardiologist: No primary care provider on file.   Subjective   Denies chest pain, dyspnea, lightheadedness  Inpatient Medications    Scheduled Meds: . aspirin EC  81 mg Oral Daily  . benazepril  10 mg Oral Daily  . calcium carbonate  1 tablet Oral BID WC  . escitalopram  10 mg Oral Daily  . ferrous sulfate  325 mg Oral Daily  . heparin  5,000 Units Subcutaneous Q8H  . levothyroxine  50 mcg Oral Daily  . liver oil-zinc oxide  1 application Topical QODAY  . metoprolol succinate  12.5 mg Oral Daily  . pantoprazole  40 mg Oral Daily  . sodium chloride flush  3 mL Intravenous Q12H   Continuous Infusions:  PRN Meds: acetaminophen **OR** acetaminophen, ipratropium-albuterol   Vital Signs    Vitals:   07/11/20 0600 07/11/20 0615 07/11/20 0730 07/11/20 0800  BP: 124/60 124/66 123/67 (!) 153/75  Pulse: (!) 46 (!) 46 (!) 50 (!) 56  Resp: 12 16 15 15   Temp:      TempSrc:      SpO2: 99% 98% 100% 100%  Height:       No intake or output data in the 24 hours ending 07/11/20 0945 Last 3 Weights 01/27/2019 10/28/2018 10/20/2015  Weight (lbs) 87 lb 1.3 oz 85 lb 110 lb 3.7 oz  Weight (kg) 39.5 kg 38.556 kg 50 kg      Telemetry    NSR, rate 40-50s - Personally Reviewed  ECG    No new ECG- Personally Reviewed  Physical Exam   GEN: No acute distress, cachectic  Neck: No JVD Cardiac: RRR, no murmurs, rubs, or gallops.  Respiratory: Clear to auscultation bilaterally. GI: Soft, nontender, non-distended  MS: No edema; No deformity. Neuro:  Nonfocal  Psych: Normal affect   Labs    High Sensitivity Troponin:   Recent Labs  Lab 07/10/20 1248 07/10/20 1522 07/10/20 2204 07/11/20 0223 07/11/20 0828  TROPONINIHS 43* 238* 576* 565* 449*      Chemistry Recent Labs  Lab 07/10/20 1248 07/11/20 0223  NA 141 141  K 3.9 3.9  CL 108 108  CO2 26 25  GLUCOSE 136* 80  BUN 20 17   CREATININE 0.92 0.68  CALCIUM 10.2 9.6  PROT 5.7* 4.8*  ALBUMIN 3.2* 2.9*  AST 21 21  ALT 15 16  ALKPHOS 80 66  BILITOT 0.7 0.7  GFRNONAA 60* >60  ANIONGAP 7 8     Hematology Recent Labs  Lab 07/10/20 1248 07/11/20 0223 07/11/20 0828  WBC 5.9 4.4  --   RBC 4.27 3.71* 4.45  HGB 12.0 10.2*  --   HCT 40.0 34.5*  --   MCV 93.7 93.0  --   MCH 28.1 27.5  --   MCHC 30.0 29.6*  --   RDW 13.0 13.0  --   PLT 200 158  --     BNPNo results for input(s): BNP, PROBNP in the last 168 hours.   DDimer No results for input(s): DDIMER in the last 168 hours.   Radiology    CT ABDOMEN PELVIS W CONTRAST  Result Date: 07/10/2020 CLINICAL DATA:  Nausea and vomiting EXAM: CT ABDOMEN AND PELVIS WITH CONTRAST TECHNIQUE: Multidetector CT imaging of the abdomen and pelvis was performed using the standard protocol following bolus administration of intravenous contrast. CONTRAST:  38mL OMNIPAQUE IOHEXOL 300 MG/ML  SOLN COMPARISON:  None. FINDINGS:  Lower chest: Lung bases demonstrate some mild peribronchial thickening as well as mild left basilar atelectasis. Hepatobiliary: Fatty infiltration of the liver is noted. The gallbladder appears within normal limits. Pancreas: Unremarkable. No pancreatic ductal dilatation or surrounding inflammatory changes. Spleen: Normal in size without focal abnormality. Adrenals/Urinary Tract: Adrenal glands are within normal limits. Kidneys demonstrate a normal enhancement pattern bilaterally. Small lower pole right renal cyst and left upper pole renal cyst are noted. No calculi are seen. No obstructive changes are noted. The bladder is partially distended. Stomach/Bowel: No obstructive or inflammatory changes of the colon are noted. The appendix is not well visualized although no inflammatory changes are seen to suggest appendicitis. Small bowel and stomach appear within normal limits. Vascular/Lymphatic: Atherosclerotic calcifications of the abdominal aorta are noted. No  aneurysmal dilatation is seen. No significant lymphadenopathy is noted. Reproductive: Status post hysterectomy. No adnexal masses. Other: No abdominal wall hernia or abnormality. No abdominopelvic ascites. Musculoskeletal: Postsurgical changes in the proximal femurs are noted bilaterally. Generalized osteopenia is seen. Compression deformities are noted at L2, L3 and L4 as well as T11 and T12. These are stable from a prior plain film examination from February of 2020 IMPRESSION: Mild left basilar atelectasis. Bilateral renal cysts. Chronic compression deformities in the lumbar spine and thoracic spine Aortic Atherosclerosis (ICD10-I70.0). Electronically Signed   By: Alcide Clever M.D.   On: 07/10/2020 17:17   DG Chest Portable 1 View  Result Date: 07/10/2020 CLINICAL DATA:  Supraventricular tachycardia. EXAM: PORTABLE CHEST 1 VIEW COMPARISON:  April 22, 2020. FINDINGS: Stable cardiomediastinal silhouette. No pneumothorax or pleural effusion is noted. Both lungs are clear. The visualized skeletal structures are unremarkable. IMPRESSION: No active disease. Aortic Atherosclerosis (ICD10-I70.0). Electronically Signed   By: Lupita Raider M.D.   On: 07/10/2020 13:26    Cardiac Studies     Patient Profile     86 y.o. female with medical history significant of anemia, COPD, depression, GERD/history of duodenal ulcer, hypertension, hypothyroidism who presents with SVT  Assessment & Plan    SVT: resolved with adenosine.  Toprol 12.5 mg XL ordered but was discontinued as resting HR has been in 40s-50s.  Will check echocardiogram today to evaluate for structural heart disease.  Will plan cardiac monitor on discharge.  Elevated troponin: troponin peaked at 576.  Suspect demand ischemia in setting of SVT.  Will check echocardiogram as above   For questions or updates, please contact [facility] Please consult [email or web address] for contact info under        Signed, Little Ishikawa, MD   07/11/2020, 9:45 AM

## 2020-07-11 NOTE — ED Notes (Signed)
Pt in ECHO

## 2020-07-11 NOTE — Evaluation (Signed)
Physical Therapy Evaluation Patient Details Name: April Mccoy MRN: [id number] DOB: [birth year] Today's Date: 07/11/2020   History of Present Illness  Pt is an 86 y/o female admitted secondary to nausea and vomiting. Found to have SVT. PMH includes COPD, HTN and CVA.   Clinical Impression  Pt admitted secondary to problem above with deficits below. Requiring min to mod A for bed mobility, min A +2 to stand, and mod A +2 to take steps at edge of stretcher. Pt with very uncoordinated steps. Reports she normally uses WC. Pt reports daughter comes every day to assist with bathing, cooking and cleaning. Feel pt will require 24/7 assist at d/c. If family unable to provide, may need to consider SNF level therapies. Will continue to follow acutely.     Follow Up Recommendations Home health PT;Supervision/Assistance - 24 hour    Equipment Recommendations  None recommended by PT    Recommendations for Other Services       Precautions / Restrictions Precautions Precautions: Fall Restrictions Weight Bearing Restrictions: No      Mobility  Bed Mobility Overal bed mobility: Needs Assistance Bed Mobility: Supine to Sit;Sit to Supine     Supine to sit: +2 for physical assistance;+2 for safety/equipment;Min assist Sit to supine: Mod assist;+2 for physical assistance;+2 for safety/equipment   General bed mobility comments: Required assist for trunk and LEs throughout bed mobility. Pt with posterior lean in sitting, but able to correct with cues.     Transfers Overall transfer level: Needs assistance Equipment used: 2 person hand held assist Transfers: Sit to/from Stand Sit to Stand: Min assist;+2 physical assistance         General transfer comment: Min A +2 for lift assist and steadying.   Ambulation/Gait Ambulation/Gait assistance: Mod assist;+2 physical assistance Gait Distance (Feet): 1 Feet Assistive device: 2 person hand held assist Gait Pattern/deviations: Step-through  pattern;Decreased stride length;Narrow base of support Gait velocity: Decreased   General Gait Details: Uncoordinated steps noted throughout. Took forwads and backwards steps at edge of stretcher. Mod A +2 for steadying.   Stairs            Wheelchair Mobility    Modified Rankin (Stroke Patients Only)       Balance Overall balance assessment: Needs assistance Sitting-balance support: No upper extremity supported;Feet supported Sitting balance-Leahy Scale: Fair Sitting balance - Comments: Initially with posterior lean, however, able to correct with cues.    Standing balance support: Bilateral upper extremity supported;During functional activity Standing balance-Leahy Scale: Poor Standing balance comment: Reliant on BUE support and external support.                              Pertinent Vitals/Pain Pain Assessment: No/denies pain    Home Living Family/patient expects to be discharged to:: Private residence Living Arrangements: Alone Available Help at Discharge: Family (daughter available most of the time ) Type of Home: Apartment Home Access: Level entry     Home Layout: One level Home Equipment: Walker - 2 wheels;Wheelchair - manual      Prior Function Level of Independence: Needs assistance   Gait / Transfers Assistance Needed: Pt reports she can transfer to and from [facility] independently by lateral transfer.   ADL's / Homemaking Assistance Needed: Daughter assists with bathing, cooking, and cleaning.         Hand Dominance        Extremity/Trunk Assessment   Upper Extremity Assessment Upper Extremity  Assessment: Defer to OT evaluation    Lower Extremity Assessment Lower Extremity Assessment: Generalized weakness;RLE deficits/detail;LLE deficits/detail RLE Deficits / Details: Decreased coordination noted when taking steps at EOB  LLE Deficits / Details: Decreased coordination noted when taking steps at EOB     Cervical / Trunk  Assessment Cervical / Trunk Assessment: Normal  Communication   Communication: HOH  Cognition Arousal/Alertness: Awake/alert Behavior During Therapy: WFL for tasks assessed/performed Overall Cognitive Status: No family/caregiver present to determine baseline cognitive functioning                                        General Comments      Exercises     Assessment/Plan    PT Assessment Patient needs continued PT services  PT Problem List Decreased strength;Decreased balance;Decreased mobility;Decreased knowledge of use of DME;Decreased coordination;Decreased knowledge of precautions       PT Treatment Interventions DME instruction;Functional mobility training;Therapeutic activities;Therapeutic exercise;Balance training;Gait training;Patient/family education;Wheelchair mobility training    PT Goals (Current goals can be found in the Care Plan section)  Acute Rehab PT Goals Patient Stated Goal: to walk  PT Goal Formulation: With patient Time For Goal Achievement: 07/25/20 Potential to Achieve Goals: Fair    Frequency Min 3X/week   Barriers to discharge        Co-evaluation PT/OT/SLP Co-Evaluation/Treatment: Yes Reason for Co-Treatment: To address functional/ADL transfers PT goals addressed during session: Mobility/safety with mobility;Balance         AM-PAC PT "6 Clicks" Mobility  Outcome Measure Help needed turning from your back to your side while in a flat bed without using bedrails?: A Little Help needed moving from lying on your back to sitting on the side of a flat bed without using bedrails?: A Little Help needed moving to and from a bed to a chair (including a wheelchair)?: A Lot Help needed standing up from a chair using your arms (e.g., wheelchair or bedside chair)?: A Little Help needed to walk in hospital room?: A Lot Help needed climbing 3-5 steps with a railing? : Total 6 Click Score: 14    End of Session Equipment Utilized During  Treatment: Gait belt Activity Tolerance: Patient tolerated treatment well Patient left: in bed;with call bell/phone within reach (on stretcher in ED ) Nurse Communication: Mobility status PT Visit Diagnosis: Unsteadiness on feet (R26.81);Muscle weakness (generalized) (M62.81);Difficulty in walking, not elsewhere classified (R26.2)    Time: 0867-6195 PT Time Calculation (min) (ACUTE ONLY): 20 min   Charges:   PT Evaluation $PT Eval Moderate Complexity: 1 Mod          April Mccoy, PT, DPT  Acute Rehabilitation Services  Pager: [phone] Office: [phone]   Lehman Prom 07/11/2020, 10:03 AM

## 2020-07-11 NOTE — ED Notes (Signed)
Lunch Tray Ordered @ 1021. 

## 2020-07-11 NOTE — Evaluation (Signed)
Occupational Therapy Evaluation Patient Details Name: April Mccoy MRN: [id number] DOB: September 02, 1929 Today's Date: 07/11/2020    History of Present Illness Pt is an 86 y/o female admitted secondary to nausea and vomiting. Found to have SVT. PMH includes COPD, HTN and CVA.    Clinical Impression   PTA pt living alone with as needed assist from daughter. She normally uses a WC for mobility, but is able to mobilize in [facility] around her home to complete ADL and some light home tasks. Daughter comes and helps several hours throughout the day per pt. At time of eval, pt able to complete bed mobility with min A +2 and sit <> stands with min A +2. Currently requires max A for LB ADL management. Given current status and support at home, recommend HHOT to progress ADL independence in home environment. Will continue to follow per POC listed below.    Follow Up Recommendations  Home health OT;Supervision/Assistance - 24 hour    Equipment Recommendations  3 in 1 bedside commode    Recommendations for Other Services       Precautions / Restrictions Precautions Precautions: Fall Restrictions Weight Bearing Restrictions: No      Mobility Bed Mobility Overal bed mobility: Needs Assistance Bed Mobility: Supine to Sit;Sit to Supine     Supine to sit: Min assist;+2 for physical assistance;+2 for safety/equipment Sit to supine: Min assist;+2 for physical assistance;+2 for safety/equipment   General bed mobility comments: Required assist for trunk and LEs throughout bed mobility. Pt with posterior lean in sitting, but able to correct with cues.     Transfers Overall transfer level: Needs assistance Equipment used: 2 person hand held assist Transfers: Sit to/from Stand Sit to Stand: Min assist;+2 physical assistance         General transfer comment: Min A +2 for lift assist and steadying.     Balance Overall balance assessment: Needs assistance Sitting-balance support: No upper extremity  supported;Feet supported Sitting balance-Leahy Scale: Fair Sitting balance - Comments: Initially with posterior lean, however, able to correct with cues.    Standing balance support: Bilateral upper extremity supported;During functional activity Standing balance-Leahy Scale: Poor Standing balance comment: Reliant on BUE support and external support.                            ADL either performed or assessed with clinical judgement   ADL Overall ADL's : Needs assistance/impaired Eating/Feeding: Set up;Sitting   Grooming: Set up;Sitting   Upper Body Bathing: Moderate assistance;Sitting   Lower Body Bathing: Maximal assistance;Sitting/lateral leans;Sit to/from stand   Upper Body Dressing : Minimal assistance;Sitting   Lower Body Dressing: Maximal assistance;Sitting/lateral leans;Sit to/from stand   Toilet Transfer: Minimal assistance;+2 for physical assistance;+2 for safety/equipment;Stand-pivot;BSC;RW   Toileting- Clothing Manipulation and Hygiene: Moderate assistance;Sitting/lateral lean;Sit to/from stand       Functional mobility during ADLs: Minimal assistance;+2 for physical assistance;+2 for safety/equipment;Cueing for safety       Vision Patient Visual Report: No change from baseline       Perception     Praxis      Pertinent Vitals/Pain Pain Assessment: No/denies pain     Hand Dominance     Extremity/Trunk Assessment Upper Extremity Assessment Upper Extremity Assessment: Generalized weakness   Lower Extremity Assessment Lower Extremity Assessment: Generalized weakness RLE Deficits / Details: Decreased coordination noted when taking steps at EOB  LLE Deficits / Details: Decreased coordination noted when taking steps at EOB  Cervical / Trunk Assessment Cervical / Trunk Assessment: Normal   Communication Communication Communication: HOH   Cognition Arousal/Alertness: Awake/alert Behavior During Therapy: WFL for tasks  assessed/performed Overall Cognitive Status: No family/caregiver present to determine baseline cognitive functioning Area of Impairment: Problem solving;Safety/judgement;Memory                     Memory: Decreased short-term memory   Safety/Judgement: Decreased awareness of safety;Decreased awareness of deficits   Problem Solving: Slow processing;Requires verbal cues General Comments: requires increased time and cues to process basic tasks   General Comments       Exercises     Shoulder Instructions      Home Living Family/patient expects to be discharged to:: Private residence Living Arrangements: Alone Available Help at Discharge: Family (daughter most of the time) Type of Home: Apartment Home Access: Level entry     Home Layout: One level     Bathroom Shower/Tub: Chief Strategy Officer: Standard     Home Equipment: Environmental consultant - 2 wheels;Wheelchair - manual          Prior Functioning/Environment Level of Independence: Needs assistance  Gait / Transfers Assistance Needed: Pt reports she can transfer to and from [facility] independently by lateral transfer.  ADL's / Homemaking Assistance Needed: Daughter assists with bathing, cooking, and cleaning.             OT Problem List: Decreased strength;Decreased knowledge of use of DME or AE;Decreased activity tolerance;Decreased cognition;Decreased safety awareness;Impaired balance (sitting and/or standing)      OT Treatment/Interventions: Self-care/ADL training;Patient/family education;Therapeutic exercise;Balance training;Energy conservation;Therapeutic activities;DME and/or AE instruction    OT Goals(Current goals can be found in the care plan section) Acute Rehab OT Goals Patient Stated Goal: to walk  OT Goal Formulation: With patient Time For Goal Achievement: 07/25/20 Potential to Achieve Goals: Good  OT Frequency: Min 2X/week   Barriers to D/C:            Co-evaluation PT/OT/SLP  Co-Evaluation/Treatment: Yes Reason for Co-Treatment: To address functional/ADL transfers PT goals addressed during session: Mobility/safety with mobility;Balance OT goals addressed during session: ADL's and self-care;Strengthening/ROM      AM-PAC OT "6 Clicks" Daily Activity     Outcome Measure Help from another person eating meals?: A Little Help from another person taking care of personal grooming?: A Little Help from another person toileting, which includes using toliet, bedpan, or urinal?: A Lot Help from another person bathing (including washing, rinsing, drying)?: A Lot Help from another person to put on and taking off regular upper body clothing?: A Little Help from another person to put on and taking off regular lower body clothing?: A Lot 6 Click Score: 15   End of Session Equipment Utilized During Treatment: Gait belt Nurse Communication: Mobility status  Activity Tolerance: Patient tolerated treatment well Patient left: in bed;with call bell/phone within reach  OT Visit Diagnosis: Unsteadiness on feet (R26.81);Other abnormalities of gait and mobility (R26.89);Muscle weakness (generalized) (M62.81)                Time: 9798-9211 OT Time Calculation (min): 18 min Charges:  OT General Charges $OT Visit: 1 Visit OT Evaluation $OT Eval Moderate Complexity: 1 Mod  Dalphine Handing, MSOT, OTR/L Acute Rehabilitation Services [facility] Office Number: [phone] Pager: [phone] Dalphine Handing 07/11/2020, 1:11 PM

## 2020-07-11 NOTE — Evaluation (Signed)
Clinical/Bedside Swallow Evaluation Patient Details  Name: April Mccoy MRN: [id number] Date of Birth: 12/04/1928  Today's Date: 07/11/2020 Time: SLP Start Time (ACUTE ONLY): 1504 SLP Stop Time (ACUTE ONLY): 1535 SLP Time Calculation (min) (ACUTE ONLY): 31 min  Past Medical History:  Past Medical History:  Diagnosis Date  . Abnormality of gait   . Anemia   . Bowel obstruction (HCC)   . COPD (chronic obstructive pulmonary disease) (HCC)   . Depression   . Fracture of coccyx (HCC)    history  . GERD (gastroesophageal reflux disease)   . Hard of hearing   . Hypertension   . Left shoulder pain    DJD  . Shortness of breath dyspnea   . Stroke [facility])    with h/o dysphagia post stroke  . Thyroid disease   . Tobacco abuse   . UTI (lower urinary tract infection)    Enterobacter aerogenes  . Wheelchair bound    Past Surgical History:  Past Surgical History:  Procedure Laterality Date  . ABDOMINAL HYSTERECTOMY    . BALLOON DILATION N/A 01/28/2015   Procedure: BALLOON DILATION;  Surgeon: Beverley Fiedler, MD;  Location: [facility] ENDOSCOPY;  Service: Endoscopy;  Laterality: N/A;  . CATARACT EXTRACTION    . COLONOSCOPY WITH PROPOFOL N/A 10/21/2015   Procedure: COLONOSCOPY WITH PROPOFOL;  Surgeon: Sherrilyn Rist, MD;  Location: [facility] ENDOSCOPY;  Service: Gastroenterology;  Laterality: N/A;  . ESOPHAGOGASTRODUODENOSCOPY N/A 01/28/2015   Procedure: ESOPHAGOGASTRODUODENOSCOPY (EGD);  Surgeon: Beverley Fiedler, MD;  Location: [facility] ENDOSCOPY;  Service: Endoscopy;  Laterality: N/A;  . ESOPHAGOGASTRODUODENOSCOPY (EGD) WITH PROPOFOL N/A 10/21/2015   Procedure: ESOPHAGOGASTRODUODENOSCOPY (EGD) WITH PROPOFOL;  Surgeon: Sherrilyn Rist, MD;  Location: [facility] ENDOSCOPY;  Service: Gastroenterology;  Laterality: N/A;  . GASTRECTOMY  In her 40s versus 50s.   for ulcer.  No details as to the extent of surgery.  Marland Kitchen HERNIA REPAIR    . HIP FRACTURE SURGERY    . JOINT REPLACEMENT     b/l hips   HPI:  Pt is an 86 y.o.  female with medical history significant of anemia, COPD, depression, GERD/history of duodenal ulcer,  CVA, hypertension, hypothyroidism who presents with recent nausea and vomiting and was found to be in SVT by EMS. She received 1 dose of adenosine in the ED which converted her rhythm back to sinus. Per EMS, pt was seen by speech pathology in 2015 and 2016 and has chronic dysphagia with coughing with p.o. intake. Most recent MBS on 01/27/15: mild oral and mild-moderated sensory based dysphagia with delayed swallow initiation to valleculae and pyrirform sinuses. Silent aspiration of thin liquids in pyriform sinuses prematurely. Therapeutic chin tuck was not effective over multiple trials. A dysphagia 3 diet with nectar thick liquids was recommended. Esophagram 01/27/15: narrowing at the esophagogastric junction which restricted passage of barium tablet concerning for a mild stricture. Mild GERD. Mild intermittent tertiary contractions of the esophagus as can be seen with spasm. CXR 11/4 negative for acute changes.   Assessment / Plan / Recommendation Clinical Impression  Pt was seen for bedside swallow evaluation with her daughter present. Pt has a history of chronic dysphagia which pt's daughter reported has been becoming progressively worse. A dysphagia 3 diet with nectar thick liquids was most recently recommended by speech pathology. However, per the pt's daughter, the pt "won't drink" the thickened liquids. She stated that she has therefore been giving her thin liquids and buttermilk since its viscosity is higher. Per the  pt's daughter, the pt "gets strangled and choked on both solids and liquids" and it scares them both. Pt was unable to consistently follow 1-step commands and pt's daughter indicated that this combined with her increased memory difficulty have been concerning to her. A complete oral mechanism exam was not conducted due to difficulty with directions. She presented with maxillary dentures only.  She demonstrated signs of aspiration with thin liquids via straw and prolonged mastication was noted with dysphagia 3 solids. After a lengthy discussion with pt's daughter regarding diet options and pt/her desires, she expressed that she would like the pt to be happy and, considering her age, have some autonomy over her what she has p.o.. It was agreed that a dysphagia 2 diet with thin liquids via cup be initiated at this time. Pt's daughter indicated that the pt typically only uses straws but that the diet otherwise "sounds perfect". Considering the chronicity of pt's dysphagia, and her currently negative CXR, it was agreed that SLP will defer a repeat modified barium swallow study and first assess her tolerance of the diet when precautions are observed. SLP will follow pt.  SLP Visit Diagnosis: Dysphagia, unspecified (R13.10)    Aspiration Risk       Diet Recommendation Dysphagia 2 (Fine chop);Thin liquid   Liquid Administration via: Cup;No straw Medication Administration: Crushed with puree Supervision: Staff to assist with self feeding;Full supervision/cueing for compensatory strategies Compensations: Slow rate;Small sips/bites Postural Changes: Seated upright at 90 degrees    Other  Recommendations Oral Care Recommendations: Oral care BID   Follow up Recommendations None      Frequency and Duration min 2x/week  2 weeks       Prognosis Prognosis for Safe Diet Advancement: Guarded Barriers to Reach Goals: Time post onset;Cognitive deficits      Swallow Study   General Date of Onset: 01/27/15 HPI: Pt is an 86 y.o. female with medical history significant of anemia, COPD, depression, GERD/history of duodenal ulcer,  CVA, hypertension, hypothyroidism who presents with recent nausea and vomiting and was found to be in SVT by EMS. She received 1 dose of adenosine in the ED which converted her rhythm back to sinus. Per EMS, pt was seen by speech pathology in 2015 and 2016 and has chronic  dysphagia with coughing with p.o. intake. Most recent MBS on 01/27/15: mild oral and mild-moderated sensory based dysphagia with delayed swallow initiation to valleculae and pyrirform sinuses. Silent aspiration of thin liquids in pyriform sinuses prematurely. Therapeutic chin tuck was not effective over multiple trials. A dysphagia 3 diet with nectar thick liquids was recommended. Esophagram 01/27/15: narrowing at the esophagogastric junction which restricted passage of barium tablet concerning for a mild stricture. Mild GERD. Mild intermittent tertiary contractions of the esophagus as can be seen with spasm. CXR 11/4 negative for acute changes. Type of Study: Bedside Swallow Evaluation Previous Swallow Assessment: See HPI Diet Prior to this Study: NPO Temperature Spikes Noted: No Respiratory Status: Nasal cannula History of Recent Intubation: No Behavior/Cognition: Alert;Cooperative;Pleasant mood Oral Cavity Assessment: Within Functional Limits Oral Care Completed by SLP: No Oral Cavity - Dentition: Dentures, top;Edentulous Vision: Functional for self-feeding Self-Feeding Abilities: Needs assist Patient Positioning: Upright in bed;Postural control adequate for testing Baseline Vocal Quality: Normal Volitional Cough: Strong Volitional Swallow: Able to elicit    Oral/Motor/Sensory Function Overall Oral Motor/Sensory Function:  (Pt unable to participate)   Ice Chips Ice chips: Within functional limits Presentation: Spoon   Thin Liquid Thin Liquid: Impaired Presentation: Straw;Cup Pharyngeal  Phase Impairments: Throat Clearing - Immediate Other Comments: With straw not cup    Nectar Thick Nectar Thick Liquid: Not tested   Honey Thick Honey Thick Liquid: Not tested   Puree Puree: Within functional limits Presentation: Spoon   Solid     Solid: Impaired Oral Phase Impairments: Impaired mastication     Keshanna Riso I. Vear Clock, MS, CCC-SLP Acute Rehabilitation Services Office number  [phone] Pager [phone]  Scheryl Marten 07/11/2020,3:57 PM

## 2020-07-11 NOTE — Progress Notes (Signed)
   07/11/20 1559  Assess: MEWS Score  Temp 97.6 F (36.4 C)  BP (!) 156/73  Pulse Rate (!) 46  ECG Heart Rate (!) 48  Resp 12  Level of Consciousness Alert  SpO2 100 %  O2 Device Nasal Cannula  Patient Activity (if Appropriate) In bed  O2 Flow Rate (L/min) 2 L/min  Assess: MEWS Score  MEWS Temp 0  MEWS Systolic 0  MEWS Pulse 1  MEWS RR 1  MEWS LOC 0  MEWS Score 2  MEWS Score Color Yellow  Assess: if the MEWS score is Yellow or Red  Were vital signs taken at a resting state? Yes  Focused Assessment No change from prior assessment  Early Detection of Sepsis Score *See Row Information* Low  MEWS guidelines implemented *See Row Information* Yes  Treat  Pain Scale 0-10  Pain Score 0  Escalate  MEWS: Escalate Yellow: discuss with charge nurse/RN and consider discussing with provider and RRT  Notify: Charge Nurse/RN  Name of Charge Nurse/RN Notified Leary Roca RN   Date Charge Nurse/RN Notified 07/11/20  Time Charge Nurse/RN Notified 1604  Document  Patient Outcome Other (Comment)  Progress note created (see row info) Yes

## 2020-07-11 NOTE — ED Notes (Signed)
Pt cleaned of urinary incontinence and Purewick placed to suction. Daughter at bedside. Pt provided with warm blankets.

## 2020-07-11 NOTE — Progress Notes (Signed)
  Echocardiogram 2D Echocardiogram has been performed.  Augustine Radar 07/11/2020, 11:41 AM

## 2020-07-12 ENCOUNTER — Observation Stay (HOSPITAL_COMMUNITY): Payer: Medicare Other

## 2020-07-12 ENCOUNTER — Other Ambulatory Visit: Payer: Self-pay | Admitting: Physician Assistant

## 2020-07-12 DIAGNOSIS — I471 Supraventricular tachycardia: Secondary | ICD-10-CM

## 2020-07-12 DIAGNOSIS — R778 Other specified abnormalities of plasma proteins: Secondary | ICD-10-CM | POA: Diagnosis not present

## 2020-07-12 LAB — COMPREHENSIVE METABOLIC PANEL
ALT: 14 U/L (ref 0–44)
AST: 19 U/L (ref 15–41)
Albumin: 3 g/dL — ABNORMAL LOW (ref 3.5–5.0)
Alkaline Phosphatase: 70 U/L (ref 38–126)
Anion gap: 9 (ref 5–15)
BUN: 15 mg/dL (ref 8–23)
CO2: 25 mmol/L (ref 22–32)
Calcium: 9.7 mg/dL (ref 8.9–10.3)
Chloride: 105 mmol/L (ref 98–111)
Creatinine, Ser: 0.7 mg/dL (ref 0.44–1.00)
GFR, Estimated: >60 mL/min (ref >60)
Glucose, Bld: 97 mg/dL (ref 70–99)
Potassium: 4.2 mmol/L (ref 3.5–5.1)
Sodium: 139 mmol/L (ref 135–145)
Total Bilirubin: 0.6 mg/dL (ref 0.3–1.2)
Total Protein: 5.1 g/dL — ABNORMAL LOW (ref 6.5–8.1)

## 2020-07-12 LAB — OCCULT BLOOD X 1 CARD TO LAB, STOOL: Fecal Occult Bld: POSITIVE — AB

## 2020-07-12 LAB — MAGNESIUM: Magnesium: 1.9 mg/dL (ref 1.7–2.4)

## 2020-07-12 LAB — CBC WITH DIFFERENTIAL/PLATELET
Abs Immature Granulocytes: 0.02 10*3/uL (ref 0.00–0.07)
Basophils Absolute: 0.1 10*3/uL (ref 0.0–0.1)
Basophils Relative: 1 %
Eosinophils Absolute: 0.1 10*3/uL (ref 0.0–0.5)
Eosinophils Relative: 2 %
HCT: 38.3 % (ref 36.0–46.0)
Hemoglobin: 11.7 g/dL — ABNORMAL LOW (ref 12.0–15.0)
Immature Granulocytes: 0 %
Lymphocytes Relative: 23 %
Lymphs Abs: 1 10*3/uL (ref 0.7–4.0)
MCH: 27.8 pg (ref 26.0–34.0)
MCHC: 30.5 g/dL (ref 30.0–36.0)
MCV: 91 fL (ref 80.0–100.0)
Monocytes Absolute: 0.3 10*3/uL (ref 0.1–1.0)
Monocytes Relative: 7 %
Neutro Abs: 3 10*3/uL (ref 1.7–7.7)
Neutrophils Relative %: 67 %
Platelets: 161 10*3/uL (ref 150–400)
RBC: 4.21 MIL/uL (ref 3.87–5.11)
RDW: 13 % (ref 11.5–15.5)
WBC: 4.5 10*3/uL (ref 4.0–10.5)
nRBC: 0 % (ref 0.0–0.2)

## 2020-07-12 LAB — GLUCOSE, CAPILLARY: Glucose-Capillary: 151 mg/dL — ABNORMAL HIGH (ref 70–99)

## 2020-07-12 LAB — PHOSPHORUS: Phosphorus: 2.9 mg/dL (ref 2.5–4.6)

## 2020-07-12 MED ORDER — SUCRALFATE 1 G PO TABS
1.0000 g | ORAL_TABLET | Freq: Three times a day (TID) | ORAL | Status: DC
  Administered 2020-07-12 – 2020-07-14 (×7): 1 g via ORAL
  Filled 2020-07-12 (×7): qty 1

## 2020-07-12 MED ORDER — ONDANSETRON HCL 4 MG/2ML IJ SOLN: 4.0000 mg | Freq: Four times a day (QID) | INTRAMUSCULAR | Status: DC | PRN

## 2020-07-12 MED ORDER — PANTOPRAZOLE SODIUM 40 MG IV SOLR
40.0000 mg | Freq: Two times a day (BID) | INTRAVENOUS | Status: DC
  Administered 2020-07-12 – 2020-07-14 (×4): 40 mg via INTRAVENOUS
  Filled 2020-07-12 (×4): qty 40

## 2020-07-12 MED ORDER — PINDOLOL 5 MG PO TABS
2.5000 mg | ORAL_TABLET | Freq: Two times a day (BID) | ORAL | Status: DC
  Administered 2020-07-12 – 2020-07-14 (×5): 2.5 mg via ORAL
  Filled 2020-07-12 (×6): qty 1

## 2020-07-12 NOTE — Progress Notes (Signed)
PROGRESS NOTE    April Mccoy  TFT:[id number] DOB: [birth year] DOA: 07/10/2020  PCP: April Ravel, MD    Brief Narrative: April Mccoy is a 86 y.o. female with medical history significant of anemia, COPD, depression, GERD/history of duodenal ulcer, hypertension, hypothyroidism who presents with recent nausea and vomiting and was found to be in SVT by EMS.  History obtained with assistance of chart review and patient's daughter as she is a difficult historian.  She has had some episodes of nausea vomiting this morning that she felt better after vomiting. EMS was called and her heart rate is found to be in the 180s and apparent SVT.  She received 1 dose of adenosine in the ED which converted her rhythm back to sinus.  She denies chest pain, shortness of breath, abdominal pain, constipation or diarrhea when seen.  ED Course: Vitals in the ED significant for SVT initially which resolved with adenosine as above, otherwise vital signs stable.  Lab work showed normal BMP, normal magnesium, normal CBC.  LFTs showed mildly low protein and albumin.  Troponin up trended from 43-2 38.  Respiratory panel was negative.  UA and lipase pending.  Patient was seen by cardiology in the ED who recommended starting a beta-blocker and observing overnight.  Chest x-ray was without acute abnormality and CT abdomen showed chronic changes without acute abnormality.   Assessment & Plan: Principal Problem:   SVT (supraventricular tachycardia) (HCC): Pt has been started on responded well with metoprolol. TFT check. 07/12/20 Pindolol per Cardiology 2.5 bid.  And hold if HR is 55 and below.     GERD (gastroesophageal reflux disease) IV PPI. carafate and zofran. ?esophagitis. gastritis.     COPD (chronic obstructive pulmonary disease) (HCC) Stable cont duoneb as needed.    Elevated troponin Attribute to demand ischemia. Cardiology following.   Anemia: Hemoglobin has dropped from 12 to 10.2 We will  obtain iron panel c/w ACD and will transfuse as needed.  B12 def: Level 183 pt givne 1000 mcg of cyanocobalamin.   DVT prophylaxis: Heparin          Code Status: DNR Family Communication: None at bedside Disposition Plan: TBD Status is: Observation  The patient remains OBS appropriate and will d/c before 2 midnights.  Dispo: The patient is from: Home              Anticipated d/c is to: Home              Anticipated d/c date is: 2 days              Patient currently is not medically stable to d/c.  Consultants:  Cardiology Dr. Bjorn Mccoy.  Procedures:  None  Subjective: Pt is seen today she is asymptomatic and denies any complaints of chest pain or sob. Pt is alert and oriented.She came in SVT rhythm which I suspect was caused by underlying issues.   07/12/20 Pt is alert,awake and oriented to self and location .   Objective: Vitals:   07/12/20 0020 07/12/20 0426 07/12/20 0748 07/12/20 1100  BP: 132/62 (!) 154/70 (!) 146/72 138/68  Pulse: 65 88 67 77  Resp: 20  18 18   Temp: 98.2 F (36.8 C) 98.7 F (37.1 C) 98.8 F (37.1 C) 97.9 F (36.6 C)  TempSrc: Oral Oral Oral Oral  SpO2:  98% 98% 99%  Weight:  42.8 kg    Height:       SpO2: 99 % O2 Flow Rate (L/min): 2 L/min  Intake/Output Summary (Last 24 hours) at 07/12/2020 1651 Last data filed at 07/11/2020 2246 Gross per 24 hour  Intake 3 ml  Output --  Net 3 ml   Filed Weights   07/12/20 0426  Weight: 42.8 kg    Examination: Blood pressure 138/68, pulse 77, temperature 97.9 F (36.6 C), temperature source Oral, resp. rate 18, height 4\' 11"  (1.499 m), weight 42.8 kg, SpO2 99 %. General exam: Appears frail and thin.  HEENT:EOMI, perrl  Respiratory system: Clear to auscultation. Respiratory effort normal. Cardiovascular system: S1 & S2 heard, RRR. No JVD, + in apt area.murmurs, rubs, gallops or clicks. No pedal edema. Gastrointestinal system: Abdomen is nondistended, soft and nontender. No organomegaly or  masses felt. Normal bowel sounds heard. Central nervous system: Alert and oriented.CN grossly intact No focal neurological deficits. Extremities: pt moving all 4 ext and ambulating. Skin: No rashes, lesions or ulcers Psychiatry: Judgement and insight appear normal. Mood & affect appropriate.   Data Reviewed: I have personally reviewed following labs and imaging studies  I/O last 3 completed shifts: In: 3 [I.V.:3] Out: -  No intake/output data recorded. Lab Results  Component Value Date   CREATININE 0.70 07/12/2020   CREATININE 0.68 07/11/2020   CREATININE 0.92 07/10/2020   CBC: Recent Labs  Lab 07/10/20 1248 07/11/20 0223 07/12/20 0926  WBC 5.9 4.4 4.5  NEUTROABS 3.9  --  3.0  HGB 12.0 10.2* 11.7*  HCT 40.0 34.5* 38.3  MCV 93.7 93.0 91.0  PLT 200 158 161   Basic Metabolic Panel: Recent Labs  Lab 07/10/20 1248 07/11/20 0223 07/12/20 0058 07/12/20 0926  NA 141 141  --  139  K 3.9 3.9  --  4.2  CL 108 108  --  105  CO2 26 25  --  25  GLUCOSE 136* 80  --  97  BUN 20 17  --  15  CREATININE 0.92 0.68  --  0.70  CALCIUM 10.2 9.6  --  9.7  MG 2.1  --  1.9  --   PHOS  --   --  2.9  --    GFR: Estimated Creatinine Clearance: 32.2 mL/min (by C-G formula based on SCr of 0.7 mg/dL). Liver Function Tests: Recent Labs  Lab 07/10/20 1248 07/11/20 0223 07/12/20 0926  AST 21 21 19   ALT 15 16 14   ALKPHOS 80 66 70  BILITOT 0.7 0.7 0.6  PROT 5.7* 4.8* 5.1*  ALBUMIN 3.2* 2.9* 3.0*   Recent Labs  Lab 07/10/20 1248  LIPASE 35   No results for input(s): AMMONIA in the last 168 hours.  Coagulation Profile: No results for input(s): INR, PROTIME in the last 168 hours. Cardiac Enzymes: No results for input(s): CKTOTAL, CKMB, CKMBINDEX, TROPONINI in the last 168 hours. BNP (last 3 results) No results for input(s): PROBNP in the last 8760 hours. HbA1C: No results for input(s): HGBA1C in the last 72 hours. CBG: Recent Labs  Lab 07/12/20 1643  GLUCAP 151*   Lipid  Profile: No results for input(s): CHOL, HDL, LDLCALC, TRIG, CHOLHDL, LDLDIRECT in the last 72 hours. Thyroid Function Tests: Recent Labs    07/11/20 0828  TSH 1.329  FREET4 0.91   Anemia Panel: Recent Labs    07/11/20 0828  VITAMINB12 183  FOLATE 17.2  FERRITIN 22  TIBC 382  IRON 79  RETICCTPCT 0.9   Sepsis Labs: No results for input(s): PROCALCITON, LATICACIDVEN in the last 168 hours.  Recent Results (from the past 240 hour(s))  Respiratory Panel  by RT PCR (Flu A&B, Covid) - Nasopharyngeal Swab     Status: None   Collection Time: 07/10/20  3:56 PM   Specimen: Nasopharyngeal Swab  Result Value Ref Range Status   SARS Coronavirus 2 by RT PCR NEGATIVE NEGATIVE Final    Comment: (NOTE) SARS-CoV-2 target nucleic acids are NOT DETECTED.  The SARS-CoV-2 RNA is generally detectable in upper respiratoy specimens during the acute phase of infection. The lowest concentration of SARS-CoV-2 viral copies this assay can detect is 131 copies/mL. A negative result does not preclude SARS-Cov-2 infection and should not be used as the sole basis for treatment or other patient management decisions. A negative result may occur with  improper specimen collection/handling, submission of specimen other than nasopharyngeal swab, presence of viral mutation(s) within the areas targeted by this assay, and inadequate number of viral copies (<131 copies/mL). A negative result must be combined with clinical observations, patient history, and epidemiological information. The expected result is Negative.  Fact Sheet for Patients:  https://www.moore.com/  Fact Sheet for Healthcare Providers:  https://www.young.biz/  This test is no t yet approved or cleared by the [place] FDA and  has been authorized for detection and/or diagnosis of SARS-CoV-2 by FDA under an Emergency Use Authorization (EUA). This EUA will remain  in effect (meaning this test can be  used) for the duration of the COVID-19 declaration under Section 564(b)(1) of the Act, 21 U.S.C. section 360bbb-3(b)(1), unless the authorization is terminated or revoked sooner.     Influenza A by PCR NEGATIVE NEGATIVE Final   Influenza B by PCR NEGATIVE NEGATIVE Final    Comment: (NOTE) The Xpert Xpress SARS-CoV-2/FLU/RSV assay is intended as an aid in  the diagnosis of influenza from Nasopharyngeal swab specimens and  should not be used as a sole basis for treatment. Nasal washings and  aspirates are unacceptable for Xpert Xpress SARS-CoV-2/FLU/RSV  testing.  Fact Sheet for Patients: https://www.moore.com/  Fact Sheet for Healthcare Providers: https://www.young.biz/  This test is not yet approved or cleared by the [place] FDA and  has been authorized for detection and/or diagnosis of SARS-CoV-2 by  FDA under an Emergency Use Authorization (EUA). This EUA will remain  in effect (meaning this test can be used) for the duration of the  Covid-19 declaration under Section 564(b)(1) of the Act, 21  U.S.C. section 360bbb-3(b)(1), unless the authorization is  terminated or revoked. Performed at [facility], [address], [place], [place] [address]       Radiology Studies: CT ABDOMEN PELVIS W CONTRAST  Result Date: 07/10/2020 CLINICAL DATA:  Nausea and vomiting EXAM: CT ABDOMEN AND PELVIS WITH CONTRAST TECHNIQUE: Multidetector CT imaging of the abdomen and pelvis was performed using the standard protocol following bolus administration of intravenous contrast. CONTRAST:  28mL OMNIPAQUE IOHEXOL 300 MG/ML  SOLN COMPARISON:  None. FINDINGS: Lower chest: Lung bases demonstrate some mild peribronchial thickening as well as mild left basilar atelectasis. Hepatobiliary: Fatty infiltration of the liver is noted. The gallbladder appears within normal limits. Pancreas: Unremarkable. No pancreatic ductal dilatation or surrounding inflammatory  changes. Spleen: Normal in size without focal abnormality. Adrenals/Urinary Tract: Adrenal glands are within normal limits. Kidneys demonstrate a normal enhancement pattern bilaterally. Small lower pole right renal cyst and left upper pole renal cyst are noted. No calculi are seen. No obstructive changes are noted. The bladder is partially distended. Stomach/Bowel: No obstructive or inflammatory changes of the colon are noted. The appendix is not well visualized although no inflammatory  changes are seen to suggest appendicitis. Small bowel and stomach appear within normal limits. Vascular/Lymphatic: Atherosclerotic calcifications of the abdominal aorta are noted. No aneurysmal dilatation is seen. No significant lymphadenopathy is noted. Reproductive: Status post hysterectomy. No adnexal masses. Other: No abdominal wall hernia or abnormality. No abdominopelvic ascites. Musculoskeletal: Postsurgical changes in the proximal femurs are noted bilaterally. Generalized osteopenia is seen. Compression deformities are noted at L2, L3 and L4 as well as T11 and T12. These are stable from a prior plain film examination from February of 2020 IMPRESSION: Mild left basilar atelectasis. Bilateral renal cysts. Chronic compression deformities in the lumbar spine and thoracic spine Aortic Atherosclerosis (ICD10-I70.0). Electronically Signed   By: Alcide Clever M.D.   On: 07/10/2020 17:17   DG Chest Portable 1 View  Result Date: 07/10/2020 CLINICAL DATA:  Supraventricular tachycardia. EXAM: PORTABLE CHEST 1 VIEW COMPARISON:  April 22, 2020. FINDINGS: Stable cardiomediastinal silhouette. No pneumothorax or pleural effusion is noted. Both lungs are clear. The visualized skeletal structures are unremarkable. IMPRESSION: No active disease. Aortic Atherosclerosis (ICD10-I70.0). Electronically Signed   By: Lupita Raider M.D.   On: 07/10/2020 13:26   ECHOCARDIOGRAM COMPLETE  Result Date: 07/11/2020    ECHOCARDIOGRAM REPORT   Patient  Name:   April Mccoy Date of Exam: 07/11/2020 Medical Rec #:  [id number]       Height:       59.0 in Accession #:    [id number]      Weight:       87.1 lb Date of Birth:  [birth year]        BSA:          1.295 m Patient Age:    89 years        BP:           153/75 mmHg Patient Gender: F               HR:           55 bpm. Exam Location:  Inpatient Procedure: 2D Echo, Color Doppler and Cardiac Doppler Indications:    R94.31 Abnormal EKG  History:        Patient has no prior history of Echocardiogram examinations.                 Stroke and COPD, Signs/Symptoms:Shortness of Breath and Dyspnea;                 Risk Factors:Current Smoker and Hypertension.  Sonographer:    Eulah Pont RDCS Referring Phys: [id number] CHRISTOPHER L SCHUMANN IMPRESSIONS  1. Left ventricular ejection fraction, by estimation, is 55 to 60%. The left ventricle has normal function. The left ventricle has no regional wall motion abnormalities. Left ventricular diastolic parameters are consistent with Grade II diastolic dysfunction (pseudonormalization).  2. Right ventricular systolic function is normal. The right ventricular size is normal. There is mildly elevated pulmonary artery systolic pressure.  3. Left atrial size was mildly dilated.  4. Mild mitral valve regurgitation.  5. Tricuspid valve regurgitation is mild to moderate.  6. The aortic valve is abnormal. Aortic valve regurgitation is mild to moderate. Mild aortic valve sclerosis is present, with no evidence of aortic valve stenosis.  7. The inferior vena cava is normal in size with greater than 50% respiratory variability, suggesting right atrial pressure of 3 mmHg. FINDINGS  Left Ventricle: Left ventricular ejection fraction, by estimation, is 55 to 60%. The left ventricle has normal function. The left ventricle has no regional  wall motion abnormalities. The left ventricular internal cavity size was normal in size. There is  no left ventricular hypertrophy. Left ventricular diastolic  parameters are consistent with Grade II diastolic dysfunction (pseudonormalization). Right Ventricle: The right ventricular size is normal. No increase in right ventricular wall thickness. Right ventricular systolic function is normal. There is mildly elevated pulmonary artery systolic pressure. The tricuspid regurgitant velocity is 2.96  m/s, and with an assumed right atrial pressure of 3 mmHg, the estimated right ventricular systolic pressure is 38.0 mmHg. Left Atrium: Left atrial size was mildly dilated. Right Atrium: Right atrial size was normal in size. Pericardium: There is no evidence of pericardial effusion. Mitral Valve: The mitral valve is abnormal. There is mild thickening of the mitral valve leaflet(s). Mild mitral valve regurgitation. Tricuspid Valve: The tricuspid valve is normal in structure. Tricuspid valve regurgitation is mild to moderate. Aortic Valve: The aortic valve is abnormal. Aortic valve regurgitation is mild to moderate. Aortic regurgitation PHT measures 727 msec. Mild aortic valve sclerosis is present, with no evidence of aortic valve stenosis. Pulmonic Valve: The pulmonic valve was not well visualized. Pulmonic valve regurgitation is mild. Aorta: The aortic root is normal in size and structure. Venous: The inferior vena cava is normal in size with greater than 50% respiratory variability, suggesting right atrial pressure of 3 mmHg. IAS/Shunts: No atrial level shunt detected by color flow Doppler.  LEFT VENTRICLE PLAX 2D LVIDd:         3.70 cm  Diastology LVIDs:         2.90 cm  LV e' medial:    4.58 cm/s LV PW:         0.80 cm  LV E/e' medial:  17.7 LV IVS:        0.80 cm  LV e' lateral:   5.10 cm/s LVOT diam:     1.80 cm  LV E/e' lateral: 15.9 LV SV:         62 LV SV Index:   48 LVOT Area:     2.54 cm  RIGHT VENTRICLE RV S prime:     7.29 cm/s TAPSE (M-mode): 1.4 cm LEFT ATRIUM           Index       RIGHT ATRIUM           Index LA diam:      2.90 cm 2.24 cm/m  RA Area:     11.20 cm  LA Vol (A2C): 51.1 ml 39.45 ml/m RA Volume:   27.00 ml  20.84 ml/m LA Vol (A4C): 32.7 ml 25.24 ml/m  AORTIC VALVE LVOT Vmax:   76.50 cm/s LVOT Vmean:  52.400 cm/s LVOT VTI:    0.245 m AI PHT:      727 msec  AORTA Ao Root diam: 3.30 cm MITRAL VALVE               TRICUSPID VALVE MV Area (PHT): 2.87 cm    TR Peak grad:   35.0 mmHg MV Decel Time: 264 msec    TR Vmax:        296.00 cm/s MV E velocity: 81.00 cm/s MV A velocity: 73.20 cm/s  SHUNTS MV E/A ratio:  1.11        Systemic VTI:  0.24 m                            Systemic Diam: 1.80 cm Dietrich Pates MD Electronically signed by Gunnar Fusi  Tenny Craw MD Signature Date/Time: 07/11/2020/3:24:21 PM    Final       Current Facility-Administered Medications (Endocrine & Metabolic):  .  levothyroxine (SYNTHROID) tablet 50 mcg  Current Outpatient Medications (Endocrine & Metabolic):  .  levothyroxine (SYNTHROID) 50 MCG tablet, Take 50 mcg by mouth daily.  Current Facility-Administered Medications (Cardiovascular):  .  benazepril (LOTENSIN) tablet 10 mg .  metoprolol succinate (TOPROL-XL) 24 hr tablet 12.5 mg  Current Outpatient Medications (Cardiovascular):  .  benazepril (LOTENSIN) 10 MG tablet, Take 10 mg by mouth daily.   Current Facility-Administered Medications (Respiratory):  .  ipratropium-albuterol (DUONEB) 0.5-2.5 (3) MG/3ML nebulizer solution 3 mL  Current Outpatient Medications (Respiratory):  .  albuterol (PROVENTIL) (2.5 MG/3ML) 0.083% nebulizer solution, Take 3 mLs (2.5 mg total) by nebulization every 6 (six) hours as needed for wheezing or shortness of breath. Marland Kitchen  ipratropium-albuterol (DUONEB) 0.5-2.5 (3) MG/3ML SOLN, Take 3 mLs by nebulization 2 (two) times daily as needed (for breathing).   Current Facility-Administered Medications (Analgesics):  .  acetaminophen (TYLENOL) tablet 650 mg **OR** acetaminophen (TYLENOL) suppository 650 mg  .  aspirin EC tablet 81 mg  Current Outpatient Medications (Analgesics):  .  acetaminophen (TYLENOL)  325 MG tablet, Take 325 mg by mouth every 6 (six) hours as needed for moderate pain. Marland Kitchen  aspirin EC 81 MG tablet, Take 1 tablet (81 mg total) by mouth daily.  Current Facility-Administered Medications (Hematological):  .  ferrous sulfate tablet 325 mg .  heparin injection 5,000 Units  Current Outpatient Medications (Hematological):  .  ferrous sulfate 325 (65 FE) MG tablet, Take 325 mg by mouth daily with breakfast.  Current Facility-Administered Medications (Other):  .  calcium carbonate (OS-CAL - dosed in mg of elemental calcium) tablet 500 mg of elemental calcium .  escitalopram (LEXAPRO) tablet 10 mg .  liver oil-zinc oxide (DESITIN) 40 % ointment 1 application .  pantoprazole (PROTONIX) EC tablet 40 mg .  sodium chloride flush (NS) 0.9 % injection 3 mL  Current Outpatient Medications (Other):  .  calcium carbonate (OS-CAL - DOSED IN MG OF ELEMENTAL CALCIUM) 1250 MG tablet, Take 1 tablet (500 mg of elemental calcium total) by mouth 2 (two) times daily with a meal. .  escitalopram (LEXAPRO) 10 MG tablet, Take 10 mg by mouth daily.  .  ondansetron (ZOFRAN) 4 MG tablet, Take 4 mg by mouth every 8 (eight) hours as needed for nausea or vomiting.  .  pantoprazole (PROTONIX) 40 MG tablet, Take 40 mg by mouth daily. Marland Kitchen  Propylene Glycol (SYSTANE BALANCE) 0.6 % SOLN, Apply 1 drop to eye daily as needed (for dry eyes).  .  protective barrier (RESTORE) CREA, Apply 1 application topically every other day.   Anti-infectives (From admission, onward)   None      Scheduled Meds: . aspirin EC  81 mg Oral Daily  . benazepril  10 mg Oral Daily  . calcium carbonate  1 tablet Oral BID WC  . escitalopram  10 mg Oral Daily  . ferrous sulfate  325 mg Oral Daily  . heparin  5,000 Units Subcutaneous Q8H  . levothyroxine  50 mcg Oral Daily  . liver oil-zinc oxide  1 application Topical QODAY  . pantoprazole (PROTONIX) IV  40 mg Intravenous Q12H  . pindolol  2.5 mg Oral BID  . sodium chloride flush  3  mL Intravenous Q12H  . sucralfate  1 g Oral TID WC & HS   Continuous Infusions:   LOS: 0 days  Gertha Calkin, MD [facility] Pager [phone] If 7PM-7AM, please contact night-coverage [email or web address] Password [facility] 07/12/2020, 4:51 PM

## 2020-07-12 NOTE — Progress Notes (Addendum)
Editing prior note:  Original plan per Dr. Diona Browner was to order outpatient 72hr Zio with 1-2 week follow-up. On the weekends Zios are requested to be mailed. We do not have capability to place prior to DC (only available M-F). Dr. Allena Katz with hospitalist team relayed that family was not comfortable taking her home today since she lives alone and wanted to remain inpatient until monitor could be placed. I discussed plan with Dr. Diona Browner then because this would essentially function as the "monitoring" we would be using Zio for in the first place. Dr. Diona Browner suggested making use of this time and trying a trial of pindolol 2.5mg  BID (hold for HR <55) while on telemetry and if she tolerates this, can discharge home on Monday still with plan for a monitor which can be arranged by next week's inpatient team prior to discharge. Plan relayed to IM and patient's daughter. I retracted my outpatient monitor request from our office. Also sent message to cardiology cardmaster to relay to Monday rounding team so that they know to f/u and arrange monitor.   Will also defer arrangement of home health needs to primary team so that patient is set up for discharge when medically ready.  Onis Markoff PA-C

## 2020-07-12 NOTE — Progress Notes (Signed)
  Speech Language Pathology Treatment: Dysphagia  Patient Details Name: April Mccoy MRN: [id number] DOB: 07/13/1929 Today's Date: 07/12/2020 Time: 1000-1015 SLP Time Calculation (min) (ACUTE ONLY): 15 min  Assessment / Plan / Recommendation Clinical Impression  Patient seen for dysphagia treatment. She was alert, pleasant and told SLP that she was going home tomorrow (per RN, she may discharge today or tomorrow). Patient requested a coke and when SLP brought into room without straw, she said, "I've got a straw right here" SLP who completed initial evaluation recommended no straws. Patient observed with straw sips of thin liquids and after one brief cough after first sip, she did not exhibit any coughing with subsequent straw sips of thin liquids. Based on past MBS (remote, in 2016), recommendation is for no straws, however it will be up to patient and her daughter regarding her wishes. No f/u recommended for SLP beyond acute care stay.   HPI HPI: Pt is an 86 y.o. female with medical history significant of anemia, COPD, depression, GERD/history of duodenal ulcer,  CVA, hypertension, hypothyroidism who presents with recent nausea and vomiting and was found to be in SVT by EMS. She received 1 dose of adenosine in the ED which converted her rhythm back to sinus. Per EMS, pt was seen by speech pathology in 2015 and 2016 and has chronic dysphagia with coughing with p.o. intake. Most recent MBS on 01/27/15: mild oral and mild-moderated sensory based dysphagia with delayed swallow initiation to valleculae and pyrirform sinuses. Silent aspiration of thin liquids in pyriform sinuses prematurely. Therapeutic chin tuck was not effective over multiple trials. A dysphagia 3 diet with nectar thick liquids was recommended. Esophagram 01/27/15: narrowing at the esophagogastric junction which restricted passage of barium tablet concerning for a mild stricture. Mild GERD. Mild intermittent tertiary contractions of the  esophagus as can be seen with spasm. CXR 11/4 negative for acute changes.      SLP Plan  Continue with current plan of care       Recommendations  Diet recommendations: Thin liquid;Dysphagia 2 (fine chop) Liquids provided via: Cup;No straw (recommendation is for no straw) Medication Administration: Crushed with puree Supervision: Patient able to self feed;Intermittent supervision to cue for compensatory strategies Compensations: Slow rate;Small sips/bites Postural Changes and/or Swallow Maneuvers: Seated upright 90 degrees                Follow up Recommendations: None SLP Visit Diagnosis: Dysphagia, unspecified (R13.10) Plan: Continue with current plan of care       GO              Angela Nevin, MA, CCC-SLP Speech Therapy [facility]

## 2020-07-12 NOTE — Progress Notes (Signed)
Progress Note  Patient Name: April Mccoy Date of Encounter: 07/12/2020  Primary Cardiologist: Little Ishikawa, MD  Subjective   No specific complaints.  Having some coffee with a small breakfast.  No reported chest pain.  Inpatient Medications    Scheduled Meds: . aspirin EC  81 mg Oral Daily  . benazepril  10 mg Oral Daily  . calcium carbonate  1 tablet Oral BID WC  . escitalopram  10 mg Oral Daily  . ferrous sulfate  325 mg Oral Daily  . heparin  5,000 Units Subcutaneous Q8H  . levothyroxine  50 mcg Oral Daily  . liver oil-zinc oxide  1 application Topical QODAY  . pantoprazole  40 mg Oral Daily  . sodium chloride flush  3 mL Intravenous Q12H    PRN Meds: acetaminophen **OR** acetaminophen, ipratropium-albuterol   Vital Signs    Vitals:   07/11/20 2007 07/12/20 0020 07/12/20 0426 07/12/20 0748  BP: 103/86 132/62 (!) 154/70 (!) 146/72  Pulse: (!) 58 65 88 67  Resp: 20 20  18   Temp: 97.6 F (36.4 C) 98.2 F (36.8 C) 98.7 F (37.1 C) 98.8 F (37.1 C)  TempSrc: Oral Oral Oral Oral  SpO2:   98% 98%  Weight:   42.8 kg   Height:        Intake/Output Summary (Last 24 hours) at 07/12/2020 0904 Last data filed at 07/11/2020 2246 Gross per 24 hour  Intake 3 ml  Output --  Net 3 ml   Filed Weights   07/12/20 0426  Weight: 42.8 kg    Telemetry    Sinus rhythm, rare PVC.  No further SVT.  Personally reviewed.  ECG    An ECG dated 07/10/2020 was personally reviewed today and demonstrated:  Sinus rhythm with prolonged PR interval, nonspecific ST changes.  Physical Exam   GEN:  Frail-appearing elderly woman, no acute distress.   Neck: No JVD. Cardiac: RRR, soft systolic murmur.  Respiratory: Nonlabored. Clear to auscultation bilaterally. GI: Soft, nontender, bowel sounds present. MS:  Kyphosis noted.  No edema.  Labs    Chemistry Recent Labs  Lab 07/10/20 1248 07/11/20 0223  NA 141 141  K 3.9 3.9  CL 108 108  CO2 26 25  GLUCOSE 136*  80  BUN 20 17  CREATININE 0.92 0.68  CALCIUM 10.2 9.6  PROT 5.7* 4.8*  ALBUMIN 3.2* 2.9*  AST 21 21  ALT 15 16  ALKPHOS 80 66  BILITOT 0.7 0.7  GFRNONAA 60* >60  ANIONGAP 7 8     Hematology Recent Labs  Lab 07/10/20 1248 07/11/20 0223 07/11/20 0828  WBC 5.9 4.4  --   RBC 4.27 3.71* 4.45  HGB 12.0 10.2*  --   HCT 40.0 34.5*  --   MCV 93.7 93.0  --   MCH 28.1 27.5  --   MCHC 30.0 29.6*  --   RDW 13.0 13.0  --   PLT 200 158  --     Cardiac Enzymes Recent Labs  Lab 07/10/20 2204 07/11/20 0223 07/11/20 0828 07/11/20 0959 07/11/20 1216  TROPONINIHS 576* 565* 449* 396* 371*    Radiology    CT ABDOMEN PELVIS W CONTRAST  Result Date: 07/10/2020 CLINICAL DATA:  Nausea and vomiting EXAM: CT ABDOMEN AND PELVIS WITH CONTRAST TECHNIQUE: Multidetector CT imaging of the abdomen and pelvis was performed using the standard protocol following bolus administration of intravenous contrast. CONTRAST:  57mL OMNIPAQUE IOHEXOL 300 MG/ML  SOLN COMPARISON:  None. FINDINGS:  Lower chest: Lung bases demonstrate some mild peribronchial thickening as well as mild left basilar atelectasis. Hepatobiliary: Fatty infiltration of the liver is noted. The gallbladder appears within normal limits. Pancreas: Unremarkable. No pancreatic ductal dilatation or surrounding inflammatory changes. Spleen: Normal in size without focal abnormality. Adrenals/Urinary Tract: Adrenal glands are within normal limits. Kidneys demonstrate a normal enhancement pattern bilaterally. Small lower pole right renal cyst and left upper pole renal cyst are noted. No calculi are seen. No obstructive changes are noted. The bladder is partially distended. Stomach/Bowel: No obstructive or inflammatory changes of the colon are noted. The appendix is not well visualized although no inflammatory changes are seen to suggest appendicitis. Small bowel and stomach appear within normal limits. Vascular/Lymphatic: Atherosclerotic calcifications of  the abdominal aorta are noted. No aneurysmal dilatation is seen. No significant lymphadenopathy is noted. Reproductive: Status post hysterectomy. No adnexal masses. Other: No abdominal wall hernia or abnormality. No abdominopelvic ascites. Musculoskeletal: Postsurgical changes in the proximal femurs are noted bilaterally. Generalized osteopenia is seen. Compression deformities are noted at L2, L3 and L4 as well as T11 and T12. These are stable from a prior plain film examination from February of 2020 IMPRESSION: Mild left basilar atelectasis. Bilateral renal cysts. Chronic compression deformities in the lumbar spine and thoracic spine Aortic Atherosclerosis (ICD10-I70.0). Electronically Signed   By: Alcide Clever M.D.   On: 07/10/2020 17:17   DG Chest Portable 1 View  Result Date: 07/10/2020 CLINICAL DATA:  Supraventricular tachycardia. EXAM: PORTABLE CHEST 1 VIEW COMPARISON:  April 22, 2020. FINDINGS: Stable cardiomediastinal silhouette. No pneumothorax or pleural effusion is noted. Both lungs are clear. The visualized skeletal structures are unremarkable. IMPRESSION: No active disease. Aortic Atherosclerosis (ICD10-I70.0). Electronically Signed   By: Lupita Raider M.D.   On: 07/10/2020 13:26   ECHOCARDIOGRAM COMPLETE  Result Date: 07/11/2020    ECHOCARDIOGRAM REPORT   Patient Name:   April Mccoy Date of Exam: 07/11/2020 Medical Rec #:  [id number]       Height:       59.0 in Accession #:    [id number]      Weight:       87.1 lb Date of Birth:  August 24, 1929        BSA:          1.295 m Patient Age:    86 years        BP:           153/75 mmHg Patient Gender: F               HR:           55 bpm. Exam Location:  Inpatient Procedure: 2D Echo, Color Doppler and Cardiac Doppler Indications:    R94.31 Abnormal EKG  History:        Patient has no prior history of Echocardiogram examinations.                 Stroke and COPD, Signs/Symptoms:Shortness of Breath and Dyspnea;                 Risk Factors:Current  Smoker and Hypertension.  Sonographer:    Eulah Pont RDCS Referring Phys: [id number] CHRISTOPHER L SCHUMANN IMPRESSIONS  1. Left ventricular ejection fraction, by estimation, is 55 to 60%. The left ventricle has normal function. The left ventricle has no regional wall motion abnormalities. Left ventricular diastolic parameters are consistent with Grade II diastolic dysfunction (pseudonormalization).  2. Right ventricular systolic function is normal. The right  ventricular size is normal. There is mildly elevated pulmonary artery systolic pressure.  3. Left atrial size was mildly dilated.  4. Mild mitral valve regurgitation.  5. Tricuspid valve regurgitation is mild to moderate.  6. The aortic valve is abnormal. Aortic valve regurgitation is mild to moderate. Mild aortic valve sclerosis is present, with no evidence of aortic valve stenosis.  7. The inferior vena cava is normal in size with greater than 50% respiratory variability, suggesting right atrial pressure of 3 mmHg. FINDINGS  Left Ventricle: Left ventricular ejection fraction, by estimation, is 55 to 60%. The left ventricle has normal function. The left ventricle has no regional wall motion abnormalities. The left ventricular internal cavity size was normal in size. There is  no left ventricular hypertrophy. Left ventricular diastolic parameters are consistent with Grade II diastolic dysfunction (pseudonormalization). Right Ventricle: The right ventricular size is normal. No increase in right ventricular wall thickness. Right ventricular systolic function is normal. There is mildly elevated pulmonary artery systolic pressure. The tricuspid regurgitant velocity is 2.96  m/s, and with an assumed right atrial pressure of 3 mmHg, the estimated right ventricular systolic pressure is 38.0 mmHg. Left Atrium: Left atrial size was mildly dilated. Right Atrium: Right atrial size was normal in size. Pericardium: There is no evidence of pericardial effusion. Mitral  Valve: The mitral valve is abnormal. There is mild thickening of the mitral valve leaflet(s). Mild mitral valve regurgitation. Tricuspid Valve: The tricuspid valve is normal in structure. Tricuspid valve regurgitation is mild to moderate. Aortic Valve: The aortic valve is abnormal. Aortic valve regurgitation is mild to moderate. Aortic regurgitation PHT measures 727 msec. Mild aortic valve sclerosis is present, with no evidence of aortic valve stenosis. Pulmonic Valve: The pulmonic valve was not well visualized. Pulmonic valve regurgitation is mild. Aorta: The aortic root is normal in size and structure. Venous: The inferior vena cava is normal in size with greater than 50% respiratory variability, suggesting right atrial pressure of 3 mmHg. IAS/Shunts: No atrial level shunt detected by color flow Doppler.  LEFT VENTRICLE PLAX 2D LVIDd:         3.70 cm  Diastology LVIDs:         2.90 cm  LV e' medial:    4.58 cm/s LV PW:         0.80 cm  LV E/e' medial:  17.7 LV IVS:        0.80 cm  LV e' lateral:   5.10 cm/s LVOT diam:     1.80 cm  LV E/e' lateral: 15.9 LV SV:         62 LV SV Index:   48 LVOT Area:     2.54 cm  RIGHT VENTRICLE RV S prime:     7.29 cm/s TAPSE (M-mode): 1.4 cm LEFT ATRIUM           Index       RIGHT ATRIUM           Index LA diam:      2.90 cm 2.24 cm/m  RA Area:     11.20 cm LA Vol (A2C): 51.1 ml 39.45 ml/m RA Volume:   27.00 ml  20.84 ml/m LA Vol (A4C): 32.7 ml 25.24 ml/m  AORTIC VALVE LVOT Vmax:   76.50 cm/s LVOT Vmean:  52.400 cm/s LVOT VTI:    0.245 m AI PHT:      727 msec  AORTA Ao Root diam: 3.30 cm MITRAL VALVE  TRICUSPID VALVE MV Area (PHT): 2.87 cm    TR Peak grad:   35.0 mmHg MV Decel Time: 264 msec    TR Vmax:        296.00 cm/s MV E velocity: 81.00 cm/s MV A velocity: 73.20 cm/s  SHUNTS MV E/A ratio:  1.11        Systemic VTI:  0.24 m                            Systemic Diam: 1.80 cm Dietrich Pates MD Electronically signed by Dietrich Pates MD Signature Date/Time:  07/11/2020/3:24:21 PM    Final     Patient Profile     86 y.o. female with a history of COPD, depression, GERD and duodenal ulcer anemia, hypertension, and hypothyroidism now presenting with newly documented SVT requiring adenosine.  Assessment & Plan    1.  SVT, successfully treated with adenosine.  Patient initially started on Toprol-XL 12.5 mg daily however this was discontinued due to bradycardia with heart rates in the 40s and 50s.  She is asymptomatic at this time and has had no further arrhythmias by telemetry.  Echocardiogram demonstrates normal LVEF at 55 to 60%.  2.  Mild high-sensitivity troponin I elevation and relatively flat pattern, peak of 576.  Most consistent with demand ischemia rather than ACS.  She does not report any chest pain.  Echocardiogram does not show any wall motion abnormalities.  No further inpatient work-up anticipated from a cardiac perspective.  I reviewed Dr. Campbell Lerner note and recommendations for cardiac monitor. Would not discharge on standing beta-blocker at this time, she might be able to be considered for either as needed use of propranolol or possibly even standing dose of pindolol.  We will obtain a 72-hour Zio patch mainly to better assess heart rate variability in sinus rhythm and determine treatment course.  Not necessarily in need of any longer-term monitoring since SVT has already been documented.  We will arrange follow-up with Dr. Bjorn Pippin or APP in the next 1-2 weeks.  Signed, Nona Dell, MD  07/12/2020, 9:04 AM

## 2020-07-12 NOTE — Progress Notes (Signed)
Dr. Allena Katz text paged via AMION. Had c/o  Dizziness, sick on her stomach only. CBG = 151; BP 138/82, HR 63; and O2 saturation at 94 % on Room Air. Pt was laying in bed when she had her complaint .

## 2020-07-13 ENCOUNTER — Observation Stay (HOSPITAL_COMMUNITY): Payer: Medicare Other

## 2020-07-13 DIAGNOSIS — Z90710 Acquired absence of both cervix and uterus: Secondary | ICD-10-CM | POA: Diagnosis not present

## 2020-07-13 DIAGNOSIS — R112 Nausea with vomiting, unspecified: Secondary | ICD-10-CM | POA: Diagnosis present

## 2020-07-13 DIAGNOSIS — I959 Hypotension, unspecified: Secondary | ICD-10-CM | POA: Diagnosis present

## 2020-07-13 DIAGNOSIS — K922 Gastrointestinal hemorrhage, unspecified: Secondary | ICD-10-CM | POA: Diagnosis present

## 2020-07-13 DIAGNOSIS — D509 Iron deficiency anemia, unspecified: Secondary | ICD-10-CM | POA: Diagnosis present

## 2020-07-13 DIAGNOSIS — F32A Depression, unspecified: Secondary | ICD-10-CM | POA: Diagnosis present

## 2020-07-13 DIAGNOSIS — I10 Essential (primary) hypertension: Secondary | ICD-10-CM | POA: Diagnosis present

## 2020-07-13 DIAGNOSIS — Z66 Do not resuscitate: Secondary | ICD-10-CM | POA: Diagnosis present

## 2020-07-13 DIAGNOSIS — H9190 Unspecified hearing loss, unspecified ear: Secondary | ICD-10-CM | POA: Diagnosis present

## 2020-07-13 DIAGNOSIS — I69391 Dysphagia following cerebral infarction: Secondary | ICD-10-CM | POA: Diagnosis not present

## 2020-07-13 DIAGNOSIS — Z993 Dependence on wheelchair: Secondary | ICD-10-CM | POA: Diagnosis not present

## 2020-07-13 DIAGNOSIS — E039 Hypothyroidism, unspecified: Secondary | ICD-10-CM | POA: Diagnosis present

## 2020-07-13 DIAGNOSIS — K2970 Gastritis, unspecified, without bleeding: Secondary | ICD-10-CM | POA: Diagnosis present

## 2020-07-13 DIAGNOSIS — J449 Chronic obstructive pulmonary disease, unspecified: Secondary | ICD-10-CM | POA: Diagnosis present

## 2020-07-13 DIAGNOSIS — I083 Combined rheumatic disorders of mitral, aortic and tricuspid valves: Secondary | ICD-10-CM | POA: Diagnosis present

## 2020-07-13 DIAGNOSIS — Z20822 Contact with and (suspected) exposure to covid-19: Secondary | ICD-10-CM | POA: Diagnosis present

## 2020-07-13 DIAGNOSIS — Z87891 Personal history of nicotine dependence: Secondary | ICD-10-CM | POA: Diagnosis not present

## 2020-07-13 DIAGNOSIS — I248 Other forms of acute ischemic heart disease: Secondary | ICD-10-CM | POA: Diagnosis present

## 2020-07-13 DIAGNOSIS — M19012 Primary osteoarthritis, left shoulder: Secondary | ICD-10-CM | POA: Diagnosis present

## 2020-07-13 DIAGNOSIS — I471 Supraventricular tachycardia: Secondary | ICD-10-CM | POA: Diagnosis not present

## 2020-07-13 DIAGNOSIS — Z87440 Personal history of urinary (tract) infections: Secondary | ICD-10-CM | POA: Diagnosis not present

## 2020-07-13 DIAGNOSIS — Z8711 Personal history of peptic ulcer disease: Secondary | ICD-10-CM | POA: Diagnosis not present

## 2020-07-13 DIAGNOSIS — K2100 Gastro-esophageal reflux disease with esophagitis, without bleeding: Secondary | ICD-10-CM | POA: Diagnosis present

## 2020-07-13 DIAGNOSIS — E860 Dehydration: Secondary | ICD-10-CM | POA: Diagnosis present

## 2020-07-13 DIAGNOSIS — R195 Other fecal abnormalities: Secondary | ICD-10-CM | POA: Diagnosis present

## 2020-07-13 LAB — CBC WITH DIFFERENTIAL/PLATELET
Abs Immature Granulocytes: 0.01 10*3/uL (ref 0.00–0.07)
Basophils Absolute: 0.1 10*3/uL (ref 0.0–0.1)
Basophils Relative: 2 %
Eosinophils Absolute: 0.2 10*3/uL (ref 0.0–0.5)
Eosinophils Relative: 4 %
HCT: 36.2 % (ref 36.0–46.0)
Hemoglobin: 11 g/dL — ABNORMAL LOW (ref 12.0–15.0)
Immature Granulocytes: 0 %
Lymphocytes Relative: 28 %
Lymphs Abs: 1.5 10*3/uL (ref 0.7–4.0)
MCH: 27.4 pg (ref 26.0–34.0)
MCHC: 30.4 g/dL (ref 30.0–36.0)
MCV: 90 fL (ref 80.0–100.0)
Monocytes Absolute: 0.6 10*3/uL (ref 0.1–1.0)
Monocytes Relative: 11 %
Neutro Abs: 2.9 10*3/uL (ref 1.7–7.7)
Neutrophils Relative %: 55 %
Platelets: 184 10*3/uL (ref 150–400)
RBC: 4.02 MIL/uL (ref 3.87–5.11)
RDW: 13.1 % (ref 11.5–15.5)
WBC: 5.3 10*3/uL (ref 4.0–10.5)
nRBC: 0 % (ref 0.0–0.2)

## 2020-07-13 LAB — BASIC METABOLIC PANEL
Anion gap: 9 (ref 5–15)
BUN: 13 mg/dL (ref 8–23)
CO2: 26 mmol/L (ref 22–32)
Calcium: 10.3 mg/dL (ref 8.9–10.3)
Chloride: 104 mmol/L (ref 98–111)
Creatinine, Ser: 0.79 mg/dL (ref 0.44–1.00)
GFR, Estimated: >60 mL/min (ref >60)
Glucose, Bld: 98 mg/dL (ref 70–99)
Potassium: 4.3 mmol/L (ref 3.5–5.1)
Sodium: 139 mmol/L (ref 135–145)

## 2020-07-13 LAB — PHOSPHORUS: Phosphorus: 2.6 mg/dL (ref 2.5–4.6)

## 2020-07-13 LAB — MAGNESIUM: Magnesium: 2 mg/dL (ref 1.7–2.4)

## 2020-07-13 NOTE — Progress Notes (Signed)
Sent Dr. Allena Katz a text page to call daughter. She is requesting the results of yesterdays head CT and updates

## 2020-07-13 NOTE — Progress Notes (Signed)
Progress Note  Patient Name: April Mccoy Date of Encounter: 07/13/2020  Primary Cardiologist: Little Ishikawa, MD  Subjective   No reported chest pain or palpitations.  Inpatient Medications    Scheduled Meds: . aspirin EC  81 mg Oral Daily  . benazepril  10 mg Oral Daily  . calcium carbonate  1 tablet Oral BID WC  . escitalopram  10 mg Oral Daily  . ferrous sulfate  325 mg Oral Daily  . heparin  5,000 Units Subcutaneous Q8H  . levothyroxine  50 mcg Oral Daily  . liver oil-zinc oxide  1 application Topical QODAY  . pantoprazole (PROTONIX) IV  40 mg Intravenous Q12H  . pindolol  2.5 mg Oral BID  . sodium chloride flush  3 mL Intravenous Q12H  . sucralfate  1 g Oral TID WC & HS    PRN Meds: acetaminophen **OR** acetaminophen, ipratropium-albuterol, ondansetron (ZOFRAN) IV   Vital Signs    Vitals:   07/12/20 2225 07/12/20 2318 07/13/20 0605 07/13/20 0802  BP:  140/78  128/75  Pulse: 70 65  65  Resp:  18 18 14   Temp:  98 F (36.7 C) 98 F (36.7 C) 98.6 F (37 C)  TempSrc:  Oral Oral Oral  SpO2:  98%  99%  Weight:   39 kg   Height:        Intake/Output Summary (Last 24 hours) at 07/13/2020 0816 Last data filed at 07/13/2020 0615 Gross per 24 hour  Intake 60 ml  Output 1350 ml  Net -1290 ml   Filed Weights   07/12/20 0426 07/13/20 0605  Weight: 42.8 kg 39 kg    Telemetry    Sinus rhythm, no recurrent SVT.  Personally reviewed.  ECG    An ECG dated 07/10/2020 was personally reviewed today and demonstrated:  Sinus rhythm with prolonged PR interval, nonspecific ST changes.  Physical Exam   GEN:  Frail-appearing elderly woman, no acute distress.   Neck: No JVD. Cardiac:  RRR with soft systolic murmur.  Respiratory: Nonlabored.  Clear to auscultation anteriorly. GI: Soft, nontender, bowel sounds present. MS:  Kyphosis noted.  No pitting edema.  Labs    Chemistry Recent Labs  Lab 07/10/20 1248 07/11/20 0223 07/12/20 0926  NA 141 141  139  K 3.9 3.9 4.2  CL 108 108 105  CO2 26 25 25   GLUCOSE 136* 80 97  BUN 20 17 15   CREATININE 0.92 0.68 0.70  CALCIUM 10.2 9.6 9.7  PROT 5.7* 4.8* 5.1*  ALBUMIN 3.2* 2.9* 3.0*  AST 21 21 19   ALT 15 16 14   ALKPHOS 80 66 70  BILITOT 0.7 0.7 0.6  GFRNONAA 60* >60 >60  ANIONGAP 7 8 9      Hematology Recent Labs  Lab 07/10/20 1248 07/10/20 1248 07/11/20 0223 07/11/20 0828 07/12/20 0926  WBC 5.9  --  4.4  --  4.5  RBC 4.27   < > 3.71* 4.45 4.21  HGB 12.0  --  10.2*  --  11.7*  HCT 40.0  --  34.5*  --  38.3  MCV 93.7  --  93.0  --  91.0  MCH 28.1  --  27.5  --  27.8  MCHC 30.0  --  29.6*  --  30.5  RDW 13.0  --  13.0  --  13.0  PLT 200  --  158  --  161   < > = values in this interval not displayed.    Cardiac Enzymes  Recent Labs  Lab 07/10/20 2204 07/11/20 0223 07/11/20 0828 07/11/20 0959 07/11/20 1216  TROPONINIHS 576* 565* 449* 396* 371*    Radiology    CT HEAD WO CONTRAST  Result Date: 07/12/2020 CLINICAL DATA:  Vertigo, peripheral EXAM: CT HEAD WITHOUT CONTRAST TECHNIQUE: Contiguous axial images were obtained from the base of the skull through the vertex without intravenous contrast. COMPARISON:  Head CT 04/09/2016 FINDINGS: Brain: Generalized atrophy and chronic small vessel ischemia with progression from 2017. Cerebellar atrophy is not significantly changed. There is encephalomalacia in the left parietooccipital cortex that was not seen on prior exam, likely represents interval but remote infarct. There is no evidence of acute ischemia. No hemorrhage. No subdural or extra-axial collection. No hydrocephalus, midline shift, or mass effect. Vascular: Atherosclerosis of skullbase vasculature without hyperdense vessel or abnormal calcification. Skull: No fracture or focal lesion. Sinuses/Orbits: Paranasal sinuses and mastoid air cells are clear. The visualized orbits are unremarkable. Bilateral cataract resection. Other: None. IMPRESSION: 1. No acute intracranial  abnormality. 2. Generalized atrophy and chronic small vessel ischemia with progression from 2017. Remote left parietooccipital infarct. Electronically Signed   By: Narda Rutherford M.D.   On: 07/12/2020 18:53   ECHOCARDIOGRAM COMPLETE  Result Date: 07/11/2020    ECHOCARDIOGRAM REPORT   Patient Name:   April Mccoy Date of Exam: 07/11/2020 Medical Rec #:  [id number]       Height:       59.0 in Accession #:    [id number]      Weight:       87.1 lb Date of Birth:  November 10, 1928        BSA:          1.295 m Patient Age:    86 years        BP:           153/75 mmHg Patient Gender: F               HR:           55 bpm. Exam Location:  Inpatient Procedure: 2D Echo, Color Doppler and Cardiac Doppler Indications:    R94.31 Abnormal EKG  History:        Patient has no prior history of Echocardiogram examinations.                 Stroke and COPD, Signs/Symptoms:Shortness of Breath and Dyspnea;                 Risk Factors:Current Smoker and Hypertension.  Sonographer:    Eulah Pont RDCS Referring Phys: [id number] CHRISTOPHER L SCHUMANN IMPRESSIONS  1. Left ventricular ejection fraction, by estimation, is 55 to 60%. The left ventricle has normal function. The left ventricle has no regional wall motion abnormalities. Left ventricular diastolic parameters are consistent with Grade II diastolic dysfunction (pseudonormalization).  2. Right ventricular systolic function is normal. The right ventricular size is normal. There is mildly elevated pulmonary artery systolic pressure.  3. Left atrial size was mildly dilated.  4. Mild mitral valve regurgitation.  5. Tricuspid valve regurgitation is mild to moderate.  6. The aortic valve is abnormal. Aortic valve regurgitation is mild to moderate. Mild aortic valve sclerosis is present, with no evidence of aortic valve stenosis.  7. The inferior vena cava is normal in size with greater than 50% respiratory variability, suggesting right atrial pressure of 3 mmHg. FINDINGS  Left Ventricle:  Left ventricular ejection fraction, by estimation, is 55 to 60%. The left ventricle has normal  function. The left ventricle has no regional wall motion abnormalities. The left ventricular internal cavity size was normal in size. There is  no left ventricular hypertrophy. Left ventricular diastolic parameters are consistent with Grade II diastolic dysfunction (pseudonormalization). Right Ventricle: The right ventricular size is normal. No increase in right ventricular wall thickness. Right ventricular systolic function is normal. There is mildly elevated pulmonary artery systolic pressure. The tricuspid regurgitant velocity is 2.96  m/s, and with an assumed right atrial pressure of 3 mmHg, the estimated right ventricular systolic pressure is 38.0 mmHg. Left Atrium: Left atrial size was mildly dilated. Right Atrium: Right atrial size was normal in size. Pericardium: There is no evidence of pericardial effusion. Mitral Valve: The mitral valve is abnormal. There is mild thickening of the mitral valve leaflet(s). Mild mitral valve regurgitation. Tricuspid Valve: The tricuspid valve is normal in structure. Tricuspid valve regurgitation is mild to moderate. Aortic Valve: The aortic valve is abnormal. Aortic valve regurgitation is mild to moderate. Aortic regurgitation PHT measures 727 msec. Mild aortic valve sclerosis is present, with no evidence of aortic valve stenosis. Pulmonic Valve: The pulmonic valve was not well visualized. Pulmonic valve regurgitation is mild. Aorta: The aortic root is normal in size and structure. Venous: The inferior vena cava is normal in size with greater than 50% respiratory variability, suggesting right atrial pressure of 3 mmHg. IAS/Shunts: No atrial level shunt detected by color flow Doppler.  LEFT VENTRICLE PLAX 2D LVIDd:         3.70 cm  Diastology LVIDs:         2.90 cm  LV e' medial:    4.58 cm/s LV PW:         0.80 cm  LV E/e' medial:  17.7 LV IVS:        0.80 cm  LV e' lateral:   5.10  cm/s LVOT diam:     1.80 cm  LV E/e' lateral: 15.9 LV SV:         62 LV SV Index:   48 LVOT Area:     2.54 cm  RIGHT VENTRICLE RV S prime:     7.29 cm/s TAPSE (M-mode): 1.4 cm LEFT ATRIUM           Index       RIGHT ATRIUM           Index LA diam:      2.90 cm 2.24 cm/m  RA Area:     11.20 cm LA Vol (A2C): 51.1 ml 39.45 ml/m RA Volume:   27.00 ml  20.84 ml/m LA Vol (A4C): 32.7 ml 25.24 ml/m  AORTIC VALVE LVOT Vmax:   76.50 cm/s LVOT Vmean:  52.400 cm/s LVOT VTI:    0.245 m AI PHT:      727 msec  AORTA Ao Root diam: 3.30 cm MITRAL VALVE               TRICUSPID VALVE MV Area (PHT): 2.87 cm    TR Peak grad:   35.0 mmHg MV Decel Time: 264 msec    TR Vmax:        296.00 cm/s MV E velocity: 81.00 cm/s MV A velocity: 73.20 cm/s  SHUNTS MV E/A ratio:  1.11        Systemic VTI:  0.24 m                            Systemic Diam: 1.80 cm Gunnar Fusi  Tenny Crawoss MD Electronically signed by Dietrich PatesPaula Ross MD Signature Date/Time: 07/11/2020/3:24:21 PM    Final     Patient Profile     86 y.o. female with a history of COPD, depression, GERD and duodenal ulcer anemia, hypertension, and hypothyroidism now presenting with newly documented SVT requiring adenosine.  Assessment & Plan    1.  SVT, successfully treated with adenosine.  Patient initially started on Toprol-XL 12.5 mg daily however this was discontinued due to bradycardia with heart rates in the 40s and 50s.  She is asymptomatic at this time and has had no further arrhythmias by telemetry.  Echocardiogram demonstrates normal LVEF at 55 to 60%.  Per discussion yesterday, patient initiated on low-dose pindolol 2.5 mg twice daily, tolerating so far.  2.  Mild high-sensitivity troponin I elevation and relatively flat pattern, peak of 576.  Most consistent with demand ischemia rather than ACS.  She does not report any chest pain.  Echocardiogram does not show any wall motion abnormalities.  Would continue pindolol 2.5 mg twice daily, heart rate in the 50s to 60s but asymptomatic  and no pauses.  No further SVT by telemetry.  At discharge tomorrow would arrange 72-hour Zio patch mainly to assess heart rate variability on pindolol.  Would then arrange follow-up with Dr. Bjorn PippinSchumann or APP in the next 2 weeks.  Signed, Nona DellSamuel Suraya Vidrine, MD  07/13/2020, 8:16 AM

## 2020-07-13 NOTE — Progress Notes (Addendum)
PROGRESS NOTE    April Mccoy  BWL:[id number] DOB: February 27, 1929 DOA: 07/10/2020  PCP: Ailene Ravel, MD    Brief Narrative: April Mccoy is a 86 y.o. female with medical history significant of anemia, COPD, depression, GERD/history of duodenal ulcer, hypertension, hypothyroidism who presents with recent nausea and vomiting and was found to be in SVT by EMS.  History obtained with assistance of chart review and patient's daughter as she is a difficult historian.  She has had some episodes of nausea vomiting this morning that she felt better after vomiting. EMS was called and her heart rate is found to be in the 180s and apparent SVT.  She received 1 dose of adenosine in the ED which converted her rhythm back to sinus.  She denies chest pain, shortness of breath, abdominal pain, constipation or diarrhea when seen.  ED Course: Vitals in the ED significant for SVT initially which resolved with adenosine as above, otherwise vital signs stable.  Lab work showed normal BMP, normal magnesium, normal CBC.  LFTs showed mildly low protein and albumin.  Troponin up trended from 43-2 38.  Respiratory panel was negative.  UA and lipase pending.  Patient was seen by cardiology in the ED who recommended starting a beta-blocker and observing overnight.  Chest x-ray was without acute abnormality and CT abdomen showed chronic changes without acute abnormality.   Assessment & Plan: Principal Problem:   SVT (supraventricular tachycardia) (HCC): Pt has been started on responded well with metoprolol. TFT check. 07/12/20 Pindolol per Cardiology 2.5 bid.  And hold if HR is 55 and below. 07/13/20 Pt started on pindolol and pt is stable no bradycardia reported.      GERD (gastroesophageal reflux disease) IV PPI. carafate and zofran. ?esophagitis. gastritis.  07/13/20 Cont ppi therapy.    COPD (chronic obstructive pulmonary disease) (HCC) Stable cont duoneb as needed.    Elevated troponin Attribute to  demand ischemia. Cardiology following. 07/13/20 Appreciate cardiology Management and mare.   Anemia: Hemoglobin has dropped from 12 to 10.2 We will obtain iron panel c/w ACD and will transfuse as needed. 07/13/20 Suspect chronic gib. FOBT is positive.   B12 def: Level 183 pt givne 1000 mcg of cyanocobalamin.   FOBT positive: D/w daughter that this is chronic and is probably contributing and causing strain on her heart and we will start ppi therapy. Pt is not a good candidate for gi eval due to risk with sedation and age.   DVT prophylaxis: Heparin          Code Status: DNR Family Communication: None at bedside Disposition Plan: TBD Status is: Observation  The patient remains OBS appropriate and will d/c before 2 midnights.  Dispo: The patient is from: Home              Anticipated d/c is to: Home              Anticipated d/c date is: 2 days              Patient currently is not medically stable to d/c.  Consultants:  Cardiology Dr. Bjorn Pippin.  Procedures:  None  Subjective: Pt is seen today she is asymptomatic and denies any complaints of chest pain or sob. Pt is alert and oriented.She came in SVT rhythm which I suspect was caused by underlying issues.   07/12/20 Pt is alert,awake and oriented to self and location .  07/13/20 Pt seen today ane she is up in recliner and respond as before  and is at baseline. I did  Reach to daughter and d/w her results of her ct and her chronic gi bleed.   Objective: Vitals:   07/13/20 0802 07/13/20 0838 07/13/20 1100 07/13/20 1532  BP: 128/75 (!) 112/55 122/73 136/72  Pulse: 65 (!) 59 62 (!) 54  Resp: Temp: 98.6 F (37 C) 97.9 F (36.6 C) 98.2 F (36.8 C)   TempSrc: Oral Oral Oral   SpO2: 99% 96% 98%   Weight:      Height:       SpO2: 98 % O2 Flow Rate (L/min): 2 L/min  Intake/Output Summary (Last 24 hours) at 07/13/2020 1624 Last data filed at 07/13/2020 1242 Gross per 24 hour  Intake 300 ml  Output 1550 ml   Net -1250 ml   Filed Weights   07/12/20 0426 07/13/20 0605  Weight: 42.8 kg 39 kg    Examination: Blood pressure 136/72, pulse (!) 54, temperature 98.2 F (36.8 C), temperature source Oral, resp. rate 18, height  (1.499 m), weight 39 kg, SpO2 98 %. General exam: Appears frail and thin.  HEENT:EOMI, perrl  Respiratory system: Clear to auscultation. Respiratory effort normal. Cardiovascular system: S1 & S2 heard, RRR. No JVD, + in apt area.murmurs, rubs, gallops or clicks. No pedal edema. Gastrointestinal system: Abdomen is nondistended, soft and nontender. No organomegaly or masses felt. Normal bowel sounds heard. Central nervous system: Alert and oriented.CN grossly intact No focal neurological deficits. Extremities: pt moving all 4 ext and ambulating. Skin: No rashes, lesions or ulcers Psychiatry: Judgement and insight appear normal. Mood & affect appropriate.   Data Reviewed: I have personally reviewed following labs and imaging studies  I/O last 3 completed shifts: In: 63 [P.O.:60; I.V.:3] Out: 1350 [Urine:1350] Total I/O In: 240 [P.O.:240] Out: 200 [Urine:200] Lab Results  Component Value Date   CREATININE 0.70 07/12/2020   CREATININE 0.68 07/11/2020   CREATININE 0.92 07/10/2020   CBC: Recent Labs  Lab 07/10/20 1248 07/11/20 0223 07/12/20 0926  WBC 5.9 4.4 4.5  NEUTROABS 3.9  --  3.0  HGB 12.0 10.2* 11.7*  HCT 40.0 34.5* 38.3  MCV 93.7 93.0 91.0  PLT 200 158 161   Basic Metabolic Panel: Recent Labs  Lab 07/10/20 1248 07/11/20 0223 07/12/20 0058 07/12/20 0926  NA 141 141  --  139  K 3.9 3.9  --  4.2  CL 108 108  --  105  CO2 26 25  --  25  GLUCOSE 136* 80  --  97  BUN 20 17  --  15  CREATININE 0.92 0.68  --  0.70  CALCIUM 10.2 9.6  --  9.7  MG 2.1  --  1.9  --   PHOS  --   --  2.9  --    GFR: Estimated Creatinine Clearance: 29.4 mL/min (by C-G formula based on SCr of 0.7 mg/dL). Liver Function Tests: Recent Labs  Lab 07/10/20 1248  07/11/20 0223 07/12/20 0926  AST ALT ALKPHOS 80 66 70  BILITOT 0.7 0.7 0.6  PROT 5.7* 4.8* 5.1*  ALBUMIN 3.2* 2.9* 3.0*   Recent Labs  Lab 07/10/20 1248  LIPASE 35   No results for input(s): AMMONIA in the last 168 hours.  Coagulation Profile: No results for input(s): INR, PROTIME in the last 168 hours. Cardiac Enzymes: No results for input(s): CKTOTAL, CKMB, CKMBINDEX, TROPONINI in the last 168 hours. BNP (last 3 results) No results  for input(s): PROBNP in the last 8760 hours. HbA1C: No results for input(s): HGBA1C in the last 72 hours. CBG: Recent Labs  Lab 07/12/20 1643  GLUCAP 151*   Lipid Profile: No results for input(s): CHOL, HDL, LDLCALC, TRIG, CHOLHDL, LDLDIRECT in the last 72 hours. Thyroid Function Tests: Recent Labs    07/11/20 0828  TSH 1.329  FREET4 0.91   Anemia Panel: Recent Labs    07/11/20 0828  VITAMINB12 183  FOLATE 17.2  FERRITIN 22  TIBC 382  IRON 79  RETICCTPCT 0.9   Sepsis Labs: No results for input(s): PROCALCITON, LATICACIDVEN in the last 168 hours.  Recent Results (from the past 240 hour(s))  Respiratory Panel by RT PCR (Flu A&B, Covid) - Nasopharyngeal Swab     Status: None   Collection Time: 07/10/20  3:56 PM   Specimen: Nasopharyngeal Swab  Result Value Ref Range Status   SARS Coronavirus 2 by RT PCR NEGATIVE NEGATIVE Final    Comment: (NOTE) SARS-CoV-2 target nucleic acids are NOT DETECTED.  The SARS-CoV-2 RNA is generally detectable in upper respiratoy specimens during the acute phase of infection. The lowest concentration of SARS-CoV-2 viral copies this assay can detect is 131 copies/mL. A negative result does not preclude SARS-Cov-2 infection and should not be used as the sole basis for treatment or other patient management decisions. A negative result may occur with  improper specimen collection/handling, submission of specimen other than nasopharyngeal swab, presence of viral mutation(s)  within the areas targeted by this assay, and inadequate number of viral copies (<131 copies/mL). A negative result must be combined with clinical observations, patient history, and epidemiological information. The expected result is Negative.  Fact Sheet for Patients:  https://www.moore.com/  Fact Sheet for Healthcare Providers:  https://www.young.biz/  This test is no t yet approved or cleared by the [place] FDA and  has been authorized for detection and/or diagnosis of SARS-CoV-2 by FDA under an Emergency Use Authorization (EUA). This EUA will remain  in effect (meaning this test can be used) for the duration of the COVID-19 declaration under Section 564(b)(1) of the Act, 21 U.S.C. section 360bbb-3(b)(1), unless the authorization is terminated or revoked sooner.     Influenza A by PCR NEGATIVE NEGATIVE Final   Influenza B by PCR NEGATIVE NEGATIVE Final    Comment: (NOTE) The Xpert Xpress SARS-CoV-2/FLU/RSV assay is intended as an aid in  the diagnosis of influenza from Nasopharyngeal swab specimens and  should not be used as a sole basis for treatment. Nasal washings and  aspirates are unacceptable for Xpert Xpress SARS-CoV-2/FLU/RSV  testing.  Fact Sheet for Patients: https://www.moore.com/  Fact Sheet for Healthcare Providers: https://www.young.biz/  This test is not yet approved or cleared by the [place] FDA and  has been authorized for detection and/or diagnosis of SARS-CoV-2 by  FDA under an Emergency Use Authorization (EUA). This EUA will remain  in effect (meaning this test can be used) for the duration of the  Covid-19 declaration under Section 564(b)(1) of the Act, 21  U.S.C. section 360bbb-3(b)(1), unless the authorization is  terminated or revoked. Performed at [facility], [address] [place], [place] [address]       Radiology Studies: CT ABDOMEN PELVIS W  CONTRAST  Result Date: 07/10/2020 CLINICAL DATA:  Nausea and vomiting EXAM: CT ABDOMEN AND PELVIS WITH CONTRAST TECHNIQUE: Multidetector CT imaging of the abdomen and pelvis was performed using the standard protocol following bolus administration of intravenous contrast. CONTRAST:  75mL OMNIPAQUE  IOHEXOL 300 MG/ML  SOLN COMPARISON:  None. FINDINGS: Lower chest: Lung bases demonstrate some mild peribronchial thickening as well as mild left basilar atelectasis. Hepatobiliary: Fatty infiltration of the liver is noted. The gallbladder appears within normal limits. Pancreas: Unremarkable. No pancreatic ductal dilatation or surrounding inflammatory changes. Spleen: Normal in size without focal abnormality. Adrenals/Urinary Tract: Adrenal glands are within normal limits. Kidneys demonstrate a normal enhancement pattern bilaterally. Small lower pole right renal cyst and left upper pole renal cyst are noted. No calculi are seen. No obstructive changes are noted. The bladder is partially distended. Stomach/Bowel: No obstructive or inflammatory changes of the colon are noted. The appendix is not well visualized although no inflammatory changes are seen to suggest appendicitis. Small bowel and stomach appear within normal limits. Vascular/Lymphatic: Atherosclerotic calcifications of the abdominal aorta are noted. No aneurysmal dilatation is seen. No significant lymphadenopathy is noted. Reproductive: Status post hysterectomy. No adnexal masses. Other: No abdominal wall hernia or abnormality. No abdominopelvic ascites. Musculoskeletal: Postsurgical changes in the proximal femurs are noted bilaterally. Generalized osteopenia is seen. Compression deformities are noted at L2, L3 and L4 as well as T11 and T12. These are stable from a prior plain film examination from February of 2020 IMPRESSION: Mild left basilar atelectasis. Bilateral renal cysts. Chronic compression deformities in the lumbar spine and thoracic spine Aortic  Atherosclerosis (ICD10-I70.0). Electronically Signed   By: Alcide CleverMark  Lukens M.D.   On: 07/10/2020 17:17   DG Chest Portable 1 View  Result Date: 07/10/2020 CLINICAL DATA:  Supraventricular tachycardia. EXAM: PORTABLE CHEST 1 VIEW COMPARISON:  April 22, 2020. FINDINGS: Stable cardiomediastinal silhouette. No pneumothorax or pleural effusion is noted. Both lungs are clear. The visualized skeletal structures are unremarkable. IMPRESSION: No active disease. Aortic Atherosclerosis (ICD10-I70.0). Electronically Signed   By: Lupita RaiderJames  Green Jr M.D.   On: 07/10/2020 13:26   ECHOCARDIOGRAM COMPLETE  Result Date: 07/11/2020    ECHOCARDIOGRAM REPORT   Patient Name:   Henderson NewcomerVALLIE M Bihl Date of Exam: 07/11/2020 Medical Rec #:  [id number]       Height:       59.0 in Accession #:    [id number]      Weight:       87.1 lb Date of Birth:  01/21/1929        BSA:          1.295 m Patient Age:    89 years        BP:           153/75 mmHg Patient Gender: F               HR:           55 bpm. Exam Location:  Inpatient Procedure: 2D Echo, Color Doppler and Cardiac Doppler Indications:    R94.31 Abnormal EKG  History:        Patient has no prior history of Echocardiogram examinations.                 Stroke and COPD, Signs/Symptoms:Shortness of Breath and Dyspnea;                 Risk Factors:Current Smoker and Hypertension.  Sonographer:    Eulah PontSarah Pirrotta RDCS Referring Phys: [id number] CHRISTOPHER L SCHUMANN IMPRESSIONS  1. Left ventricular ejection fraction, by estimation, is 55 to 60%. The left ventricle has normal function. The left ventricle has no regional wall motion abnormalities. Left ventricular diastolic parameters are consistent with Grade II diastolic dysfunction (pseudonormalization).  2.  Right ventricular systolic function is normal. The right ventricular size is normal. There is mildly elevated pulmonary artery systolic pressure.  3. Left atrial size was mildly dilated.  4. Mild mitral valve regurgitation.  5. Tricuspid valve  regurgitation is mild to moderate.  6. The aortic valve is abnormal. Aortic valve regurgitation is mild to moderate. Mild aortic valve sclerosis is present, with no evidence of aortic valve stenosis.  7. The inferior vena cava is normal in size with greater than 50% respiratory variability, suggesting right atrial pressure of 3 mmHg. FINDINGS  Left Ventricle: Left ventricular ejection fraction, by estimation, is 55 to 60%. The left ventricle has normal function. The left ventricle has no regional wall motion abnormalities. The left ventricular internal cavity size was normal in size. There is  no left ventricular hypertrophy. Left ventricular diastolic parameters are consistent with Grade II diastolic dysfunction (pseudonormalization). Right Ventricle: The right ventricular size is normal. No increase in right ventricular wall thickness. Right ventricular systolic function is normal. There is mildly elevated pulmonary artery systolic pressure. The tricuspid regurgitant velocity is 2.96  m/s, and with an assumed right atrial pressure of 3 mmHg, the estimated right ventricular systolic pressure is 38.0 mmHg. Left Atrium: Left atrial size was mildly dilated. Right Atrium: Right atrial size was normal in size. Pericardium: There is no evidence of pericardial effusion. Mitral Valve: The mitral valve is abnormal. There is mild thickening of the mitral valve leaflet(s). Mild mitral valve regurgitation. Tricuspid Valve: The tricuspid valve is normal in structure. Tricuspid valve regurgitation is mild to moderate. Aortic Valve: The aortic valve is abnormal. Aortic valve regurgitation is mild to moderate. Aortic regurgitation PHT measures 727 msec. Mild aortic valve sclerosis is present, with no evidence of aortic valve stenosis. Pulmonic Valve: The pulmonic valve was not well visualized. Pulmonic valve regurgitation is mild. Aorta: The aortic root is normal in size and structure. Venous: The inferior vena cava is normal in  size with greater than 50% respiratory variability, suggesting right atrial pressure of 3 mmHg. IAS/Shunts: No atrial level shunt detected by color flow Doppler.  LEFT VENTRICLE PLAX 2D LVIDd:         3.70 cm  Diastology LVIDs:         2.90 cm  LV e' medial:    4.58 cm/s LV PW:         0.80 cm  LV E/e' medial:  17.7 LV IVS:        0.80 cm  LV e' lateral:   5.10 cm/s LVOT diam:     1.80 cm  LV E/e' lateral: 15.9 LV SV:         62 LV SV Index:   48 LVOT Area:     2.54 cm  RIGHT VENTRICLE RV S prime:     7.29 cm/s TAPSE (M-mode): 1.4 cm LEFT ATRIUM           Index       RIGHT ATRIUM           Index LA diam:      2.90 cm 2.24 cm/m  RA Area:     11.20 cm LA Vol (A2C): 51.1 ml 39.45 ml/m RA Volume:   27.00 ml  20.84 ml/m LA Vol (A4C): 32.7 ml 25.24 ml/m  AORTIC VALVE LVOT Vmax:   76.50 cm/s LVOT Vmean:  52.400 cm/s LVOT VTI:    0.245 m AI PHT:      727 msec  AORTA Ao Root diam: 3.30 cm  MITRAL VALVE               TRICUSPID VALVE MV Area (PHT): 2.87 cm    TR Peak grad:   35.0 mmHg MV Decel Time: 264 msec    TR Vmax:        296.00 cm/s MV E velocity: 81.00 cm/s MV A velocity: 73.20 cm/s  SHUNTS MV E/A ratio:  1.11        Systemic VTI:  0.24 m                            Systemic Diam: 1.80 cm Dietrich Pates MD Electronically signed by Dietrich Pates MD Signature Date/Time: 07/11/2020/3:24:21 PM    Final       Current Facility-Administered Medications (Endocrine & Metabolic):  .  levothyroxine (SYNTHROID) tablet 50 mcg  Current Outpatient Medications (Endocrine & Metabolic):  .  levothyroxine (SYNTHROID) 50 MCG tablet, Take 50 mcg by mouth daily.  Current Facility-Administered Medications (Cardiovascular):  .  benazepril (LOTENSIN) tablet 10 mg .  metoprolol succinate (TOPROL-XL) 24 hr tablet 12.5 mg  Current Outpatient Medications (Cardiovascular):  .  benazepril (LOTENSIN) 10 MG tablet, Take 10 mg by mouth daily.   Current Facility-Administered Medications (Respiratory):  .  ipratropium-albuterol (DUONEB)  0.5-2.5 (3) MG/3ML nebulizer solution 3 mL  Current Outpatient Medications (Respiratory):  .  albuterol (PROVENTIL) (2.5 MG/3ML) 0.083% nebulizer solution, Take 3 mLs (2.5 mg total) by nebulization every 6 (six) hours as needed for wheezing or shortness of breath. Marland Kitchen  ipratropium-albuterol (DUONEB) 0.5-2.5 (3) MG/3ML SOLN, Take 3 mLs by nebulization 2 (two) times daily as needed (for breathing).   Current Facility-Administered Medications (Analgesics):  .  acetaminophen (TYLENOL) tablet 650 mg **OR** acetaminophen (TYLENOL) suppository 650 mg  .  aspirin EC tablet 81 mg  Current Outpatient Medications (Analgesics):  .  acetaminophen (TYLENOL) 325 MG tablet, Take 325 mg by mouth every 6 (six) hours as needed for moderate pain. Marland Kitchen  aspirin EC 81 MG tablet, Take 1 tablet (81 mg total) by mouth daily.  Current Facility-Administered Medications (Hematological):  .  ferrous sulfate tablet 325 mg .  heparin injection 5,000 Units  Current Outpatient Medications (Hematological):  .  ferrous sulfate 325 (65 FE) MG tablet, Take 325 mg by mouth daily with breakfast.  Current Facility-Administered Medications (Other):  .  calcium carbonate (OS-CAL - dosed in mg of elemental calcium) tablet 500 mg of elemental calcium .  escitalopram (LEXAPRO) tablet 10 mg .  liver oil-zinc oxide (DESITIN) 40 % ointment 1 application .  pantoprazole (PROTONIX) EC tablet 40 mg .  sodium chloride flush (NS) 0.9 % injection 3 mL  Current Outpatient Medications (Other):  .  calcium carbonate (OS-CAL - DOSED IN MG OF ELEMENTAL CALCIUM) 1250 MG tablet, Take 1 tablet (500 mg of elemental calcium total) by mouth 2 (two) times daily with a meal. .  escitalopram (LEXAPRO) 10 MG tablet, Take 10 mg by mouth daily.  .  ondansetron (ZOFRAN) 4 MG tablet, Take 4 mg by mouth every 8 (eight) hours as needed for nausea or vomiting.  .  pantoprazole (PROTONIX) 40 MG tablet, Take 40 mg by mouth daily. Marland Kitchen  Propylene Glycol (SYSTANE  BALANCE) 0.6 % SOLN, Apply 1 drop to eye daily as needed (for dry eyes).  .  protective barrier (RESTORE) CREA, Apply 1 application topically every other day.   Anti-infectives (From admission, onward)   None      Scheduled  Meds: . aspirin EC  81 mg Oral Daily  . benazepril  10 mg Oral Daily  . calcium carbonate  1 tablet Oral BID WC  . escitalopram  10 mg Oral Daily  . ferrous sulfate  325 mg Oral Daily  . heparin  5,000 Units Subcutaneous Q8H  . levothyroxine  50 mcg Oral Daily  . liver oil-zinc oxide  1 application Topical QODAY  . pantoprazole (PROTONIX) IV  40 mg Intravenous Q12H  . pindolol  2.5 mg Oral BID  . sodium chloride flush  3 mL Intravenous Q12H  . sucralfate  1 g Oral TID WC & HS   Continuous Infusions:   LOS: 0 days    Gertha Calkin, MD [facility] Pager [phone] If 7PM-7AM, please contact night-coverage [email or web address] Password [facility] 07/13/2020, 4:24 PM   Received text from nurse about pt's speech being concerning and to call her , number was incomplete so I went to assess at bedside asap and exam to me is similar to today am, but pt has speech difference noted by team and stat  head ct  done and mri brain with mra pending. There is a risk pt is having mini strokes or embolic strokes . Await mri and carotid.

## 2020-07-13 NOTE — Progress Notes (Addendum)
Called to room by nursing student of patients change in speech. I put the patient on the phone with a "friend" per Ms. Demarco at 3 pm in which I heard her having a normal converstaion as she has been all day with staff and other phone calls she has received. However, at 1525, I noticed her speech was as not clearly articulated as it has been all day. She also stated that her "vision was blurry a bit ago but it isn't now." There were no motor deficits noted but she did have dysarthria. She does have upper dentures in and no lower dentures, however, her speech has been clear since I have had her on my shift. I called Luisa Hart with Rapid response. He quickly came to bedside and decided for [place] to call a code stroke. Code stroke activated and Dr. Allena Katz and Neurologist came to bedside. Once patient finished CT, she was transported back to her room in stable condition. I notified her daughter of above incident and she was very pleased with the care and expressed appreciation. Will continue to monitor

## 2020-07-13 NOTE — Progress Notes (Signed)
Went into the patient's room because her call bell was going off. She informed me that she was feeling dizzy. I took her blood pressure which was 136/72, pulse of 54. I noticed her speech was changing. It was much more difficult to understand with noticeable difficulty in articulating her words which was a decline from her baseline. She also appeared to have difficulty when trying to focus on my face as I spoke to her. I immediately notified my clinical instructor and the RN Ulyess Mort) of the changes who came to assess the patient.   Debbe Mounts, Student RN

## 2020-07-14 ENCOUNTER — Other Ambulatory Visit: Payer: Self-pay | Admitting: Cardiology

## 2020-07-14 DIAGNOSIS — I471 Supraventricular tachycardia, unspecified: Secondary | ICD-10-CM

## 2020-07-14 MED ORDER — SUCRALFATE 1 G PO TABS: 1.0000 g | ORAL_TABLET | Freq: Three times a day (TID) | ORAL | 0 refills | Status: DC

## 2020-07-14 MED ORDER — PINDOLOL 5 MG PO TABS: 2.5000 mg | ORAL_TABLET | Freq: Two times a day (BID) | ORAL | 0 refills | Status: DC

## 2020-07-14 NOTE — Progress Notes (Addendum)
Progress Note  Patient Name: April Mccoy Date of Encounter: 07/14/2020  Primary Cardiologist: Little Ishikawa, MD  Subjective   Doing well this AM. No chest pain.  Eating breakfast. No further SVT episodes   Inpatient Medications    Scheduled Meds: . aspirin EC  81 mg Oral Daily  . benazepril  10 mg Oral Daily  . calcium carbonate  1 tablet Oral BID WC  . escitalopram  10 mg Oral Daily  . ferrous sulfate  325 mg Oral Daily  . heparin  5,000 Units Subcutaneous Q8H  . levothyroxine  50 mcg Oral Daily  . liver oil-zinc oxide  1 application Topical QODAY  . pantoprazole (PROTONIX) IV  40 mg Intravenous Q12H  . pindolol  2.5 mg Oral BID  . sodium chloride flush  3 mL Intravenous Q12H  . sucralfate  1 g Oral TID WC & HS   Continuous Infusions:  PRN Meds: acetaminophen **OR** acetaminophen, ipratropium-albuterol, ondansetron (ZOFRAN) IV   Vital Signs    Vitals:   07/13/20 1934 07/13/20 2205 07/13/20 2343 07/14/20 0326  BP: (!) 106/57  110/62 112/66  Pulse: 65 61 60 66  Resp:      Temp:   98 F (36.7 C) 98.7 F (37.1 C)  TempSrc: Oral  Oral Oral  SpO2: 98%  98% 95%  Weight:    41.9 kg  Height:        Intake/Output Summary (Last 24 hours) at 07/14/2020 0752 Last data filed at 07/14/2020 0644 Gross per 24 hour  Intake 360 ml  Output 500 ml  Net -140 ml   Filed Weights   07/12/20 0426 07/13/20 0605 07/14/20 0326  Weight: 42.8 kg 39 kg 41.9 kg    Physical Exam   General: Elderly, NAD Neck: Negative for carotid bruits. No JVD Lungs:Clear to ausculation bilaterally. No wheezes, rales, or rhonchi. Breathing is unlabored. Cardiovascular: RRR with S1 S2. No murmurs Abdomen: Soft, non-tender, non-distended. No obvious abdominal masses. Extremities: No edema. Radial pulses 2+ bilaterally Neuro: Alert and oriented to person. No focal deficits. No facial asymmetry. MAE spontaneously. Psych: Responds to questions somewhat appropriately with normal  affect.    Labs    Chemistry Recent Labs  Lab 07/10/20 1248 07/10/20 1248 07/11/20 0223 07/12/20 0926 07/13/20 1643  NA 141   < > 141 139 139  K 3.9   < > 3.9 4.2 4.3  CL 108   < > 108 105 104  CO2 26   < > 25 25 26   GLUCOSE 136*   < > 80 97 98  BUN 20   < > 17 15 13   CREATININE 0.92   < > 0.68 0.70 0.79  CALCIUM 10.2   < > 9.6 9.7 10.3  PROT 5.7*  --  4.8* 5.1*  --   ALBUMIN 3.2*  --  2.9* 3.0*  --   AST 21  --  21 19  --   ALT 15  --  16 14  --   ALKPHOS 80  --  66 70  --   BILITOT 0.7  --  0.7 0.6  --   GFRNONAA 60*   < > >60 >60 >60  ANIONGAP 7   < > 8 9 9    < > = values in this interval not displayed.     Hematology Recent Labs  Lab 07/11/20 0223 07/11/20 0828 07/12/20 0926 07/13/20 1643  WBC 4.4  --  4.5 5.3  RBC 3.71*  4.45 4.21 4.02  HGB 10.2*  --  11.7* 11.0*  HCT 34.5*  --  38.3 36.2  MCV 93.0  --  91.0 90.0  MCH 27.5  --  27.8 27.4  MCHC 29.6*  --  30.5 30.4  RDW 13.0  --  13.0 13.1  PLT 158  --  161 184    Cardiac EnzymesNo results for input(s): TROPONINI in the last 168 hours. No results for input(s): TROPIPOC in the last 168 hours.   BNPNo results for input(s): BNP, PROBNP in the last 168 hours.   DDimer No results for input(s): DDIMER in the last 168 hours.   Radiology    CT HEAD WO CONTRAST  Result Date: 07/12/2020 CLINICAL DATA:  Vertigo, peripheral EXAM: CT HEAD WITHOUT CONTRAST TECHNIQUE: Contiguous axial images were obtained from the base of the skull through the vertex without intravenous contrast. COMPARISON:  Head CT 04/09/2016 FINDINGS: Brain: Generalized atrophy and chronic small vessel ischemia with progression from 2017. Cerebellar atrophy is not significantly changed. There is encephalomalacia in the left parietooccipital cortex that was not seen on prior exam, likely represents interval but remote infarct. There is no evidence of acute ischemia. No hemorrhage. No subdural or extra-axial collection. No hydrocephalus, midline shift,  or mass effect. Vascular: Atherosclerosis of skullbase vasculature without hyperdense vessel or abnormal calcification. Skull: No fracture or focal lesion. Sinuses/Orbits: Paranasal sinuses and mastoid air cells are clear. The visualized orbits are unremarkable. Bilateral cataract resection. Other: None. IMPRESSION: 1. No acute intracranial abnormality. 2. Generalized atrophy and chronic small vessel ischemia with progression from 2017. Remote left parietooccipital infarct. Electronically Signed   By: Narda Rutherford M.D.   On: 07/12/2020 18:53   CT HEAD CODE STROKE WO CONTRAST  Result Date: 07/13/2020 CLINICAL DATA:  Code stroke.  Acute neuro deficit.  Blurred vision EXAM: CT HEAD WITHOUT CONTRAST TECHNIQUE: Contiguous axial images were obtained from the base of the skull through the vertex without intravenous contrast. COMPARISON:  CT head 07/12/2020 FINDINGS: Brain: 2 cm hypodensity in the left occipital lobe unchanged. This is compatible with infarct and could be subacute or chronic. No associated hemorrhage or extension. Generalized moderate atrophy. Extensive chronic white matter changes are stable. Negative for intracranial hemorrhage. Vascular: Negative for hyperdense vessel Skull: Negative Sinuses/Orbits: Paranasal sinuses clear. Bilateral cataract extraction Other: None ASPECTS (Alberta Stroke Program Early CT Score) - Ganglionic level infarction (caudate, lentiform nuclei, internal capsule, insula, M1-M3 cortex): 7 - Supraganglionic infarction (M4-M6 cortex): 3 Total score (0-10 with 10 being normal): 10 IMPRESSION: 1. Hypodensity left occipital lobe unchanged from yesterday. This is compatible with infarct of subacute to chronic duration. No hemorrhagic transformation. 2. ASPECTS is 10.  No acute MCA infarct. 3. Atrophy with extensive chronic microvascular ischemic change in the white matter. 4. Code stroke imaging results were communicated on 07/13/2020 at 4:21 pm to provider Bhagat via amion  Electronically Signed   By: Marlan Palau M.D.   On: 07/13/2020 16:23   Telemetry    07/14/20 SB with HR in the high 40's-50's - Personally Reviewed  ECG    No new tracing as of 07/14/20 - Personally Reviewed  Cardiac Studies   Echo 07/11/20:  1. Left ventricular ejection fraction, by estimation, is 55 to 60%. The  left ventricle has normal function. The left ventricle has no regional  wall motion abnormalities. Left ventricular diastolic parameters are  consistent with Grade II diastolic  dysfunction (pseudonormalization).  2. Right ventricular systolic function is normal. The right ventricular  size is normal. There is mildly elevated pulmonary artery systolic  pressure.  3. Left atrial size was mildly dilated.  4. Mild mitral valve regurgitation.  5. Tricuspid valve regurgitation is mild to moderate.  6. The aortic valve is abnormal. Aortic valve regurgitation is mild to  moderate. Mild aortic valve sclerosis is present, with no evidence of  aortic valve stenosis.  7. The inferior vena cava is normal in size with greater than 50%  respiratory variability, suggesting right atrial pressure of 3 mmHg.   Patient Profile     86 y.o. female with a history of COPD, depression, GERD and duodenal ulcer anemia, hypertension, and hypothyroidism now presenting with newly documented SVT requiring adenosine.  Assessment & Plan    1. SVT: -Successfully treated with adenosine  -Initially started on Torpol 12.5 however this was discontinued due to bradycardia with HR's in the 40-50's  -Echo with LVEF at 55-60%, G2DD, mild MR, mild to moderate TR, mid to moderate AR with mild sclerosis and no stenosis  -Plan per Dr. Diona Browner was to transition to pindolol 2.5mg  BID and follow in the OP setting with close OV and 72-H Zio monitor  -Telemetry with no recurrent SVT  2. Elevated hsT: -Flat pattern with peak at 576 consistent with demand ischemia rather than ACS -No chest pain reported   -Stable echocardiogram as above   Signed, Georgie Chard NP-C [facility] Pager: [phone] 07/14/2020, 7:52 AM     For questions or updates, please contact   Please consult [email or web address] for contact info under Cardiology/STEMI.  History and all data above reviewed.  Patient examined.  I agree with the findings as above.  The patient exam reveals COR:RRR  ,  Lungs: Clear  ,  Abd: Positive bowel sounds, no rebound no guarding, Ext no edema  .  All available labs, radiology testing, previous records reviewed. Agree with documented assessment and plan. SVT:  Tolerating pindolol.  No further recommendations.   We will arrange follow up.    Fayrene Fearing [facility]  10:54 AM  07/14/2020

## 2020-07-14 NOTE — Progress Notes (Signed)
  Speech Language Pathology Treatment: Dysphagia  Patient Details Name: April Mccoy MRN: [id number] DOB: 01-27-1929 Today's Date: 07/14/2020 Time: 0867-6195 SLP Time Calculation (min) (ACUTE ONLY): 10 min  Assessment / Plan / Recommendation Clinical Impression  Pt seated in recliner.  She did well feeding herself breakfast after tray set-up per NT. Per SLP observation today, she drank 2 oz water from a cup without difficulty; using a straw continues to elicit more frequent coughing.   She self-fed pudding with no overt difficulty and verbalized her enjoyment. Lung sounds are clear per chart review.  Given prior discussion with April Mccoy, SLP, and pt's daughter on 11/5, Dysphagia 2 diet/thin liquids appears to be the most appropriate diet and aligned with pt/family desires. While her swallowing is not perfect, it is certainly functional and there are no concerns for a debilitating dysphagia.  No further acute care SLP needs are identified nor is f/u needed post-discharge.  Our service will sign off.   HPI HPI: Pt is an 86 y.o. female with medical history significant of anemia, COPD, depression, GERD/history of duodenal ulcer,  CVA, hypertension, hypothyroidism who presents with recent nausea and vomiting and was found to be in SVT by EMS. She received 1 dose of adenosine in the ED which converted her rhythm back to sinus. Per EMS, pt was seen by speech pathology in 2015 and 2016 and has chronic dysphagia with coughing with p.o. intake. Most recent MBS on 01/27/15: mild oral and mild-moderated sensory based dysphagia with delayed swallow initiation to valleculae and pyrirform sinuses. Silent aspiration of thin liquids in pyriform sinuses prematurely. Therapeutic chin tuck was not effective over multiple trials. A dysphagia 3 diet with nectar thick liquids was recommended. Esophagram 01/27/15: narrowing at the esophagogastric junction which restricted passage of barium tablet concerning for a mild stricture.  Mild GERD. Mild intermittent tertiary contractions of the esophagus as can be seen with spasm. CXR 11/4 negative for acute changes.      SLP Plan  All goals met       Recommendations  Diet recommendations: Dysphagia 2 (fine chop);Thin liquid Liquids provided via: Cup;No straw Medication Administration: Crushed with puree Supervision: Patient able to self feed Postural Changes and/or Swallow Maneuvers: Seated upright 90 degrees                Oral Care Recommendations: Oral care BID Follow up Recommendations: None SLP Visit Diagnosis: Dysphagia, unspecified (R13.10) Plan: All goals met       GO                April Mccoy 07/14/2020, 10:16 AM April Mccoy April Mccoy, April Mccoy Office number [phone] Pager [phone]

## 2020-07-14 NOTE — Discharge Summary (Signed)
Physician Discharge Summary  April Mccoy QZR:[id number] DOB: Jan 20, 1929 DOA: 07/10/2020  PCP: Ailene Ravel, MD  Admit date: 07/10/2020 Discharge date: 07/14/2020   Discharge Diagnoses/Plan: Principal Problem:   SVT (supraventricular tachycardia) (HCC): Pt has been started on responded well with metoprolol. TFT check. 07/12/20 Pindolol per Cardiology 2.5 bid.  And hold if HR is 55 and below. 07/13/20 Pt started on pindolol and pt is stable no bradycardia reported.  ZIO patch per cardiology.  GERD (gastroesophageal reflux disease) IV PPI. carafate and zofran. ?esophagitis. gastritis.  07/13/20 Cont ppi therapy. Pt to be d/c on po ppi.  COPD (chronic obstructive pulmonary disease) (HCC) Stable cont duoneb as needed.   Elevated troponin Attribute to demand ischemia. Cardiology following. 07/13/20 Appreciate cardiology Management and mare.   Anemia: Hemoglobin has dropped from 12 to 10.2 We will obtain iron panel c/w ACD and will transfuse as needed. H/h is stable at 11.0  Chronic Gi Bleed: FOBT is positive.  D/w daughter for routine h/h and monitor for dark black stools and any dizziness or  Gross bleeding. She verbalized understanding the limitation of eval for mom and wants her to live life and be comfortable.   B12 def: Level 183 pt given 1000 mcg of cyanocobalamin.   FOBT positive: D/w daughter that this is chronic and is probably contributing and causing strain on her heart and we will start ppi therapy. Pt is not a good candidate for gi eval due to risk with sedation and age.    Discharge Condition:  Good.  Diet recommendation:  Cardiac soft diet .  Filed Weights   07/12/20 0426 07/13/20 0605 07/14/20 0326  Weight: 42.8 kg 39 kg 41.9 kg    Discharge activity: As PT recommendation.   History of present illness:  April Seedorf Gibsonis a 86 y.o.femalewith medical history significant ofanemia, COPD, depression, GERD/history of duodenal ulcer,  hypertension, hypothyroidism who presents with recent nausea and vomiting and was found to be in SVT by EMS. History obtained with assistance of chart review and patient's daughter as she is a difficult historian. She has had some episodes of nausea vomiting this morning that she felt better after vomiting. EMS was called and her heart rate is found to be in the 180s and apparent SVT. She received 1 dose of adenosine in the ED which converted her rhythm back to sinus. She denies chest pain, shortness of breath, abdominal pain, constipation or diarrhea when seen.  ED Course:Vitals in the ED significant for SVT initially which resolved with adenosine as above, otherwise vital signs stable. Lab work showed normal BMP, normal magnesium, normal CBC. LFTs showed mildly low protein and albumin. Troponin up trended from 43-2 38. Respiratory panel was negative. UA and lipase pending. Patient was seen by cardiology in the ED who recommended starting a beta-blocker and observing overnight. Chest x-ray was without acute abnormality and CT abdomen showed chronic changes without acute abnormality.    Hospital Course:  Pt admitted  With Nausea / vomiting and found to have SVT and elevated troponin attributed to demand ischemia. Pt stabilized quickly and monitored over weekend With plan for zio patch on Monday. Pt has had two episode of what was thought  To be slurred speech and AMS for what staff thought was stroke and code stroke was called and found to be negative. I suspect pt is a chronic GI bleed and intermittently  Has hypotension or dehydration that puts strain on her heart and cause her to either have syncope  from low bp or volume or perfusion.Pt is stable and ready for d/c home with home pt and whatever resources she may need.  Procedures: 2 D Echo: 1. Left ventricular ejection fraction, by estimation, is 55 to 60%. The left ventricle has normal function. The left ventricle has no regional wall  motion abnormalities. Left ventricular diastolic parameters are consistent with Grade II diastolic dysfunction (pseudonormalization). 2. Right ventricular systolic function is normal. The right ventricular size is normal. There is mildly elevated pulmonary artery systolic pressure. 3. Left atrial size was mildly dilated. 4. Mild mitral valve regurgitation. 5. Tricuspid valve regurgitation is mild to moderate. 6. The aortic valve is abnormal. Aortic valve regurgitation is mild to moderate. Mild aortic valve sclerosis is present, with no evidence of aortic valve stenosis. 7. The inferior vena cava is normal in size with greater than 50% respiratory variability, suggesting right atrial pressure of 3 mmHg.  Consultations: Cardiology. Neurology  Discharge Exam: Vitals:   07/14/20 0817 07/14/20 1150  BP: 120/69   Pulse: 68 (!) 57  Resp: 18 17  Temp: 98.5 F (36.9 C) 98.3 F (36.8 C)  SpO2: 94%     Physical Exam Vitals and nursing note reviewed.  HENT:     Head: Normocephalic and atraumatic.     Right Ear: External ear normal.     Left Ear: External ear normal.     Nose: Nose normal.  Eyes:     Extraocular Movements: Extraocular movements intact.  Cardiovascular:     Rate and Rhythm: Regular rhythm.     Pulses: Normal pulses.     Heart sounds: Normal heart sounds.  Pulmonary:     Effort: Pulmonary effort is normal.     Breath sounds: Normal breath sounds.  Abdominal:     Palpations: Abdomen is soft.  Musculoskeletal:        General: No swelling or deformity. Normal range of motion.     Right lower leg: No edema.     Left lower leg: No edema.  Skin:    General: Skin is warm.  Neurological:     General: No focal deficit present.     Mental Status: She is alert and oriented to person, place, and time. Mental status is at baseline.  Psychiatric:        Mood and Affect: Mood normal.        Behavior: Behavior normal.    Discharge Instructions   Discharge Instructions     Call MD for:  difficulty breathing, headache or visual disturbances   Complete by: As directed    Call MD for:  extreme fatigue   Complete by: As directed    Call MD for:  persistant dizziness or light-headedness   Complete by: As directed    Call MD for:  persistant nausea and vomiting   Complete by: As directed    Call MD for:  severe uncontrolled pain   Complete by: As directed    Call MD for:  temperature >100.4   Complete by: As directed    Diet - low sodium heart healthy   Complete by: As directed    Increase activity slowly   Complete by: As directed      Allergies as of 07/14/2020   No Known Allergies     Medication List    STOP taking these medications   benazepril 10 MG tablet Commonly known as: LOTENSIN     TAKE these medications   acetaminophen 325 MG tablet Commonly known as:  TYLENOL Take 325 mg by mouth every 6 (six) hours as needed for moderate pain.   albuterol (2.5 MG/3ML) 0.083% nebulizer solution Commonly known as: PROVENTIL Take 3 mLs (2.5 mg total) by nebulization every 6 (six) hours as needed for wheezing or shortness of breath.   aspirin EC 81 MG tablet Take 1 tablet (81 mg total) by mouth daily.   calcium carbonate 1250 (500 Ca) MG tablet Commonly known as: OS-CAL - dosed in mg of elemental calcium Take 1 tablet (500 mg of elemental calcium total) by mouth 2 (two) times daily with a meal.   escitalopram 10 MG tablet Commonly known as: LEXAPRO Take 10 mg by mouth daily.   ferrous sulfate 325 (65 FE) MG tablet Take 325 mg by mouth daily with breakfast.   ipratropium-albuterol 0.5-2.5 (3) MG/3ML Soln Commonly known as: DUONEB Take 3 mLs by nebulization 2 (two) times daily as needed (for breathing).   levothyroxine 50 MCG tablet Commonly known as: SYNTHROID Take 50 mcg by mouth daily.   ondansetron 4 MG tablet Commonly known as: ZOFRAN Take 4 mg by mouth every 8 (eight) hours as needed for nausea or vomiting.   pantoprazole 40 MG  tablet Commonly known as: PROTONIX Take 40 mg by mouth daily.   pindolol 5 MG tablet Commonly known as: VISKEN Take 0.5 tablets (2.5 mg total) by mouth 2 (two) times daily.   protective barrier Crea Apply 1 application topically every other day.   sucralfate 1 g tablet Commonly known as: CARAFATE Take 1 tablet (1 g total) by mouth 4 (four) times daily -  with meals and at bedtime.   Systane Balance 0.6 % Soln Generic drug: Propylene Glycol Apply 1 drop to eye daily as needed (for dry eyes).      No Known Allergies  Follow-up Information    Rosalio Macadamia, NP Follow up on 08/04/2020.   Specialties: Nurse Practitioner, Interventional Cardiology, Cardiology, Radiology Why: at 1045am  Contact information: [address] [place] [address] [phone]        [facility] Follow up.   Why: Physical Therapy- office to call with a visit time. If you have questions, please call [phone]               The results of significant diagnostics from this hospitalization (including imaging, microbiology, ancillary and laboratory) are listed below for reference.    Significant Diagnostic Studies: CT HEAD WO CONTRAST  Result Date: 07/12/2020 CLINICAL DATA:  Vertigo, peripheral EXAM: CT HEAD WITHOUT CONTRAST TECHNIQUE: Contiguous axial images were obtained from the base of the skull through the vertex without intravenous contrast. COMPARISON:  Head CT 04/09/2016 FINDINGS: Brain: Generalized atrophy and chronic small vessel ischemia with progression from 2017. Cerebellar atrophy is not significantly changed. There is encephalomalacia in the left parietooccipital cortex that was not seen on prior exam, likely represents interval but remote infarct. There is no evidence of acute ischemia. No hemorrhage. No subdural or extra-axial collection. No hydrocephalus, midline shift, or mass effect. Vascular: Atherosclerosis of skullbase vasculature without  hyperdense vessel or abnormal calcification. Skull: No fracture or focal lesion. Sinuses/Orbits: Paranasal sinuses and mastoid air cells are clear. The visualized orbits are unremarkable. Bilateral cataract resection. Other: None. IMPRESSION: 1. No acute intracranial abnormality. 2. Generalized atrophy and chronic small vessel ischemia with progression from 2017. Remote left parietooccipital infarct. Electronically Signed   By: Narda Rutherford M.D.   On: 07/12/2020 18:53   CT ABDOMEN PELVIS W CONTRAST  Result Date: 07/10/2020 CLINICAL DATA:  Nausea and vomiting EXAM: CT ABDOMEN AND PELVIS WITH CONTRAST TECHNIQUE: Multidetector CT imaging of the abdomen and pelvis was performed using the standard protocol following bolus administration of intravenous contrast. CONTRAST:  75mL OMNIPAQUE IOHEXOL 300 MG/ML  SOLN COMPARISON:  None. FINDINGS: Lower chest: Lung bases demonstrate some mild peribronchial thickening as well as mild left basilar atelectasis. Hepatobiliary: Fatty infiltration of the liver is noted. The gallbladder appears within normal limits. Pancreas: Unremarkable. No pancreatic ductal dilatation or surrounding inflammatory changes. Spleen: Normal in size without focal abnormality. Adrenals/Urinary Tract: Adrenal glands are within normal limits. Kidneys demonstrate a normal enhancement pattern bilaterally. Small lower pole right renal cyst and left upper pole renal cyst are noted. No calculi are seen. No obstructive changes are noted. The bladder is partially distended. Stomach/Bowel: No obstructive or inflammatory changes of the colon are noted. The appendix is not well visualized although no inflammatory changes are seen to suggest appendicitis. Small bowel and stomach appear within normal limits. Vascular/Lymphatic: Atherosclerotic calcifications of the abdominal aorta are noted. No aneurysmal dilatation is seen. No significant lymphadenopathy is noted. Reproductive: Status post hysterectomy. No  adnexal masses. Other: No abdominal wall hernia or abnormality. No abdominopelvic ascites. Musculoskeletal: Postsurgical changes in the proximal femurs are noted bilaterally. Generalized osteopenia is seen. Compression deformities are noted at L2, L3 and L4 as well as T11 and T12. These are stable from a prior plain film examination from February of 2020 IMPRESSION: Mild left basilar atelectasis. Bilateral renal cysts. Chronic compression deformities in the lumbar spine and thoracic spine Aortic Atherosclerosis (ICD10-I70.0). Electronically Signed   By: Alcide Clever M.D.   On: 07/10/2020 17:17   DG Chest Portable 1 View  Result Date: 07/10/2020 CLINICAL DATA:  Supraventricular tachycardia. EXAM: PORTABLE CHEST 1 VIEW COMPARISON:  April 22, 2020. FINDINGS: Stable cardiomediastinal silhouette. No pneumothorax or pleural effusion is noted. Both lungs are clear. The visualized skeletal structures are unremarkable. IMPRESSION: No active disease. Aortic Atherosclerosis (ICD10-I70.0). Electronically Signed   By: Lupita Raider M.D.   On: 07/10/2020 13:26   ECHOCARDIOGRAM COMPLETE  Result Date: 07/11/2020    ECHOCARDIOGRAM REPORT   Patient Name:   SHIAH BERHOW Date of Exam: 07/11/2020 Medical Rec #:  [id number]       Height:       59.0 in Accession #:    [id number]      Weight:       87.1 lb Date of Birth:  [birth year]        BSA:          1.295 m Patient Age:    89 years        BP:           153/75 mmHg Patient Gender: F               HR:           55 bpm. Exam Location:  Inpatient Procedure: 2D Echo, Color Doppler and Cardiac Doppler Indications:    R94.31 Abnormal EKG  History:        Patient has no prior history of Echocardiogram examinations.                 Stroke and COPD, Signs/Symptoms:Shortness of Breath and Dyspnea;                 Risk Factors:Current Smoker and Hypertension.  Sonographer:    Eulah Pont RDCS Referring Phys: [id number] Tanna Savoy [facility] IMPRESSIONS  1. Left ventricular ejection  fraction, by estimation, is 55 to 60%. The left ventricle has normal function. The left ventricle has no regional wall motion abnormalities. Left ventricular diastolic parameters are consistent with Grade II diastolic dysfunction (pseudonormalization).  2. Right ventricular systolic function is normal. The right ventricular size is normal. There is mildly elevated pulmonary artery systolic pressure.  3. Left atrial size was mildly dilated.  4. Mild mitral valve regurgitation.  5. Tricuspid valve regurgitation is mild to moderate.  6. The aortic valve is abnormal. Aortic valve regurgitation is mild to moderate. Mild aortic valve sclerosis is present, with no evidence of aortic valve stenosis.  7. The inferior vena cava is normal in size with greater than 50% respiratory variability, suggesting right atrial pressure of 3 mmHg. FINDINGS  Left Ventricle: Left ventricular ejection fraction, by estimation, is 55 to 60%. The left ventricle has normal function. The left ventricle has no regional wall motion abnormalities. The left ventricular internal cavity size was normal in size. There is  no left ventricular hypertrophy. Left ventricular diastolic parameters are consistent with Grade II diastolic dysfunction (pseudonormalization). Right Ventricle: The right ventricular size is normal. No increase in right ventricular wall thickness. Right ventricular systolic function is normal. There is mildly elevated pulmonary artery systolic pressure. The tricuspid regurgitant velocity is 2.96  m/s, and with an assumed right atrial pressure of 3 mmHg, the estimated right ventricular systolic pressure is 38.0 mmHg. Left Atrium: Left atrial size was mildly dilated. Right Atrium: Right atrial size was normal in size. Pericardium: There is no evidence of pericardial effusion. Mitral Valve: The mitral valve is abnormal. There is mild thickening of the mitral valve leaflet(s). Mild mitral valve regurgitation. Tricuspid Valve: The  tricuspid valve is normal in structure. Tricuspid valve regurgitation is mild to moderate. Aortic Valve: The aortic valve is abnormal. Aortic valve regurgitation is mild to moderate. Aortic regurgitation PHT measures 727 msec. Mild aortic valve sclerosis is present, with no evidence of aortic valve stenosis. Pulmonic Valve: The pulmonic valve was not well visualized. Pulmonic valve regurgitation is mild. Aorta: The aortic root is normal in size and structure. Venous: The inferior vena cava is normal in size with greater than 50% respiratory variability, suggesting right atrial pressure of 3 mmHg. IAS/Shunts: No atrial level shunt detected by color flow Doppler.  LEFT VENTRICLE PLAX 2D LVIDd:         3.70 cm  Diastology LVIDs:         2.90 cm  LV e' medial:    4.58 cm/s LV PW:         0.80 cm  LV E/e' medial:  17.7 LV IVS:        0.80 cm  LV e' lateral:   5.10 cm/s LVOT diam:     1.80 cm  LV E/e' lateral: 15.9 LV SV:         62 LV SV Index:   48 LVOT Area:     2.54 cm  RIGHT VENTRICLE RV S prime:     7.29 cm/s TAPSE (M-mode): 1.4 cm LEFT ATRIUM           Index       RIGHT ATRIUM           Index LA diam:      2.90 cm 2.24 cm/m  RA Area:     11.20 cm LA Vol (A2C): 51.1 ml 39.45 ml/m RA Volume:   27.00 ml  20.84 ml/m LA Vol (A4C): 32.7  ml 25.24 ml/m  AORTIC VALVE LVOT Vmax:   76.50 cm/s LVOT Vmean:  52.400 cm/s LVOT VTI:    0.245 m AI PHT:      727 msec  AORTA Ao Root diam: 3.30 cm MITRAL VALVE               TRICUSPID VALVE MV Area (PHT): 2.87 cm    TR Peak grad:   35.0 mmHg MV Decel Time: 264 msec    TR Vmax:        296.00 cm/s MV E velocity: 81.00 cm/s MV A velocity: 73.20 cm/s  SHUNTS MV E/A ratio:  1.11        Systemic VTI:  0.24 m                            Systemic Diam: 1.80 cm Dietrich Pates MD Electronically signed by Dietrich Pates MD Signature Date/Time: 07/11/2020/3:24:21 PM    Final    CT HEAD CODE STROKE WO CONTRAST  Result Date: 07/13/2020 CLINICAL DATA:  Code stroke.  Acute neuro deficit.  Blurred  vision EXAM: CT HEAD WITHOUT CONTRAST TECHNIQUE: Contiguous axial images were obtained from the base of the skull through the vertex without intravenous contrast. COMPARISON:  CT head 07/12/2020 FINDINGS: Brain: 2 cm hypodensity in the left occipital lobe unchanged. This is compatible with infarct and could be subacute or chronic. No associated hemorrhage or extension. Generalized moderate atrophy. Extensive chronic white matter changes are stable. Negative for intracranial hemorrhage. Vascular: Negative for hyperdense vessel Skull: Negative Sinuses/Orbits: Paranasal sinuses clear. Bilateral cataract extraction Other: None ASPECTS ([place] Stroke Program Early CT Score) - Ganglionic level infarction (caudate, lentiform nuclei, internal capsule, insula, M1-M3 cortex): 7 - Supraganglionic infarction (M4-M6 cortex): 3 Total score (0-10 with 10 being normal): 10 IMPRESSION: 1. Hypodensity left occipital lobe unchanged from yesterday. This is compatible with infarct of subacute to chronic duration. No hemorrhagic transformation. 2. ASPECTS is 10.  No acute MCA infarct. 3. Atrophy with extensive chronic microvascular ischemic change in the white matter. 4. Code stroke imaging results were communicated on 07/13/2020 at 4:21 pm to provider Bhagat via amion Electronically Signed   By: Marlan Palau M.D.   On: 07/13/2020 16:23    Microbiology: Recent Results (from the past 240 hour(s))  Respiratory Panel by RT PCR (Flu A&B, Covid) - Nasopharyngeal Swab     Status: None   Collection Time: 07/10/20  3:56 PM   Specimen: Nasopharyngeal Swab  Result Value Ref Range Status   SARS Coronavirus 2 by RT PCR NEGATIVE NEGATIVE Final    Comment: (NOTE) SARS-CoV-2 target nucleic acids are NOT DETECTED.  The SARS-CoV-2 RNA is generally detectable in upper respiratoy specimens during the acute phase of infection. The lowest concentration of SARS-CoV-2 viral copies this assay can detect is 131 copies/mL. A negative result does  not preclude SARS-Cov-2 infection and should not be used as the sole basis for treatment or other patient management decisions. A negative result may occur with  improper specimen collection/handling, submission of specimen other than nasopharyngeal swab, presence of viral mutation(s) within the areas targeted by this assay, and inadequate number of viral copies (<131 copies/mL). A negative result must be combined with clinical observations, patient history, and epidemiological information. The expected result is Negative.  Fact Sheet for Patients:  https://www.moore.com/  Fact Sheet for Healthcare Providers:  https://www.young.biz/  This test is no t yet approved or cleared by the [place] FDA  and  has been authorized for detection and/or diagnosis of SARS-CoV-2 by FDA under an Emergency Use Authorization (EUA). This EUA will remain  in effect (meaning this test can be used) for the duration of the COVID-19 declaration under Section 564(b)(1) of the Act, 21 U.S.C. section 360bbb-3(b)(1), unless the authorization is terminated or revoked sooner.     Influenza A by PCR NEGATIVE NEGATIVE Final   Influenza B by PCR NEGATIVE NEGATIVE Final    Comment: (NOTE) The Xpert Xpress SARS-CoV-2/FLU/RSV assay is intended as an aid in  the diagnosis of influenza from Nasopharyngeal swab specimens and  should not be used as a sole basis for treatment. Nasal washings and  aspirates are unacceptable for Xpert Xpress SARS-CoV-2/FLU/RSV  testing.  Fact Sheet for Patients: https://www.moore.com/  Fact Sheet for Healthcare Providers: https://www.young.biz/  This test is not yet approved or cleared by the [place] FDA and  has been authorized for detection and/or diagnosis of SARS-CoV-2 by  FDA under an Emergency Use Authorization (EUA). This EUA will remain  in effect (meaning this test can be used) for the  duration of the  Covid-19 declaration under Section 564(b)(1) of the Act, 21  U.S.C. section 360bbb-3(b)(1), unless the authorization is  terminated or revoked. Performed at [facility], [address], [place], [place] [address]      Labs: Basic Metabolic Panel: Recent Labs  Lab 07/10/20 1248 07/11/20 0223 07/12/20 0058 07/12/20 0926 07/13/20 1643  NA 141 141  --  139 139  K 3.9 3.9  --  4.2 4.3  CL 108 108  --  105 104  CO2 26 25  --  25 26  GLUCOSE 136* 80  --  97 98  BUN 20 17  --  15 13  CREATININE 0.92 0.68  --  0.70 0.79  CALCIUM 10.2 9.6  --  9.7 10.3  MG 2.1  --  1.9  --  2.0  PHOS  --   --  2.9  --  2.6   Liver Function Tests: Recent Labs  Lab 07/10/20 1248 07/11/20 0223 07/12/20 0926  AST ALT ALKPHOS 80 66 70  BILITOT 0.7 0.7 0.6  PROT 5.7* 4.8* 5.1*  ALBUMIN 3.2* 2.9* 3.0*   Recent Labs  Lab 07/10/20 1248  LIPASE 35   No results for input(s): AMMONIA in the last 168 hours. CBC: Recent Labs  Lab 07/10/20 1248 07/11/20 0223 07/12/20 0926 07/13/20 1643  WBC 5.9 4.4 4.5 5.3  NEUTROABS 3.9  --  3.0 2.9  HGB 12.0 10.2* 11.7* 11.0*  HCT 40.0 34.5* 38.3 36.2  MCV 93.7 93.0 91.0 90.0  PLT 200 158 161 184   Cardiac Enzymes: No results for input(s): CKTOTAL, CKMB, CKMBINDEX, TROPONINI in the last 168 hours. BNP: BNP (last 3 results) No results for input(s): BNP in the last 8760 hours.  ProBNP (last 3 results) No results for input(s): PROBNP in the last 8760 hours.  CBG: Recent Labs  Lab 07/12/20 1643  GLUCAP 151*      Time spent: 20 minutes  Signed:  Gertha Calkin MD.  [facility] 07/14/2020, 3:31 PM

## 2020-07-14 NOTE — Progress Notes (Signed)
Occupational Therapy Treatment Patient Details Name: April Mccoy MRN: [id number] DOB: 11-19-1928 Today's Date: 07/14/2020    History of present illness Pt is an 86 y/o female admitted secondary to nausea and vomiting. Found to have SVT. PMH includes COPD, HTN and CVA.    OT comments  Pt progressing to OOB ADL to [facility] with maxA for toilet hygiene; transfers with minA +2 for stability. Pt performing increased ability to follow commands and perform stand pivot to recliner with minA. Pt would benefit from continued OT skilled services. OT following acutely.     Follow Up Recommendations  Home health OT;Supervision/Assistance - 24 hour    Equipment Recommendations  3 in 1 bedside commode    Recommendations for Other Services      Precautions / Restrictions Precautions Precautions: Fall Restrictions Weight Bearing Restrictions: No       Mobility Bed Mobility Overal bed mobility: Needs Assistance Bed Mobility: Supine to Sit     Supine to sit: Min guard     General bed mobility comments: Min guard for safety with transition to edge of bed, HOB elevated  Transfers Overall transfer level: Needs assistance Equipment used: Rolling walker (2 wheeled);2 person hand held assist Transfers: Sit to/from [employer] Sit to Stand: Min assist;+2 safety/equipment Stand pivot transfers: Min assist;+2 safety/equipment       General transfer comment: handheld assist to EOB; use of RW to [facility] and to recliner    Balance Overall balance assessment: Needs assistance Sitting-balance support: No upper extremity supported;Feet supported Sitting balance-Leahy Scale: Fair     Standing balance support: Bilateral upper extremity supported;During functional activity Standing balance-Leahy Scale: Poor Standing balance comment: Reliant on BUE support and external support.                            ADL either performed or assessed with clinical judgement   ADL  Overall ADL's : Needs assistance/impaired Eating/Feeding: Set up;Sitting Eating/Feeding Details (indicate cue type and reason): assist for cutting plate of food Grooming: Set up;Sitting Grooming Details (indicate cue type and reason): combing hair; assist to place in ponytail             Lower Body Dressing: Minimal assistance;Sitting/lateral leans;Sit to/from stand;Cueing for safety Lower Body Dressing Details (indicate cue type and reason): reaching BLEs to fix socks Toilet Transfer: Minimal assistance;+2 for physical assistance;+2 for safety/equipment;Stand-pivot;BSC;RW Toilet Transfer Details (indicate cue type and reason): pt initiating movement Toileting- Clothing Manipulation and Hygiene: Maximal assistance;Sit to/from stand Toileting - Clothing Manipulation Details (indicate cue type and reason): assist for perineal care in standing     Functional mobility during ADLs: Minimal assistance;+2 for physical assistance;+2 for safety/equipment;Cueing for safety General ADL Comments: Pt initiating movement with cues. Pt tolerating session well with very little fatigue.     Vision       Perception     Praxis      Cognition Arousal/Alertness: Awake/alert Behavior During Therapy: WFL for tasks assessed/performed Overall Cognitive Status: No family/caregiver present to determine baseline cognitive functioning Area of Impairment: Safety/judgement;Memory;Orientation                 Orientation Level: Disoriented to;Place;Time;Situation   Memory: Decreased short-term memory   Safety/Judgement: Decreased awareness of safety;Decreased awareness of deficits     General Comments: Pt pleasantly confused, follows one step commands and attempts to assist with all tasks        Exercises  Shoulder Instructions       General Comments VSS on RA    Pertinent Vitals/ Pain       Pain Assessment: Faces Faces Pain Scale: No hurt  Home Living                                           Prior Functioning/Environment              Frequency  Min 2X/week        Progress Toward Goals  OT Goals(current goals can now be found in the care plan section)  Progress towards OT goals: Progressing toward goals  Acute Rehab OT Goals Patient Stated Goal: to get to the chair OT Goal Formulation: With patient Time For Goal Achievement: 07/25/20 Potential to Achieve Goals: Good ADL Goals Pt Will Perform Lower Body Bathing: with min assist;sitting/lateral leans;sit to/from stand Pt Will Perform Lower Body Dressing: with min assist;sitting/lateral leans;sit to/from stand Pt Will Transfer to Toilet: with min assist;squat pivot transfer Pt Will Perform Tub/Shower Transfer: with min assist;Squat pivot transfer;shower seat;grab bars  Plan Discharge plan remains appropriate    Co-evaluation    PT/OT/SLP Co-Evaluation/Treatment: Yes Reason for Co-Treatment: For patient/therapist safety;To address functional/ADL transfers   OT goals addressed during session: ADL's and self-care      AM-PAC OT "6 Clicks" Daily Activity     Outcome Measure   Help from another person eating meals?: A Little Help from another person taking care of personal grooming?: A Little Help from another person toileting, which includes using toliet, bedpan, or urinal?: A Lot Help from another person bathing (including washing, rinsing, drying)?: A Lot Help from another person to put on and taking off regular upper body clothing?: A Little Help from another person to put on and taking off regular lower body clothing?: A Lot 6 Click Score: 15    End of Session Equipment Utilized During Treatment: Gait belt;Rolling walker  OT Visit Diagnosis: Unsteadiness on feet (R26.81);Muscle weakness (generalized) (M62.81)   Activity Tolerance Patient tolerated treatment well   Patient Left in chair;with call bell/phone within reach;with chair alarm set   Nurse Communication  Mobility status        Time: 6503-5465 OT Time Calculation (min): 30 min  Charges: OT General Charges $OT Visit: 1 Visit OT Treatments $Self Care/Home Management : 8-22 mins  Flora Lipps, OTR/L Acute Rehabilitation Services Pager: [phone] Office: [phone]    Kirin Brandenburger C 07/14/2020, 4:08 PM

## 2020-07-14 NOTE — Progress Notes (Signed)
Physical Therapy Treatment Patient Details Name: April Mccoy MRN: [id number] DOB: [birth year] Today's Date: 07/14/2020    History of Present Illness Pt is an 86 y/o female admitted secondary to nausea and vomiting. Found to have SVT. PMH includes COPD, HTN and CVA.     PT Comments    Pt pleasantly confused, following all one step commands. Requiring min assist (+2 safety) for transfers and limited ambulation using handheld assist and rolling walker. Pt likely close to her functional baseline where she performs transfers only and requires assist for ADL's. D/c plan remains appropriate.     Follow Up Recommendations  Home health PT;Supervision/Assistance - 24 hour     Equipment Recommendations  None recommended by PT    Recommendations for Other Services       Precautions / Restrictions Precautions Precautions: Fall Restrictions Weight Bearing Restrictions: No    Mobility  Bed Mobility Overal bed mobility: Needs Assistance Bed Mobility: Supine to Sit     Supine to sit: Min guard     General bed mobility comments: Min guard for safety with transition to edge of bed, HOB elevated  Transfers Overall transfer level: Needs assistance Equipment used: Rolling walker (2 wheeled);2 person hand held assist Transfers: Sit to/from [employer] Sit to Stand: Min assist;+2 safety/equipment Stand pivot transfers: Min assist;+2 safety/equipment       General transfer comment:  MinA (+2 safety) for standing from edge of bed via HHA and pivoting towards right onto [facility]. Mild shakiness noted  Ambulation/Gait Ambulation/Gait assistance: Min assist;+2 safety/equipment Gait Distance (Feet): 3 Feet Assistive device: Rolling walker (2 wheeled) Gait Pattern/deviations: Step-through pattern;Decreased stride length;Trunk flexed;Narrow base of support Gait velocity: decreased Gait velocity interpretation: <1.31 ft/sec, indicative of household ambulator General Gait  Details: Pt taking pivotal steps from [facility] to recliner with walker, assist for balance and line management   Stairs             Wheelchair Mobility    Modified Rankin (Stroke Patients Only)       Balance Overall balance assessment: Needs assistance Sitting-balance support: No upper extremity supported;Feet supported Sitting balance-Leahy Scale: Fair     Standing balance support: Bilateral upper extremity supported;During functional activity Standing balance-Leahy Scale: Poor Standing balance comment: Reliant on BUE support and external support.                             Cognition Arousal/Alertness: Awake/alert Behavior During Therapy: WFL for tasks assessed/performed Overall Cognitive Status: No family/caregiver present to determine baseline cognitive functioning Area of Impairment: Safety/judgement;Memory;Orientation                 Orientation Level: Disoriented to;Place;Time;Situation   Memory: Decreased short-term memory   Safety/Judgement: Decreased awareness of safety;Decreased awareness of deficits     General Comments: Pt pleasantly confused, follows one step commands and attempts to assist with all tasks      Exercises      General Comments        Pertinent Vitals/Pain Pain Assessment: Faces Faces Pain Scale: No hurt    Home Living                      Prior Function            PT Goals (current goals can now be found in the care plan section) Acute Rehab PT Goals Patient Stated Goal: to get to the chair Potential to Achieve  Goals: Fair Progress towards PT goals: Progressing toward goals    Frequency    Min 3X/week      PT Plan Current plan remains appropriate    Co-evaluation PT/OT/SLP Co-Evaluation/Treatment: Yes Reason for Co-Treatment: For patient/therapist safety;To address functional/ADL transfers PT goals addressed during session: Mobility/safety with mobility        AM-PAC PT "6 Clicks"  Mobility   Outcome Measure  Help needed turning from your back to your side while in a flat bed without using bedrails?: None Help needed moving from lying on your back to sitting on the side of a flat bed without using bedrails?: A Little Help needed moving to and from a bed to a chair (including a wheelchair)?: A Little Help needed standing up from a chair using your arms (e.g., wheelchair or bedside chair)?: A Little Help needed to walk in hospital room?: A Little Help needed climbing 3-5 steps with a railing? : A Lot 6 Click Score: 18    End of Session Equipment Utilized During Treatment: Gait belt Activity Tolerance: Patient tolerated treatment well Patient left: in chair;with call bell/phone within reach;with chair alarm set Nurse Communication: Mobility status PT Visit Diagnosis: Unsteadiness on feet (R26.81);Muscle weakness (generalized) (M62.81);Difficulty in walking, not elsewhere classified (R26.2)     Time: 5035-4656 PT Time Calculation (min) (ACUTE ONLY): 25 min  Charges:  $Therapeutic Activity: 8-22 mins                     Lillia Pauls, PT, DPT Acute Rehabilitation Services Pager [phone] Office [phone]    Norval Morton 07/14/2020, 10:29 AM

## 2020-07-14 NOTE — TOC Initial Note (Signed)
Transition of Care [facility]) - Initial/Assessment Note    Patient Details  Name: April Mccoy MRN: [id number] Date of Birth: 01/09/1929  Transition of [facility]) CM/SW Contact:    Gala Lewandowsky, RN Phone Number: 07/14/2020, 3:03 PM  Clinical Narrative:  Patient presented for SVT- cardiology has been following. Prior to arrival patient was from home alone with the support of her daughter. Patient lives in an apartment and the daughter serves as her home health aide. Patient has durable medical equipment wheelchair and rolling walker at home. Case Manager received permission to call the daughter and the daughter is agreeable to home health physical therapy with 24 hour supervision. Daughter states she can utilize her siblings to care for mother 24 hours. No durable medical equipment needed at this time. Case Manager reached out to physician to see if patient was medically stable to transition home today.                Expected Discharge Plan: Home w Home Health Services Barriers to Discharge: No Barriers Identified   Patient Goals and CMS Choice Patient states their goals for this hospitalization and ongoing recovery are:: to return home   Choice offered to / list presented to :  (daughter has used [facility] in the past and requested agency.)  Expected Discharge Plan and Services Expected Discharge Plan: Home w Home Health Services In-house Referral: NA Discharge Planning Services: CM Consult Post Acute Care Choice: Home Health Living arrangements for the past 2 months: Apartment                 DME Arranged: N/A DME Agency: NA       HH Arranged: PT HH Agency: [facility] Date HH Agency Contacted: 07/14/20 Time HH Agency Contacted: 1454 Representative spoke with at [facility] Agency: Erin  Prior Living Arrangements/Services Living arrangements for the past 2 months: Apartment Lives with:: Self Patient language and need for interpreter reviewed::  Yes Do you feel safe going back to the place where you live?: Yes      Need for Family Participation in Patient Care: Yes (Comment) Care giver support system in place?: Yes (comment) Current home services: Homehealth aide (daughter assists the patient as the aide.) Criminal Activity/Legal Involvement Pertinent to Current Situation/Hospitalization: No - Comment as needed  Activities of Daily Living Home Assistive Devices/Equipment: Wheelchair, Grab bars in shower, Shower chair with back ADL Screening (condition at time of admission) Patient's cognitive ability adequate to safely complete daily activities?: No Is the patient deaf or have difficulty hearing?: Yes Does the patient have difficulty seeing, even when wearing glasses/contacts?: Yes Does the patient have difficulty concentrating, remembering, or making decisions?: Yes Patient able to express need for assistance with ADLs?: Yes Does the patient have difficulty dressing or bathing?: No Independently performs ADLs?: No Communication: Independent Dressing (OT): Needs assistance Is this a change from baseline?: Pre-admission baseline Grooming: Needs assistance Is this a change from baseline?: Pre-admission baseline Feeding: Needs assistance Is this a change from baseline?: Pre-admission baseline Bathing: Needs assistance Is this a change from baseline?: Pre-admission baseline Toileting: Needs assistance Is this a change from baseline?: Pre-admission baseline In/Out Bed: Needs assistance Is this a change from baseline?: Pre-admission baseline Walks in Home: Needs assistance Is this a change from baseline?: Pre-admission baseline Does the patient have difficulty walking or climbing stairs?: Yes Weakness of Legs: Both Weakness of Arms/Hands: None  Permission Sought/Granted Permission sought to share information with : Family Supports, Chief of Staff  Representative, Case Manager Permission granted to share information with :  Yes, Verbal Permission Granted     Permission granted to share info w AGENCY: [facility]        Emotional Assessment Appearance:: Appears stated age Attitude/Demeanor/Rapport: Gracious Affect (typically observed): Appropriate Orientation: : Oriented to Self Alcohol / Substance Use: Not Applicable Psych Involvement: No (comment)  Admission diagnosis:  SVT (supraventricular tachycardia) (HCC) [I47.1] Elevated troponin [R77.8] Patient Active Problem List   Diagnosis Date Noted  . SVT (supraventricular tachycardia) (HCC)   . Elevated troponin   . Iron deficiency anemia 10/20/2015  . Panlobular emphysema (HCC)   . Esophageal ulcer 01/29/2015  . Duodenal ulcer 01/29/2015  . Esophageal dysmotility 01/29/2015  . Esophageal ulceration   . Hypoxia 01/25/2015  . Nausea and vomiting 01/25/2015  . Hypoxemia 01/25/2015  . Fracture of multiple ribs 08/14/2014  . Osteopenia 08/14/2014  . Chest pain 08/13/2014  . GERD (gastroesophageal reflux disease) 08/13/2014  . Anemia 08/13/2014  . Depression 08/13/2014  . History of stroke 08/13/2014  . Hypertension 08/13/2014  . Thyroid disease 08/13/2014  . COPD (chronic obstructive pulmonary disease) (HCC) 08/13/2014  . Dysphagia 08/13/2014  . Right flank pain 08/13/2014  . Mid back pain 08/13/2014  . Abnormality of gait 08/13/2014  . Tobacco abuse 08/13/2014   PCP:  Ailene Ravel, MD Pharmacy:   [facility] [address]  - [address] AT [facility] [address] [place] [address] Phone: [phone] Fax: [phone]     Social Determinants of Health (SDOH) Interventions    Readmission Risk Interventions No flowsheet data found.

## 2020-07-15 ENCOUNTER — Encounter: Payer: Self-pay | Admitting: *Deleted

## 2020-07-15 NOTE — Progress Notes (Signed)
Patient ID: April Mccoy, female   DOB: 29-Oct-1928, 86 y.o.   MRN: [id number] Patient enrolled for [employer] to ship a 14 day ZIO AT long term monitor-Live Telemetry to her home.  Letter with instructions mailed to patient.

## 2020-07-18 ENCOUNTER — Ambulatory Visit (INDEPENDENT_AMBULATORY_CARE_PROVIDER_SITE_OTHER): Payer: Medicare Other

## 2020-07-18 DIAGNOSIS — I471 Supraventricular tachycardia: Secondary | ICD-10-CM | POA: Diagnosis not present

## 2020-07-21 NOTE — Progress Notes (Deleted)
CARDIOLOGY OFFICE NOTE  Date:  07/21/2020    [name] Date of Birth: 11-05-1928 Medical Record #[id number]  PCP:  Ailene Ravel, MD  Cardiologist:  Excell Seltzer (NEW) - saw Katrinka Blazing in May 2020  No chief complaint on file.   History of Present Illness: April Mccoy is a 86 y.o. female who presents today for a post hospital visit. Seen for Dr. Excell Seltzer (NEW) - saw Dr. Katrinka Blazing in May of 2020.   She has a history of anemia, COPD, depression, GERD/history of duodenal ulcer, HTN, and hypothyroidism.   Presented back in May of 2020 with NSTEMI. Seen by Dr. Katrinka Blazing at that time - admitted with failure to thrive and falls. Troponin mildly elevated. Demented. Not a candidate for invasive or interventional cardiology procedures. Her troponins did not suggest ACS.   Presented back to the ER earlier this month per EMS N/V and found to have SVT. Difficult historian. HR found to be in the 180's. Treated with Adenosine. Beta blocker was started.  Was to have outpatient Zio. 2 spells of slurred speech and AMS noted - also concern for chronic GI bleed.     Comes in today. Here with   Past Medical History:  Diagnosis Date  . Abnormality of gait   . Anemia   . Bowel obstruction (HCC)   . COPD (chronic obstructive pulmonary disease) (HCC)   . Depression   . Fracture of coccyx (HCC)    history  . GERD (gastroesophageal reflux disease)   . Hard of hearing   . Hypertension   . Left shoulder pain    DJD  . Shortness of breath dyspnea   . Stroke [facility])    with h/o dysphagia post stroke  . SVT (supraventricular tachycardia) (HCC) 07/2020  . Thyroid disease   . Tobacco abuse   . UTI (lower urinary tract infection)    Enterobacter aerogenes  . Wheelchair bound     Past Surgical History:  Procedure Laterality Date  . ABDOMINAL HYSTERECTOMY    . BALLOON DILATION N/A 01/28/2015   Procedure: BALLOON DILATION;  Surgeon: Beverley Fiedler, MD;  Location: [facility] ENDOSCOPY;  Service: Endoscopy;   Laterality: N/A;  . CATARACT EXTRACTION    . COLONOSCOPY WITH PROPOFOL N/A 10/21/2015   Procedure: COLONOSCOPY WITH PROPOFOL;  Surgeon: Sherrilyn Rist, MD;  Location: [facility] ENDOSCOPY;  Service: Gastroenterology;  Laterality: N/A;  . ESOPHAGOGASTRODUODENOSCOPY N/A 01/28/2015   Procedure: ESOPHAGOGASTRODUODENOSCOPY (EGD);  Surgeon: Beverley Fiedler, MD;  Location: [facility] ENDOSCOPY;  Service: Endoscopy;  Laterality: N/A;  . ESOPHAGOGASTRODUODENOSCOPY (EGD) WITH PROPOFOL N/A 10/21/2015   Procedure: ESOPHAGOGASTRODUODENOSCOPY (EGD) WITH PROPOFOL;  Surgeon: Sherrilyn Rist, MD;  Location: [facility] ENDOSCOPY;  Service: Gastroenterology;  Laterality: N/A;  . GASTRECTOMY  In her 40s versus 50s.   for ulcer.  No details as to the extent of surgery.  Marland Kitchen HERNIA REPAIR    . HIP FRACTURE SURGERY    . JOINT REPLACEMENT     b/l hips     Medications: No outpatient medications have been marked as taking for the 08/04/20 encounter (Appointment) with Rosalio Macadamia, NP.     Allergies: No Known Allergies  Social History: The patient  reports that she quit smoking about 6 years ago. Her smoking use included cigarettes. She has quit using smokeless tobacco. She reports that she does not drink alcohol and does not use drugs.   Family History: The patient's ***family history includes Alcohol abuse in her father;  COPD in her son; Cancer in an other family member; Diabetes in her son; Heart Problems in her daughter; Heart disease in her mother; Hypertension in her mother.   Review of Systems: Please see the history of present illness.   All other systems are reviewed and negative.   Physical Exam: VS:  There were no vitals taken for this visit. Marland Kitchen  BMI There is no height or weight on file to calculate BMI.  Wt Readings from Last 3 Encounters:  07/14/20 92 lb 6 oz (41.9 kg)  01/27/19 87 lb 1.3 oz (39.5 kg)  10/28/18 85 lb (38.6 kg)    General: Pleasant. Well developed, well nourished and in no acute distress.    HEENT: Normal.  Neck: Supple, no JVD, carotid bruits, or masses noted.  Cardiac: ***Regular rate and rhythm. No murmurs, rubs, or gallops. No edema.  Respiratory:  Lungs are clear to auscultation bilaterally with normal work of breathing.  GI: Soft and nontender.  MS: No deformity or atrophy. Gait and ROM intact.  Skin: Warm and dry. Color is normal.  Neuro:  Strength and sensation are intact and no gross focal deficits noted.  Psych: Alert, appropriate and with normal affect.   LABORATORY DATA:  EKG:  EKG {ACTION; IS/IS JJH:[id number]} ordered today.  Personally reviewed by me. This demonstrates ***.  Lab Results  Component Value Date   WBC 5.3 07/13/2020   HGB 11.0 (L) 07/13/2020   HCT 36.2 07/13/2020   PLT 184 07/13/2020   GLUCOSE 98 07/13/2020   CHOL 192 08/13/2014   TRIG 133 08/13/2014   HDL 41 08/13/2014   LDLCALC 124 (H) 08/13/2014   ALT 14 07/12/2020   AST 19 07/12/2020   NA 139 07/13/2020   K 4.3 07/13/2020   CL 104 07/13/2020   CREATININE 0.79 07/13/2020   BUN 13 07/13/2020   CO2 26 07/13/2020   TSH 1.329 07/11/2020   INR 0.98 10/20/2015   HGBA1C 5.9 (H) 08/13/2014       BNP (last 3 results) No results for input(s): BNP in the last 8760 hours.  ProBNP (last 3 results) No results for input(s): PROBNP in the last 8760 hours.   Other Studies Reviewed Today: Procedures: 2 D Echo: 1. Left ventricular ejection fraction, by estimation, is 55 to 60%. The left ventricle has normal function. The left ventricle has no regional wall motion abnormalities. Left ventricular diastolic parameters are consistent with Grade II diastolic dysfunction (pseudonormalization). 2. Right ventricular systolic function is normal. The right ventricular size is normal. There is mildly elevated pulmonary artery systolic pressure. 3. Left atrial size was mildly dilated. 4. Mild mitral valve regurgitation. 5. Tricuspid valve regurgitation is mild to moderate. 6. The aortic valve  is abnormal. Aortic valve regurgitation is mild to moderate. Mild aortic valve sclerosis is present, with no evidence of aortic valve stenosis. 7. The inferior vena cava is normal in size with greater than 50% respiratory variability, suggesting right atrial pressure of 3 mmHg.    Assessment/Plan:  1. SVT: -Successfully treated with adenosine  -Initially started on Torpol 12.5 however this was discontinued due to bradycardia with HR's in the 40-50's  -Echo with LVEF at 55-60%, G2DD, mild MR, mild to moderate TR, mid to moderate AR with mild sclerosis and no stenosis  -Plan per Dr. Diona Browner was to transition to pindolol 2.5mg  BID and follow in the OP setting with close OV and 72-H Zio monitor  -Telemetry with no recurrent SVT  2. Elevated hsT: -Flat pattern  with peak at 576 consistent with demand ischemia rather than ACS -No chest pain reported  -Stable echocardiogram as above    SVT (supraventricular tachycardia) (HCC): Pt has been started on responded well with metoprolol. TFT check. 07/12/20 Pindolol per Cardiology 2.5 bid. And hold if HR is 55 and below. 07/13/20 Pt started on pindolol and pt is stable no bradycardia reported. ZIO patch per cardiology.  GERD (gastroesophageal reflux disease) IV PPI. carafate and zofran. ?esophagitis. gastritis. 07/13/20 Cont ppi therapy. Pt to be d/c on po ppi.  COPD (chronic obstructive pulmonary disease) (HCC) Stable cont duoneb as needed.  Elevated troponin Attribute to demand ischemia. Cardiology following. 07/13/20 Appreciate cardiology Management and mare.  Anemia: Hemoglobin has dropped from 12 to 10.2 We Mccoy obtain iron panel c/w ACD and Mccoy transfuse as needed. H/h is stable at 11.0  Chronic Gi Bleed: FOBT is positive. D/w daughter for routine h/h and monitor for dark black stools and any dizziness or  Gross bleeding. She verbalized understanding the limitation of eval for mom and wants her to live life  and be comfortable.   B12 def: Level 183 pt given 1000 mcg of cyanocobalamin.   FOBT positive: D/w daughter that this is chronic and is probably contributing and causing strain on her heart and we Mccoy start ppi therapy. Pt is not a good candidate for gi eval due to risk with sedation and age.   Current medicines are reviewed with the patient today.  The patient does not have concerns regarding medicines other than what has been noted above.  The following changes have been made:  See above.  Labs/ tests ordered today include:   No orders of the defined types were placed in this encounter.    Disposition:   FU with *** in {gen number [id number]} {Days to years:10300}.   Patient is agreeable to this plan and Mccoy call if any problems develop in the interim.   SignedNorma Fredrickson, NP  07/21/2020 7:45 AM  [facility] [address] [place], [place]  [address] Phone: [phone] Fax: [phone]

## 2020-08-04 ENCOUNTER — Ambulatory Visit: Payer: Medicare Other | Admitting: Nurse Practitioner

## 2020-08-27 NOTE — Progress Notes (Signed)
CARDIOLOGY OFFICE NOTE  Date:  09/10/2020    [name] Date of Birth: Feb 13, 1929 Medical Record #[id number]  PCP:  Ailene Ravel, MD  Cardiologist: Excell Seltzer (NEW)   Chief Complaint  Patient presents with  . Follow-up  . Hospitalization Follow-up    Seen for Dr. Excell Seltzer    History of Present Illness: April Mccoy is a 86 y.o. female who presents today for a post hospital visit. Seen for Dr. Excell Seltzer (NEW).   She was admitted early in November of last year with SVT - noted difficult historian. Presented by EMS with episodes of vomiting - found to be in SVT. Given IV adenosine and converted back to NSR.   Comes in today. Here with her daughter today. She augments the history. Says her blood pressure is running low - not really able to tell me the numbers - sounds like the readings go to someone for review. She is not really sure what the BP is. But using a regular size cuff. Daughter seems quite overwhelmed with taking care of her mother. Has 2 brothers that don't give much help. Ms. Depaoli is demented. Daughter gave her a baby doll that has brought great joy to her mom. She is pretty much sedentary.   Past Medical History:  Diagnosis Date  . Abnormality of gait   . Anemia   . Bowel obstruction (HCC)   . COPD (chronic obstructive pulmonary disease) (HCC)   . Depression   . Fracture of coccyx (HCC)    history  . GERD (gastroesophageal reflux disease)   . Hard of hearing   . Hypertension   . Left shoulder pain    DJD  . Shortness of breath dyspnea   . Stroke [facility])    with h/o dysphagia post stroke  . SVT (supraventricular tachycardia) (HCC) 07/2020  . Thyroid disease   . Tobacco abuse   . UTI (lower urinary tract infection)    Enterobacter aerogenes  . Wheelchair bound     Past Surgical History:  Procedure Laterality Date  . ABDOMINAL HYSTERECTOMY    . BALLOON DILATION N/A 01/28/2015   Procedure: BALLOON DILATION;  Surgeon: Beverley Fiedler, MD;  Location: [facility]  ENDOSCOPY;  Service: Endoscopy;  Laterality: N/A;  . CATARACT EXTRACTION    . COLONOSCOPY WITH PROPOFOL N/A 10/21/2015   Procedure: COLONOSCOPY WITH PROPOFOL;  Surgeon: Sherrilyn Rist, MD;  Location: [facility] ENDOSCOPY;  Service: Gastroenterology;  Laterality: N/A;  . ESOPHAGOGASTRODUODENOSCOPY N/A 01/28/2015   Procedure: ESOPHAGOGASTRODUODENOSCOPY (EGD);  Surgeon: Beverley Fiedler, MD;  Location: [facility] ENDOSCOPY;  Service: Endoscopy;  Laterality: N/A;  . ESOPHAGOGASTRODUODENOSCOPY (EGD) WITH PROPOFOL N/A 10/21/2015   Procedure: ESOPHAGOGASTRODUODENOSCOPY (EGD) WITH PROPOFOL;  Surgeon: Sherrilyn Rist, MD;  Location: [facility] ENDOSCOPY;  Service: Gastroenterology;  Laterality: N/A;  . GASTRECTOMY  In her 40s versus 50s.   for ulcer.  No details as to the extent of surgery.  Marland Kitchen HERNIA REPAIR    . HIP FRACTURE SURGERY    . JOINT REPLACEMENT     b/l hips     Medications: Current Meds  Medication Sig  . acetaminophen (TYLENOL) 325 MG tablet Take 325 mg by mouth every 6 (six) hours as needed for moderate pain.  Marland Kitchen albuterol (PROVENTIL) (2.5 MG/3ML) 0.083% nebulizer solution Take 3 mLs (2.5 mg total) by nebulization every 6 (six) hours as needed for wheezing or shortness of breath.  Marland Kitchen aspirin EC 81 MG tablet Take 1 tablet (81 mg total)  by mouth daily.  . calcium carbonate (OS-CAL - DOSED IN MG OF ELEMENTAL CALCIUM) 1250 MG tablet Take 1 tablet (500 mg of elemental calcium total) by mouth 2 (two) times daily with a meal.  . escitalopram (LEXAPRO) 10 MG tablet Take 10 mg by mouth daily.   . ferrous sulfate 325 (65 FE) MG tablet Take 325 mg by mouth daily with breakfast.  . ipratropium-albuterol (DUONEB) 0.5-2.5 (3) MG/3ML SOLN Take 3 mLs by nebulization 2 (two) times daily as needed (for breathing).   Marland Kitchen levothyroxine (SYNTHROID) 50 MCG tablet Take 50 mcg by mouth daily.  . ondansetron (ZOFRAN) 4 MG tablet Take 4 mg by mouth every 8 (eight) hours as needed for nausea or vomiting.   . pantoprazole (PROTONIX) 40 MG  tablet Take 40 mg by mouth daily.  . pindolol (VISKEN) 5 MG tablet Take 0.5 tablets (2.5 mg total) by mouth 2 (two) times daily.  Marland Kitchen Propylene Glycol 0.6 % SOLN Apply 1 drop to eye daily as needed (for dry eyes).   . protective barrier (RESTORE) CREA Apply 1 application topically every other day.      Allergies: No Known Allergies  Social History: The patient  reports that she quit smoking about 6 years ago. Her smoking use included cigarettes. She has quit using smokeless tobacco. She reports that she does not drink alcohol and does not use drugs.   Family History: The patient's family history includes Alcohol abuse in her father; COPD in her son; Cancer in an other family member; Diabetes in her son; Heart Problems in her daughter; Heart disease in her mother; Hypertension in her mother.   Review of Systems: Please see the history of present illness.   All other systems are reviewed and negative.   Physical Exam: VS:  BP 130/78   Pulse (!) 59   Ht 4\' 11"  (1.499 m)   Wt 89 lb (40.4 kg) Comment: PTS DAUGHTER STATED WEIGHT  SpO2 92%   BMI 17.98 kg/m  .  BMI Body mass index is 17.98 kg/m.  Wt Readings from Last 3 Encounters:  09/10/20 89 lb (40.4 kg)  07/14/20 92 lb 6 oz (41.9 kg)  01/27/19 87 lb 1.3 oz (39.5 kg)    General: Elderly. She is demented. She is markedly thin.    Cardiac: Regular rate and rhythm. No murmurs, rubs, or gallops. No edema.  Respiratory:  Lungs are fairly clear to auscultation bilaterally with normal work of breathing.  MS: No deformity or atrophy. Gait not tested.  Skin: Warm and dry. Color is normal.  Neuro:  Strength and sensation are intact and no gross focal deficits noted.  Psych: Alert, appropriate and with normal affect.   LABORATORY DATA:  EKG:  EKG is ordered today.  Personally reviewed by me. This demonstrates sinus bradycardia - HR was 59.  Lab Results  Component Value Date   WBC 5.3 07/13/2020   HGB 11.0 (L) 07/13/2020   HCT 36.2  07/13/2020   PLT 184 07/13/2020   GLUCOSE 98 07/13/2020   CHOL 192 08/13/2014   TRIG 133 08/13/2014   HDL 41 08/13/2014   LDLCALC 124 (H) 08/13/2014   ALT 14 07/12/2020   AST 19 07/12/2020   NA 139 07/13/2020   K 4.3 07/13/2020   CL 104 07/13/2020   CREATININE 0.79 07/13/2020   BUN 13 07/13/2020   CO2 26 07/13/2020   TSH 1.329 07/11/2020   INR 0.98 10/20/2015   HGBA1C 5.9 (H) 08/13/2014  BNP (last 3 results) No results for input(s): BNP in the last 8760 hours.  ProBNP (last 3 results) No results for input(s): PROBNP in the last 8760 hours.   Other Studies Reviewed Today:  Echo 07/11/20:   1. Left ventricular ejection fraction, by estimation, is 55 to 60%. The  left ventricle has normal function. The left ventricle has no regional  wall motion abnormalities. Left ventricular diastolic parameters are  consistent with Grade II diastolic  dysfunction (pseudonormalization).   2. Right ventricular systolic function is normal. The right ventricular  size is normal. There is mildly elevated pulmonary artery systolic  pressure.   3. Left atrial size was mildly dilated.   4. Mild mitral valve regurgitation.   5. Tricuspid valve regurgitation is mild to moderate.   6. The aortic valve is abnormal. Aortic valve regurgitation is mild to  moderate. Mild aortic valve sclerosis is present, with no evidence of  aortic valve stenosis.   7. The inferior vena cava is normal in size with greater than 50%  respiratory variability, suggesting right atrial pressure of 3 mmHg.      Assessment and Plan:   1. SVT - lone episode - on very low dose beta blocker.   2. Prior elevated troponin - would not favor a work up.   3. Demented - would favor supportive care.   Overall, stable from our standpoint - would favor if going to check her BP - to use a pediatric cuff - would be more accurate.      Current medicines are reviewed with the patient today.  The patient does not have  concerns regarding medicines other than what has been noted above.  The following changes have been made:  See above.  Labs/ tests ordered today include:    Orders Placed This Encounter  Procedures  . EKG 12-Lead     Disposition:   FU with [place] as needed.    Patient is agreeable to this plan and will call if any problems develop in the interim.   SignedNorma Fredrickson, NP  09/10/2020 12:25 PM  [facility] [address] [place], [place]  [address] Phone: [phone] Fax: [phone]

## 2020-09-10 ENCOUNTER — Ambulatory Visit (INDEPENDENT_AMBULATORY_CARE_PROVIDER_SITE_OTHER): Payer: Medicare Other | Admitting: Nurse Practitioner

## 2020-09-10 ENCOUNTER — Other Ambulatory Visit: Payer: Self-pay

## 2020-09-10 ENCOUNTER — Encounter: Payer: Self-pay | Admitting: Nurse Practitioner

## 2020-09-10 VITALS — BP 130/78 | HR 59 | Ht 59.0 in | Wt 89.0 lb

## 2020-09-10 DIAGNOSIS — I471 Supraventricular tachycardia: Secondary | ICD-10-CM

## 2020-09-10 NOTE — Patient Instructions (Signed)
After Visit Summary:  We will be checking the following labs today - NONE   Medication Instructions:    Continue with your current medicines.    If you need a refill on your cardiac medications before your next appointment, please call your pharmacy.     Testing/Procedures To Be Arranged:  N/A  Follow-Up:   See [place] back as needed.     At [facility], you and your health needs are our priority.  As part of our continuing mission to provide you with exceptional heart care, we have created designated Provider Care Teams.  These Care Teams include your primary Cardiologist (physician) and Advanced Practice Providers (APPs -  Physician Assistants and Nurse Practitioners) who all work together to provide you with the care you need, when you need it.  Special Instructions:  . Stay safe, wash your hands for at least 20 seconds and wear a mask when needed.  . It was good to talk with you today.  . Ask the insurance company to send you a pediatric BP cuff.    Call the [facility] office at [phone] if you have any questions, problems or concerns.

## 2020-09-23 ENCOUNTER — Other Ambulatory Visit: Payer: Self-pay | Admitting: Nurse Practitioner

## 2020-11-27 ENCOUNTER — Other Ambulatory Visit: Payer: Self-pay

## 2020-11-27 ENCOUNTER — Emergency Department (HOSPITAL_COMMUNITY): Payer: Medicare Other

## 2020-11-27 ENCOUNTER — Emergency Department (HOSPITAL_COMMUNITY)
Admission: EM | Admit: 2020-11-27 | Discharge: 2020-11-27 | Disposition: A | Discharge status: Home / Self Care | Payer: Medicare Other | Attending: Emergency Medicine | Admitting: Emergency Medicine

## 2020-11-27 DIAGNOSIS — Z7982 Long term (current) use of aspirin: Secondary | ICD-10-CM | POA: Diagnosis not present

## 2020-11-27 DIAGNOSIS — R0602 Shortness of breath: Secondary | ICD-10-CM | POA: Insufficient documentation

## 2020-11-27 DIAGNOSIS — Z96641 Presence of right artificial hip joint: Secondary | ICD-10-CM | POA: Diagnosis not present

## 2020-11-27 DIAGNOSIS — Z79899 Other long term (current) drug therapy: Secondary | ICD-10-CM | POA: Diagnosis not present

## 2020-11-27 DIAGNOSIS — J449 Chronic obstructive pulmonary disease, unspecified: Secondary | ICD-10-CM | POA: Insufficient documentation

## 2020-11-27 DIAGNOSIS — I10 Essential (primary) hypertension: Secondary | ICD-10-CM | POA: Diagnosis not present

## 2020-11-27 DIAGNOSIS — Z96642 Presence of left artificial hip joint: Secondary | ICD-10-CM | POA: Diagnosis not present

## 2020-11-27 DIAGNOSIS — R1310 Dysphagia, unspecified: Secondary | ICD-10-CM | POA: Insufficient documentation

## 2020-11-27 DIAGNOSIS — Z87891 Personal history of nicotine dependence: Secondary | ICD-10-CM | POA: Insufficient documentation

## 2020-11-27 DIAGNOSIS — R059 Cough, unspecified: Secondary | ICD-10-CM | POA: Insufficient documentation

## 2020-11-27 LAB — BASIC METABOLIC PANEL
Anion gap: 5 (ref 5–15)
BUN: 20 mg/dL (ref 8–23)
CO2: 27 mmol/L (ref 22–32)
Calcium: 10 mg/dL (ref 8.9–10.3)
Chloride: 108 mmol/L (ref 98–111)
Creatinine, Ser: 0.79 mg/dL (ref 0.44–1.00)
GFR, Estimated: >60 mL/min (ref >60)
Glucose, Bld: 113 mg/dL — ABNORMAL HIGH (ref 70–99)
Potassium: 4.1 mmol/L (ref 3.5–5.1)
Sodium: 140 mmol/L (ref 135–145)

## 2020-11-27 LAB — CBC
HCT: 38.9 % (ref 36.0–46.0)
Hemoglobin: 11.7 g/dL — ABNORMAL LOW (ref 12.0–15.0)
MCH: 27.5 pg (ref 26.0–34.0)
MCHC: 30.1 g/dL (ref 30.0–36.0)
MCV: 91.3 fL (ref 80.0–100.0)
Platelets: 191 10*3/uL (ref 150–400)
RBC: 4.26 MIL/uL (ref 3.87–5.11)
RDW: 13.4 % (ref 11.5–15.5)
WBC: 5.7 10*3/uL (ref 4.0–10.5)
nRBC: 0 % (ref 0.0–0.2)

## 2020-11-27 NOTE — ED Triage Notes (Signed)
Pt from home BIB EMS for c/o SOB/dyspnea that resolved after coughing up some white phlegm. Denies having any associated CP. Denies SOB/dyspnea on arrival.

## 2020-11-27 NOTE — Discharge Instructions (Signed)
Your testing shows no pneumonia, your blood work was reassuring  Please continue to work with your social worker on finding placement

## 2020-11-27 NOTE — ED Provider Notes (Signed)
[facility]   CSN: [id number] Arrival date & time: 11/27/20  1544     History Chief Complaint  Patient presents with  . Shortness of Breath    April Mccoy is a 87 y.o. female.  HPI   This patient is an elderly 87 year old female, she has a known history of COPD, she has hypertension and is wheelchair bound living at home with family.  She does have some dysphagia after having a stroke.  Review of the medical record shows that the patient has not been to the hospital in the last several months, she has done well but returns today at the request of family members who called the paramedics because the patient was having some difficulty breathing.  As the paramedics arrived to the patient was able to cough up a large conglomerate of mucoid material which made her feel immediately better and her breathing normalized according to the paramedics.  They noted unremarkable vital signs and transported the patient in the hospital at the family's request.  This patient is not able to give me much other information other than stating that she was really short of breath with cough something up and now she feels better.  There is been no reports of fevers nausea vomiting or diarrhea, the patient has no complaints of pain anywhere.  Symptoms were intermittent, resolved, no associated symptoms, no medications given prehospital  Past Medical History:  Diagnosis Date  . Abnormality of gait   . Anemia   . Bowel obstruction (HCC)   . COPD (chronic obstructive pulmonary disease) (HCC)   . Depression   . Fracture of coccyx (HCC)    history  . GERD (gastroesophageal reflux disease)   . Hard of hearing   . Hypertension   . Left shoulder pain    DJD  . Shortness of breath dyspnea   . Stroke [facility](HCC)    with h/o dysphagia post stroke  . SVT (supraventricular tachycardia) (HCC) 07/2020  . Thyroid disease   . Tobacco abuse   . UTI (lower urinary  tract infection)    Enterobacter aerogenes  . Wheelchair bound     Patient Active Problem List   Diagnosis Date Noted  . SVT (supraventricular tachycardia) (HCC)   . Elevated troponin   . Iron deficiency anemia 10/20/2015  . Panlobular emphysema (HCC)   . Esophageal ulcer 01/29/2015  . Duodenal ulcer 01/29/2015  . Esophageal dysmotility 01/29/2015  . Esophageal ulceration   . Hypoxia 01/25/2015  . Nausea and vomiting 01/25/2015  . Hypoxemia 01/25/2015  . Fracture of multiple ribs 08/14/2014  . Osteopenia 08/14/2014  . Chest pain 08/13/2014  . GERD (gastroesophageal reflux disease) 08/13/2014  . Anemia 08/13/2014  . Depression 08/13/2014  . History of stroke 08/13/2014  . Hypertension 08/13/2014  . Thyroid disease 08/13/2014  . COPD (chronic obstructive pulmonary disease) (HCC) 08/13/2014  . Dysphagia 08/13/2014  . Right flank pain 08/13/2014  . Mid back pain 08/13/2014  . Abnormality of gait 08/13/2014  . Tobacco abuse 08/13/2014    Past Surgical History:  Procedure Laterality Date  . ABDOMINAL HYSTERECTOMY    . BALLOON DILATION N/A 01/28/2015   Procedure: BALLOON DILATION;  Surgeon: Beverley FiedlerJay M Pyrtle, MD;  Location: [facility] ENDOSCOPY;  Service: Endoscopy;  Laterality: N/A;  . CATARACT EXTRACTION    . COLONOSCOPY WITH PROPOFOL N/A 10/21/2015   Procedure: COLONOSCOPY WITH PROPOFOL;  Surgeon: Sherrilyn RistHenry L Danis III, MD;  Location: [facility] ENDOSCOPY;  Service: Gastroenterology;  Laterality: N/A;  . ESOPHAGOGASTRODUODENOSCOPY N/A 01/28/2015   Procedure: ESOPHAGOGASTRODUODENOSCOPY (EGD);  Surgeon: Beverley Fiedler, MD;  Location: [facility] ENDOSCOPY;  Service: Endoscopy;  Laterality: N/A;  . ESOPHAGOGASTRODUODENOSCOPY (EGD) WITH PROPOFOL N/A 10/21/2015   Procedure: ESOPHAGOGASTRODUODENOSCOPY (EGD) WITH PROPOFOL;  Surgeon: Sherrilyn Rist, MD;  Location: [facility] ENDOSCOPY;  Service: Gastroenterology;  Laterality: N/A;  . GASTRECTOMY  In her 40s versus 50s.   for ulcer.  No details as to the extent of surgery.   Marland Kitchen HERNIA REPAIR    . HIP FRACTURE SURGERY    . JOINT REPLACEMENT     b/l hips     OB History   No obstetric history on file.     Family History  Problem Relation Age of Onset  . Heart disease Mother        died age 39 MI  . Hypertension Mother   . Alcohol abuse Father   . Cancer Other        sister, 1 brother (lung) 1 brother (colon), daughter x 2 deceased 1 died age 19 pancreatitic another died age 10 lung  . Diabetes Son   . COPD Son   . Heart Problems Daughter     Social History   Tobacco Use  . Smoking status: Former Smoker    Types: Cigarettes    Quit date: 05/26/2014    Years since quitting: 6.5  . Smokeless tobacco: Former Clinical biochemist  . Vaping Use: Never used  Substance Use Topics  . Alcohol use: No    Alcohol/week: 0.0 standard drinks  . Drug use: No    Home Medications Prior to Admission medications   Medication Sig Start Date End Date Taking? Authorizing Provider  acetaminophen (TYLENOL) 325 MG tablet Take 325 mg by mouth every 6 (six) hours as needed for moderate pain.    [provider]  albuterol (PROVENTIL) (2.5 MG/3ML) 0.083% nebulizer solution Take 3 mLs (2.5 mg total) by nebulization every 6 (six) hours as needed for wheezing or shortness of breath. 01/29/15   Marinda Elk, MD  aspirin EC 81 MG tablet Take 1 tablet (81 mg total) by mouth daily. 02/12/15   Marinda Elk, MD  calcium carbonate (OS-CAL - DOSED IN MG OF ELEMENTAL CALCIUM) 1250 MG tablet Take 1 tablet (500 mg of elemental calcium total) by mouth 2 (two) times daily with a meal. 08/14/14   Marrian Salvage, MD  escitalopram (LEXAPRO) 10 MG tablet Take 10 mg by mouth daily.     [provider]  ferrous sulfate 325 (65 FE) MG tablet Take 325 mg by mouth daily with breakfast.    [provider]  ipratropium-albuterol (DUONEB) 0.5-2.5 (3) MG/3ML SOLN Take 3 mLs by nebulization 2 (two) times daily as needed (for breathing).     [provider]  levothyroxine (SYNTHROID) 50 MCG tablet Take 50 mcg by mouth daily. 06/07/20   [provider]  ondansetron (ZOFRAN) 4 MG tablet Take 4 mg by mouth every 8 (eight) hours as needed for nausea or vomiting.  10/06/15   [provider]  pantoprazole (PROTONIX) 40 MG tablet Take 40 mg by mouth daily.    [provider]  pindolol (VISKEN) 5 MG tablet TAKE 1/2 TABLET (2.5MG ) BY MOUTH TWICE DAILY 09/23/20   Allred, Fayrene Fearing, MD  Propylene Glycol 0.6 % SOLN Apply 1 drop to eye daily as needed (for dry eyes).     [provider]  protective barrier (RESTORE) CREA Apply  1 application topically every other day.     [provider]    Allergies    Patient has no known allergies.  Review of Systems   Review of Systems  All other systems reviewed and are negative.   Physical Exam Updated Vital Signs BP (!) 152/73   Pulse 63   Temp 98 F (36.7 C) (Oral)   Resp 12   SpO2 90%   Physical Exam Vitals and nursing note reviewed.  Constitutional:      General: She is not in acute distress.    Appearance: She is well-developed.  HENT:     Head: Normocephalic and atraumatic.     Mouth/Throat:     Pharynx: No oropharyngeal exudate.  Eyes:     General: No scleral icterus.       Right eye: No discharge.        Left eye: No discharge.     Conjunctiva/sclera: Conjunctivae normal.     Pupils: Pupils are equal, round, and reactive to light.  Neck:     Thyroid: No thyromegaly.     Vascular: No JVD.  Cardiovascular:     Rate and Rhythm: Normal rate and regular rhythm.     Heart sounds: Normal heart sounds. No murmur heard. No friction rub. No gallop.   Pulmonary:     Effort: Pulmonary effort is normal. No respiratory distress.     Breath sounds: Normal breath sounds. No wheezing or rales.     Comments: This patient is able to speak without dyspnea, she does not have any accessory muscle use, she speaks in full sentences, comfortable respiratory  pattern Abdominal:     General: Bowel sounds are normal. There is no distension.     Palpations: Abdomen is soft. There is no mass.     Tenderness: There is no abdominal tenderness.  Musculoskeletal:        General: No tenderness. Normal range of motion.     Cervical back: Normal range of motion and neck supple.  Lymphadenopathy:     Cervical: No cervical adenopathy.  Skin:    General: Skin is warm and dry.     Findings: No erythema or rash.  Neurological:     Mental Status: She is alert.     Coordination: Coordination normal.  Psychiatric:        Behavior: Behavior normal.     ED Results / Procedures / Treatments   Labs (all labs ordered are listed, but only abnormal results are displayed) Labs Reviewed  CBC - Abnormal; Notable for the following components:      Result Value   Hemoglobin 11.7 (*)    All other components within normal limits  BASIC METABOLIC PANEL - Abnormal; Notable for the following components:   Glucose, Bld 113 (*)    All other components within normal limits    EKG None  Radiology DG Chest Port 1 View  Result Date: 11/27/2020 CLINICAL DATA:  Shortness of breath EXAM: PORTABLE CHEST 1 VIEW COMPARISON:  07/10/2020 FINDINGS: Cardiac shadow is stable. Aortic calcifications are again seen. Lungs are hyperinflated but clear. No acute bony abnormality is seen. IMPRESSION: No acute abnormality noted. Aortic Atherosclerosis (ICD10-I70.0). Electronically Signed   By: Alcide Clever M.D.   On: 11/27/2020 16:22    Procedures Procedures   Medications Ordered in ED Medications - No data to display  ED Course  I have reviewed the triage vital signs and the nursing notes.  Pertinent labs & imaging results that  were available during my care of the patient were reviewed by me and considered in my medical decision making (see chart for details).    MDM Rules/Calculators/A&P                          This patient has no edema, soft compartments, clear lungs,  no distress, vital signs are unremarkable, pulse of 65 pressure of 106/66, and oxygen levels are unremarkable.  This patient needs a chest x-ray, she needs labs, she otherwise appears well and this may have just been a mucous plug that she was able to clear.  Due to her history of dysphagia may need to rule out aspiration pneumonia by x-ray.  I discussed the case with the pt's daughter - coughing X 1 day with phlegm, has not been sick otherwise.  she states that she lives by herself and seh has no in home help so she is going to the house daily to help / cook and care for her - non sustainable - has had an incident of getting into the bathtub this week and couldn't get out - no injuries to this point.  Labs unremarkable   CXR without infiltrate  TOC consulted  I discussed the care with our transition of care team, the family member was contacted, the patient already has 4 hours of in-home care per day, they have a Child psychotherapist working with him as well.  At this time I feel comfortable sending the patient home, the family member has accepted this as well.  Stable for discharge, no pneumonia, labs unremarkable  Final Clinical Impression(s) / ED Diagnoses Final diagnoses:  Cough  SOB (shortness of breath)    Rx / DC Orders ED Discharge Orders    None       Eber Hong, MD 11/27/20 1939

## 2020-11-27 NOTE — ED Notes (Signed)
Patient verbalizes understanding of discharge instructions. Opportunity for questioning and answers were provided. Armband removed by staff, pt discharged from ED via wheelchair.  

## 2020-11-27 NOTE — Progress Notes (Signed)
   11/27/20 1849  TOC ED Mini Assessment  TOC Time spent with patient (minutes): 20  PING Used in TOC Assessment No  Admission or Readmission Diverted Yes  Interventions which prevented an admission or readmission Other (must enter comment)  What brought you to the Emergency Department?  Patient was [facility] into [facility] ED today with c/o difficulty of breathing,  Patient's daughter states she  having difficulty continuing to care for her. Patient lives alone and daughter come over every to assist with ADL's.  RNCM met with daughter who reports that patient has CAPS services 4hrs per/day and Case Worker who has been assisting with trying to find a long care facilty.  RNCM  explained that patient is not meeting criteria for admission daughter verbalized understanding, RNCM suggested that patient's daughter get reach out to the case worker to find out what the status for longterm placement.  She is agreeable.   Updated EDP, patient will return with daughter.  Barriers to Discharge No Barriers Identified

## 2021-07-01 IMAGING — CT CT HEAD W/O CM
4 series · 17 of 47 positions shown, 19 images · non-contrast
Comparison: Head CT 04/09/2016

CLINICAL DATA: Vertigo, peripheral

EXAM:
CT HEAD WITHOUT CONTRAST
TECHNIQUE: Contiguous axial images were obtained from the base of the skull
through the vertex without intravenous contrast.

[Series 3: head wo · axial · 0.44mm/px · z∈[-140,-30]mm · 6 of 32 slices shown, 8 images]
[im 5/32  brain]
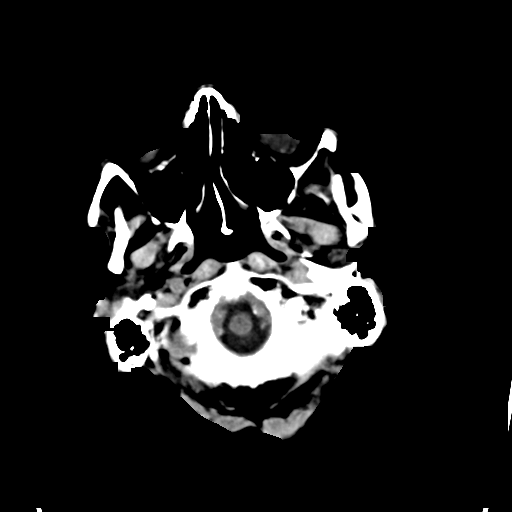
[im 5/32  bone]
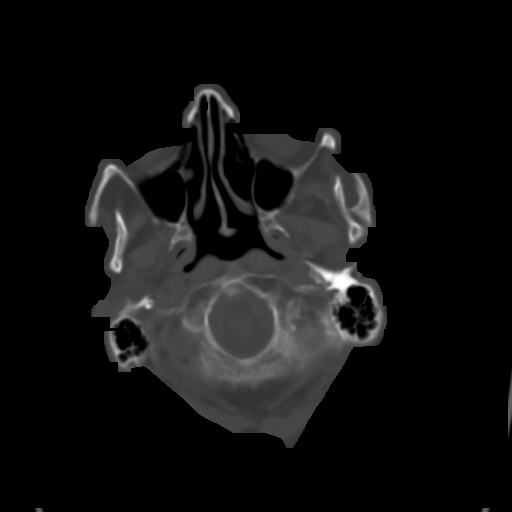
[im 9/32  brain]
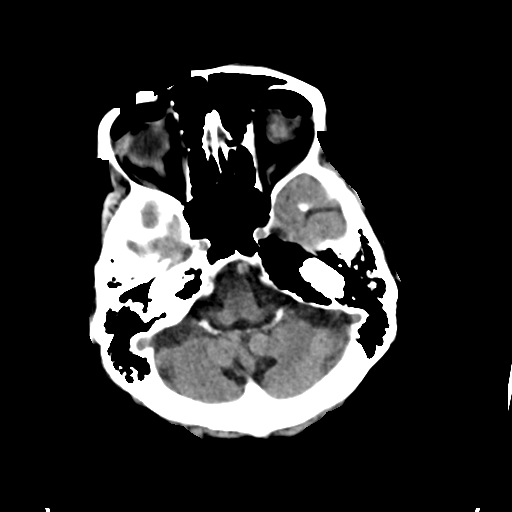
[im 14/32  brain]
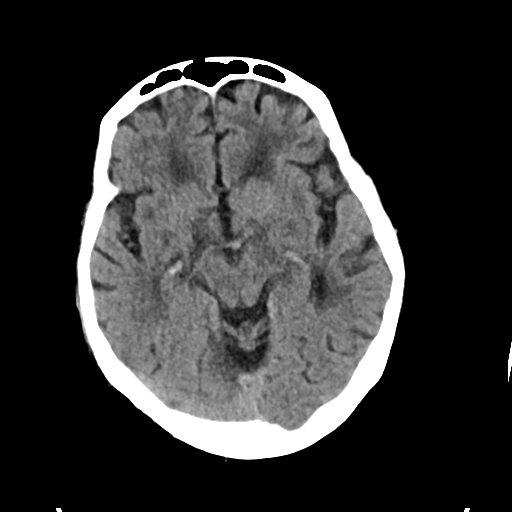
[im 18/32  brain]
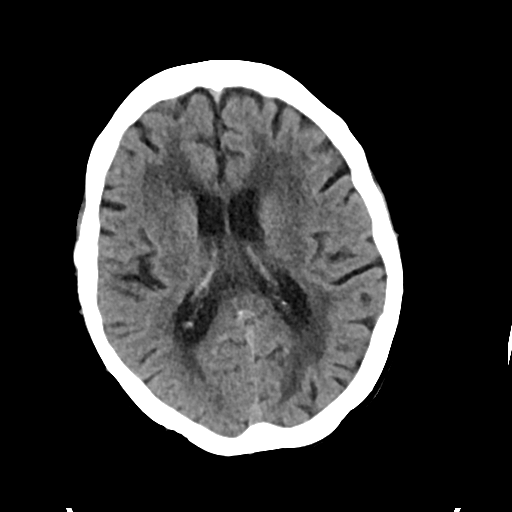
[im 23/32  brain]
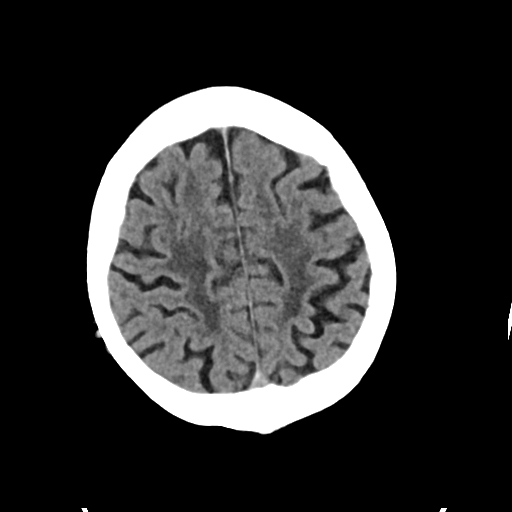
[im 23/32  bone]
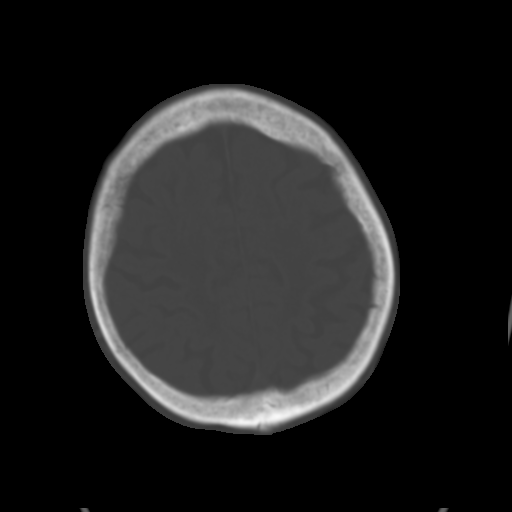
[im 27/32  brain]
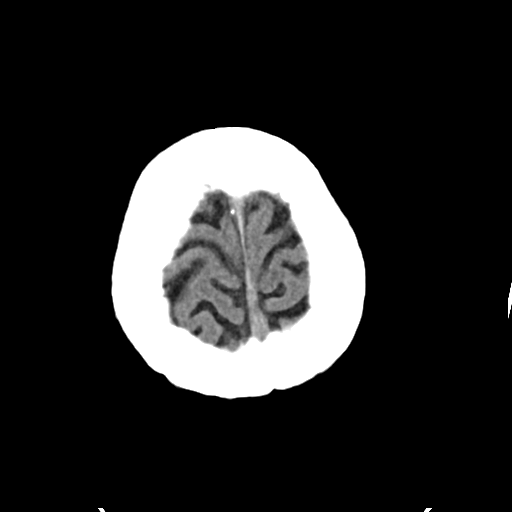

[Series 4: head bone · axial · 0.44mm/px · z∈[-146,-68]mm · 5 of 82 slices shown]
[im 8/82  bone]
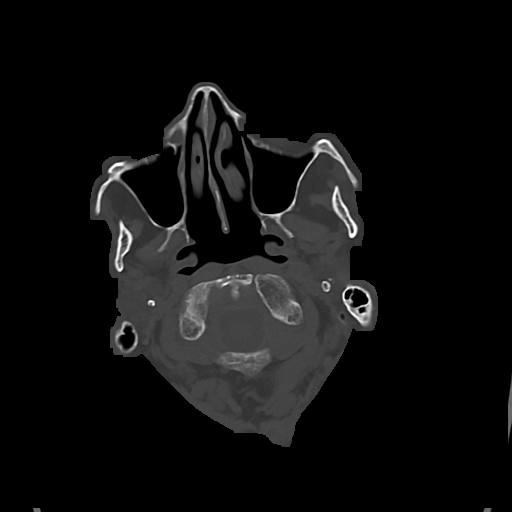
[im 16/82  bone]
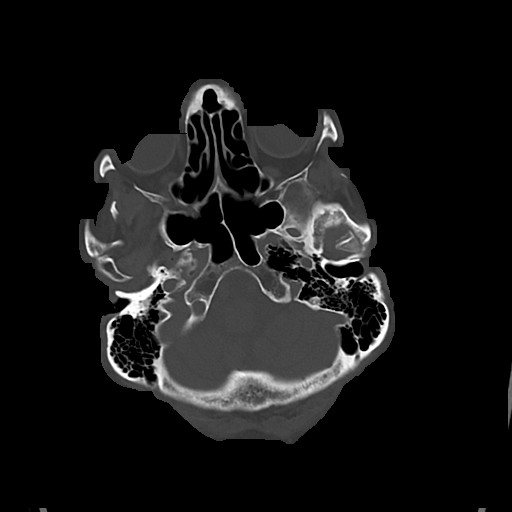
[im 28/82  bone]
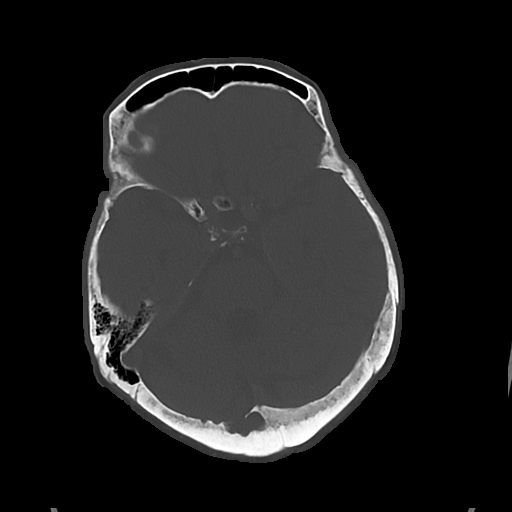
[im 35/82  bone]
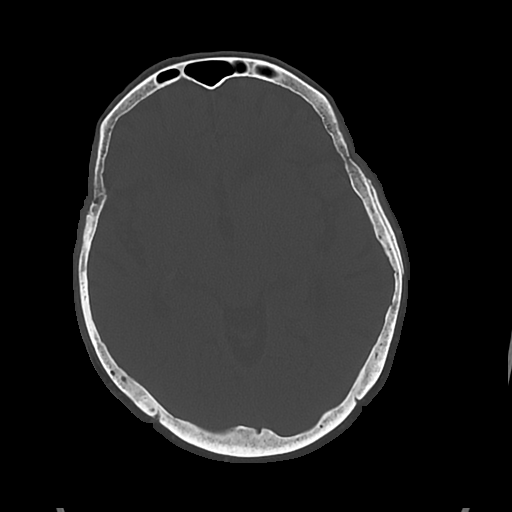
[im 47/82  bone]
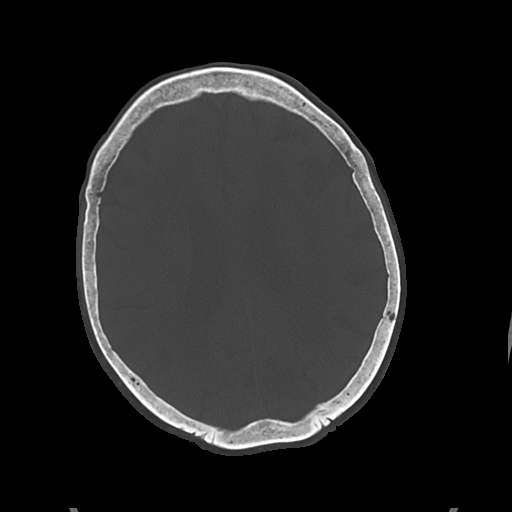

[Series 5: cor soft · coronal · 0.31mm/px · 3 of 66 slices shown]
[im 22/66  brain]
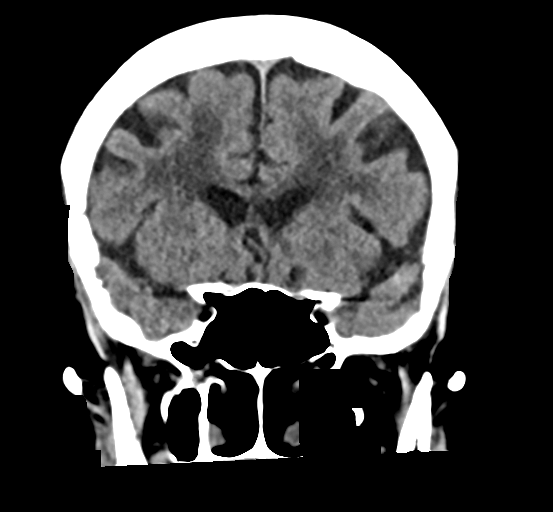
[im 29/66  brain]
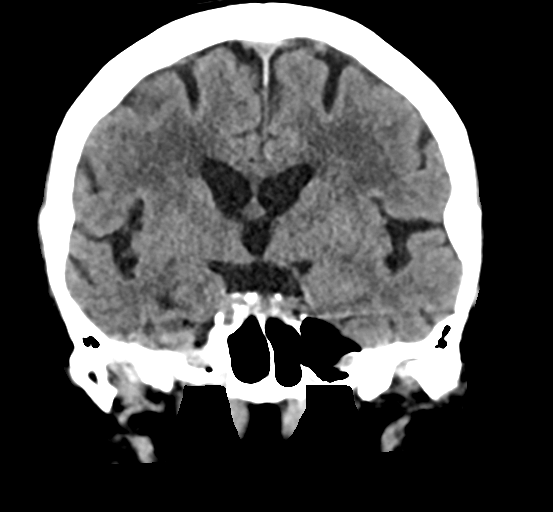
[im 37/66  brain]
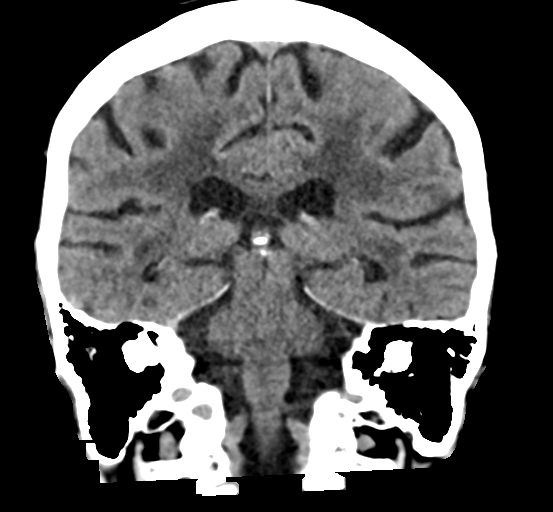

[Series 6: sag soft · sagittal · 0.31mm/px · 3 of 56 slices shown]
[im 19/56  brain]
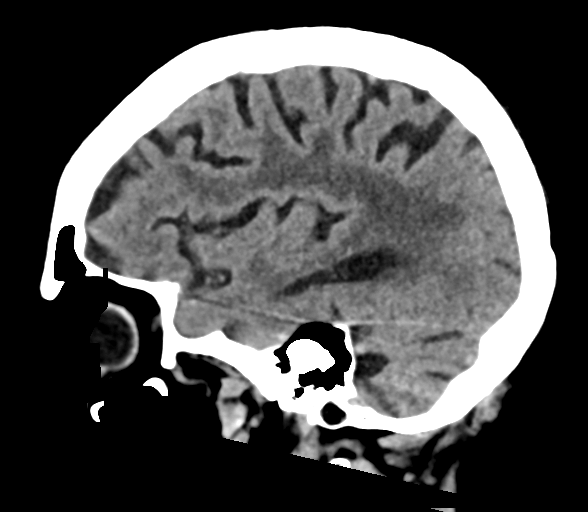
[im 28/56  brain]
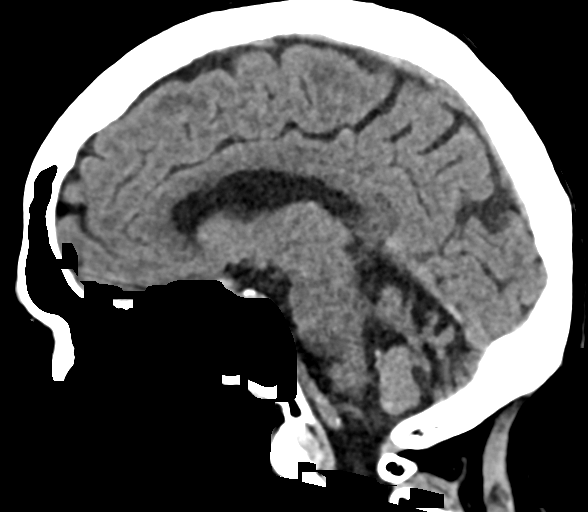
[im 37/56  brain]
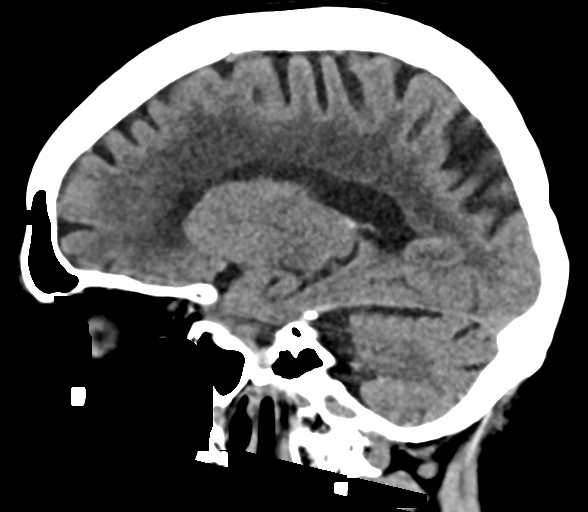

[17 of 47 positions shown; findings below may reference images not displayed]

FINDINGS: Brain: Generalized atrophy and chronic small vessel ischemia with
progression from 3297. Cerebellar atrophy is not significantly
changed. There is encephalomalacia in the left parietooccipital
cortex that was not seen on prior exam, likely represents interval
but remote infarct. There is no evidence of acute ischemia. No
hemorrhage. No subdural or extra-axial collection. No hydrocephalus,
midline shift, or mass effect.

Vascular: Atherosclerosis of skullbase vasculature without
hyperdense vessel or abnormal calcification.

Skull: No fracture or focal lesion.

Sinuses/Orbits: Paranasal sinuses and mastoid air cells are clear.
The visualized orbits are unremarkable. Bilateral cataract
resection.

Other: None.
IMPRESSION: 1. No acute intracranial abnormality.
2. Generalized atrophy and chronic small vessel ischemia with
progression from 3297. Remote left parietooccipital infarct.

## 2021-11-16 IMAGING — DX DG CHEST 1V PORT
1 series · 1 of 1 positions shown · non-contrast
Comparison: 07/10/2020

CLINICAL DATA: Shortness of breath

EXAM:
PORTABLE CHEST 1 VIEW

[chest ap]
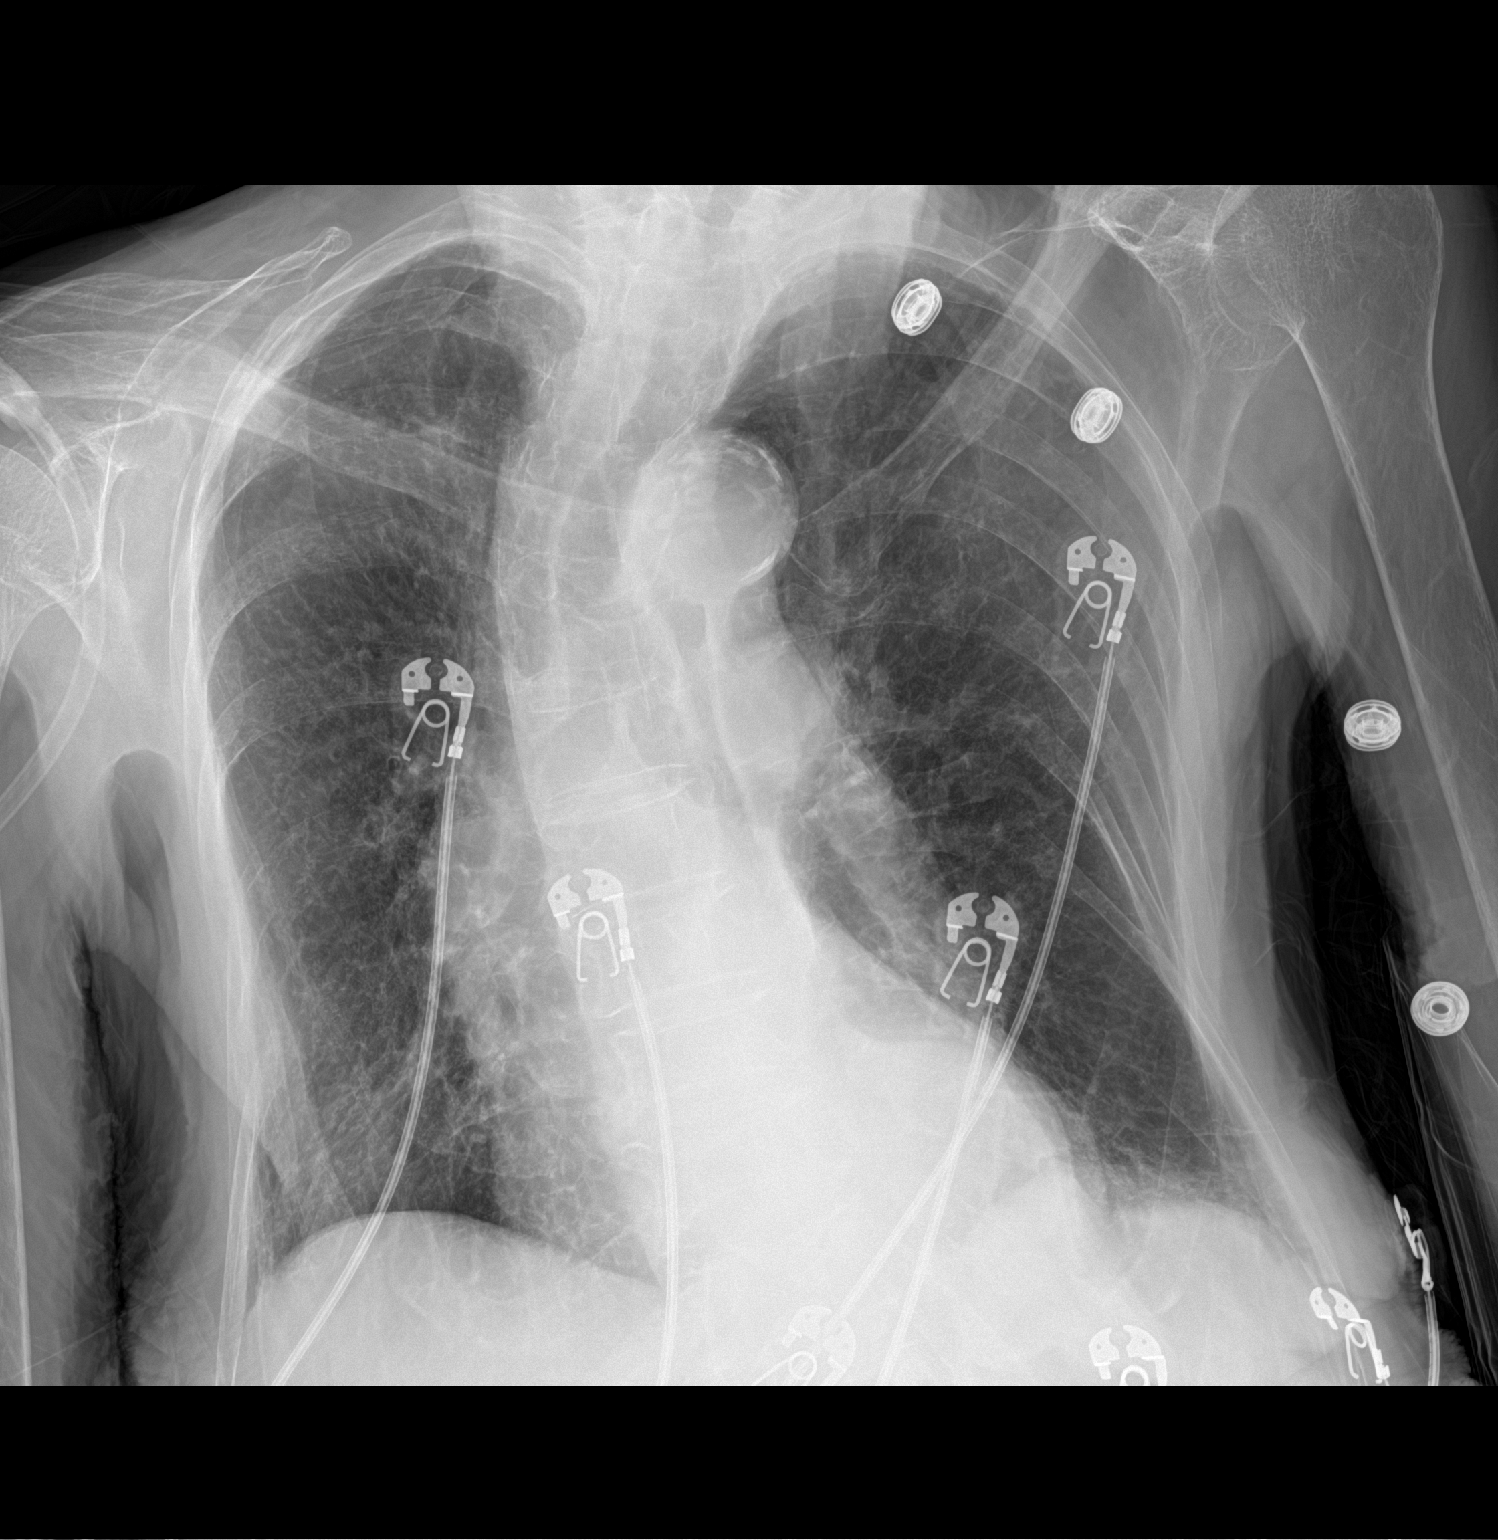

[1 of 1 positions shown; findings below may reference images not displayed]

FINDINGS: Cardiac shadow is stable. Aortic calcifications are again seen.
Lungs are hyperinflated but clear. No acute bony abnormality is
seen.
IMPRESSION: No acute abnormality noted.

Aortic Atherosclerosis ([id number]).

## 2022-06-21 DIAGNOSIS — E46 Unspecified protein-calorie malnutrition: Secondary | ICD-10-CM | POA: Diagnosis not present

## 2022-06-21 DIAGNOSIS — E039 Hypothyroidism, unspecified: Secondary | ICD-10-CM | POA: Diagnosis not present

## 2022-06-21 DIAGNOSIS — F03911 Unspecified dementia, unspecified severity, with agitation: Secondary | ICD-10-CM | POA: Diagnosis not present

## 2022-06-21 DIAGNOSIS — J449 Chronic obstructive pulmonary disease, unspecified: Secondary | ICD-10-CM | POA: Diagnosis not present

## 2022-06-29 DIAGNOSIS — R059 Cough, unspecified: Secondary | ICD-10-CM | POA: Diagnosis not present

## 2022-07-13 DIAGNOSIS — I6930 Unspecified sequelae of cerebral infarction: Secondary | ICD-10-CM | POA: Diagnosis not present

## 2022-07-13 DIAGNOSIS — R293 Abnormal posture: Secondary | ICD-10-CM | POA: Diagnosis not present

## 2022-07-14 DIAGNOSIS — R293 Abnormal posture: Secondary | ICD-10-CM | POA: Diagnosis not present

## 2022-07-14 DIAGNOSIS — I6930 Unspecified sequelae of cerebral infarction: Secondary | ICD-10-CM | POA: Diagnosis not present

## 2022-07-15 DIAGNOSIS — I6930 Unspecified sequelae of cerebral infarction: Secondary | ICD-10-CM | POA: Diagnosis not present

## 2022-07-15 DIAGNOSIS — R293 Abnormal posture: Secondary | ICD-10-CM | POA: Diagnosis not present

## 2022-07-16 DIAGNOSIS — I6930 Unspecified sequelae of cerebral infarction: Secondary | ICD-10-CM | POA: Diagnosis not present

## 2022-07-16 DIAGNOSIS — R293 Abnormal posture: Secondary | ICD-10-CM | POA: Diagnosis not present

## 2022-07-17 DIAGNOSIS — I6930 Unspecified sequelae of cerebral infarction: Secondary | ICD-10-CM | POA: Diagnosis not present

## 2022-07-17 DIAGNOSIS — R293 Abnormal posture: Secondary | ICD-10-CM | POA: Diagnosis not present

## 2022-07-20 DIAGNOSIS — I6930 Unspecified sequelae of cerebral infarction: Secondary | ICD-10-CM | POA: Diagnosis not present

## 2022-07-20 DIAGNOSIS — R293 Abnormal posture: Secondary | ICD-10-CM | POA: Diagnosis not present

## 2022-07-21 DIAGNOSIS — I6930 Unspecified sequelae of cerebral infarction: Secondary | ICD-10-CM | POA: Diagnosis not present

## 2022-07-21 DIAGNOSIS — R293 Abnormal posture: Secondary | ICD-10-CM | POA: Diagnosis not present

## 2022-07-22 DIAGNOSIS — F03911 Unspecified dementia, unspecified severity, with agitation: Secondary | ICD-10-CM | POA: Diagnosis not present

## 2022-07-22 DIAGNOSIS — J449 Chronic obstructive pulmonary disease, unspecified: Secondary | ICD-10-CM | POA: Diagnosis not present

## 2022-07-22 DIAGNOSIS — E46 Unspecified protein-calorie malnutrition: Secondary | ICD-10-CM | POA: Diagnosis not present

## 2022-07-22 DIAGNOSIS — E039 Hypothyroidism, unspecified: Secondary | ICD-10-CM | POA: Diagnosis not present

## 2022-07-23 DIAGNOSIS — R293 Abnormal posture: Secondary | ICD-10-CM | POA: Diagnosis not present

## 2022-07-23 DIAGNOSIS — I6930 Unspecified sequelae of cerebral infarction: Secondary | ICD-10-CM | POA: Diagnosis not present

## 2022-07-26 DIAGNOSIS — R293 Abnormal posture: Secondary | ICD-10-CM | POA: Diagnosis not present

## 2022-07-26 DIAGNOSIS — I6930 Unspecified sequelae of cerebral infarction: Secondary | ICD-10-CM | POA: Diagnosis not present

## 2022-07-27 DIAGNOSIS — R293 Abnormal posture: Secondary | ICD-10-CM | POA: Diagnosis not present

## 2022-07-27 DIAGNOSIS — I6930 Unspecified sequelae of cerebral infarction: Secondary | ICD-10-CM | POA: Diagnosis not present

## 2022-07-28 DIAGNOSIS — I6930 Unspecified sequelae of cerebral infarction: Secondary | ICD-10-CM | POA: Diagnosis not present

## 2022-07-28 DIAGNOSIS — R293 Abnormal posture: Secondary | ICD-10-CM | POA: Diagnosis not present

## 2022-08-01 DIAGNOSIS — R293 Abnormal posture: Secondary | ICD-10-CM | POA: Diagnosis not present

## 2022-08-01 DIAGNOSIS — I6930 Unspecified sequelae of cerebral infarction: Secondary | ICD-10-CM | POA: Diagnosis not present
